# Patient Record
Sex: Male | Born: 1935 | Race: White | Hispanic: No | Marital: Married | Smoking: Former smoker
Health system: Southern US, Community
[De-identification: ages and names within clinical notes are randomized; demographics above are authoritative.]

## PROBLEM LIST (undated history)

## (undated) DIAGNOSIS — E785 Hyperlipidemia, unspecified: Secondary | ICD-10-CM

## (undated) DIAGNOSIS — I509 Heart failure, unspecified: Secondary | ICD-10-CM

## (undated) DIAGNOSIS — E039 Hypothyroidism, unspecified: Secondary | ICD-10-CM

## (undated) DIAGNOSIS — I639 Cerebral infarction, unspecified: Secondary | ICD-10-CM

## (undated) DIAGNOSIS — F0390 Unspecified dementia without behavioral disturbance: Secondary | ICD-10-CM

## (undated) DIAGNOSIS — E079 Disorder of thyroid, unspecified: Secondary | ICD-10-CM

## (undated) DIAGNOSIS — N183 Chronic kidney disease, stage 3 unspecified: Secondary | ICD-10-CM

## (undated) DIAGNOSIS — N289 Disorder of kidney and ureter, unspecified: Secondary | ICD-10-CM

## (undated) DIAGNOSIS — I10 Essential (primary) hypertension: Secondary | ICD-10-CM

## (undated) DIAGNOSIS — I499 Cardiac arrhythmia, unspecified: Secondary | ICD-10-CM

## (undated) DIAGNOSIS — I4819 Other persistent atrial fibrillation: Secondary | ICD-10-CM

## (undated) DIAGNOSIS — I422 Other hypertrophic cardiomyopathy: Secondary | ICD-10-CM

## (undated) DIAGNOSIS — Z9289 Personal history of other medical treatment: Secondary | ICD-10-CM

## (undated) DIAGNOSIS — I5030 Unspecified diastolic (congestive) heart failure: Secondary | ICD-10-CM

## (undated) HISTORY — PX: HERNIA REPAIR: SHX51

---
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
## 2011-10-08 ENCOUNTER — Ambulatory Visit: Payer: Self-pay | Admitting: Surgery

## 2011-10-08 LAB — CBC WITH DIFFERENTIAL/PLATELET
Basophil #: 0.2 10*3/uL — ABNORMAL HIGH (ref 0.0–0.1)
Basophil %: 2.1 %
Eosinophil #: 0.2 10*3/uL (ref 0.0–0.7)
Eosinophil %: 1.6 %
HCT: 41.9 % (ref 40.0–52.0)
HGB: 14.4 g/dL (ref 13.0–18.0)
Lymphocyte #: 1 10*3/uL (ref 1.0–3.6)
Lymphocyte %: 9.1 %
MCV: 95 fL (ref 80–100)
Monocyte %: 6.8 %
Neutrophil #: 8.5 10*3/uL — ABNORMAL HIGH (ref 1.4–6.5)
RBC: 4.43 10*6/uL (ref 4.40–5.90)
RDW: 13.4 % (ref 11.5–14.5)
WBC: 10.6 10*3/uL (ref 3.8–10.6)

## 2011-10-14 ENCOUNTER — Ambulatory Visit: Payer: Self-pay | Admitting: Surgery

## 2012-11-27 ENCOUNTER — Ambulatory Visit: Payer: Self-pay | Admitting: Internal Medicine

## 2013-03-19 ENCOUNTER — Emergency Department: Payer: Self-pay | Admitting: Emergency Medicine

## 2013-03-19 LAB — COMPREHENSIVE METABOLIC PANEL
Alkaline Phosphatase: 77 U/L
Anion Gap: 7 (ref 7–16)
BUN: 15 mg/dL (ref 7–18)
Chloride: 89 mmol/L — ABNORMAL LOW (ref 98–107)
Co2: 27 mmol/L (ref 21–32)
Creatinine: 1.32 mg/dL — ABNORMAL HIGH (ref 0.60–1.30)
EGFR (African American): 59 — ABNORMAL LOW
EGFR (Non-African Amer.): 51 — ABNORMAL LOW
Glucose: 104 mg/dL — ABNORMAL HIGH (ref 65–99)
Osmolality: 249 (ref 275–301)
Sodium: 123 mmol/L — ABNORMAL LOW (ref 136–145)
Total Protein: 7.6 g/dL (ref 6.4–8.2)

## 2013-03-19 LAB — CBC
MCHC: 35.1 g/dL (ref 32.0–36.0)
MCV: 92 fL (ref 80–100)
Platelet: 193 10*3/uL (ref 150–440)
WBC: 6 10*3/uL (ref 3.8–10.6)

## 2013-09-01 ENCOUNTER — Emergency Department: Payer: Self-pay | Admitting: Internal Medicine

## 2013-10-07 DIAGNOSIS — E039 Hypothyroidism, unspecified: Secondary | ICD-10-CM | POA: Insufficient documentation

## 2014-07-12 NOTE — Op Note (Signed)
PATIENT NAME:  Robert Frye, Robert R MR#:  [id number] DATE OF BIRTH:  [birth year]  DATE OF PROCEDURE:  10/14/2011  PREOPERATIVE DIAGNOSIS: Left inguinal hernia.   POSTOPERATIVE DIAGNOSIS: Left inguinal hernia.   PROCEDURE: Left inguinal hernia repair.   SURGEON: Adella HareJ. Wilton Smith, MD  ANESTHESIA: General.   INDICATIONS: This 81 year old male recently has had bulging in the left groin. A left inguinal hernia was demonstrated on physical exam and repair recommended for definitive treatment.   DESCRIPTION OF PROCEDURE: The patient was placed on the operating table in the supine position under general anesthesia. The abdomen was prepared with ChloraPrep, draped in a sterile manner.   A transversely oriented left suprapubic incision was made, carried down through subcutaneous tissues. Several small bleeding points were cauterized. Scarpa's fascia was incised. The external oblique aponeurosis was incised along the course of its fibers to open the external ring and expose the inguinal cord structures. The cord structures were mobilized identifying a direct inguinal hernia. Cremaster fibers were spread. There was no indirect component. A Penrose drain was passed around the cord structures and was retracted laterally. The direct inguinal hernia was further demonstrated. It was protruding up approximately 2 inches. A circumferential dissection was carried out to remove the attenuated transversalis fascia, did not need to be sent for pathological examination. The sac was inverted. The defect was repaired with a row of 0 Surgilon sutures beginning at the pubic tubercle and carrying this up to the internal ring suturing the conjoined tendon to the shelving edge of the inguinal ligament incorporating transversalis fascia into the repair. The last stitch led to satisfactory narrowing of the internal ring. Next, a relaxing incision was made medially in the internal oblique fascia. Next, an onlay Atrium mesh was cut to  create an oval shape of some to 2.8 x 4 cm in dimension. A notch was cut out to straddle the cord structures. The mesh was placed over the repair. It was sutured to the repair with 0 Surgilon, also was sutured to the medial side of the relaxing incision and sutures placed so that the mesh straddled the internal ring. The repair looked good. Hemostasis was intact. The cut edges of the external oblique aponeurosis were closed with a running 4-0 Vicryl. The deep fascia superior and lateral to the repair site was infiltrated with 0.5% Sensorcaine with epinephrine. The subcutaneous tissues were infiltrated. The Scarpa's fascia was closed with interrupted 4-0 Vicryl. Skin was closed with running 5-0 Monocryl subcuticular suture and Dermabond. The patient tolerated surgery satisfactorily and was then moved to the recovery room for postoperative care.   ____________________________ J. Renda RollsWilton Smith, MD [email or web address]:cms D: 10/14/2011 08:46:11 ET T: 10/14/2011 09:04:53 ET JOB#: [id number]  cc: Adella HareJ. Wilton Smith, MD, <Dictator>  Adella HareWILTON J SMITH MD ELECTRONICALLY SIGNED 10/14/2011 19:44

## 2014-09-27 ENCOUNTER — Other Ambulatory Visit: Payer: Self-pay | Admitting: *Deleted

## 2014-09-27 ENCOUNTER — Telehealth: Payer: Self-pay | Admitting: Neurology

## 2014-09-27 NOTE — Telephone Encounter (Signed)
Pt states that he has not heard from the physical therapy as of today and was told told call to let us know if he had not heard. Please call [phone]

## 2014-09-27 NOTE — Telephone Encounter (Signed)
Robert Frye is there another chart for this patient as I so not see no note from  Dr Everlena CooperJaffe

## 2015-05-22 ENCOUNTER — Encounter: Admission: RE | Payer: Self-pay | Source: Ambulatory Visit

## 2015-05-22 ENCOUNTER — Ambulatory Visit: Admission: RE | Admit: 2015-05-22 | Payer: PPO | Source: Ambulatory Visit | Admitting: Unknown Physician Specialty

## 2015-05-22 SURGERY — ESOPHAGOGASTRODUODENOSCOPY (EGD) WITH PROPOFOL
Anesthesia: General
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
## 2017-04-21 DIAGNOSIS — F0390 Unspecified dementia without behavioral disturbance: Secondary | ICD-10-CM | POA: Insufficient documentation

## 2020-02-13 ENCOUNTER — Observation Stay
Admission: EM | Admit: 2020-02-13 | Discharge: 2020-02-15 | Disposition: A | Discharge status: Home / Self Care | Payer: Medicare Other | Attending: Internal Medicine | Admitting: Internal Medicine

## 2020-02-13 ENCOUNTER — Emergency Department: Payer: Medicare Other

## 2020-02-13 ENCOUNTER — Other Ambulatory Visit: Payer: Self-pay

## 2020-02-13 ENCOUNTER — Encounter: Payer: Self-pay | Admitting: Emergency Medicine

## 2020-02-13 DIAGNOSIS — I5021 Acute systolic (congestive) heart failure: Secondary | ICD-10-CM | POA: Diagnosis not present

## 2020-02-13 DIAGNOSIS — M6281 Muscle weakness (generalized): Secondary | ICD-10-CM | POA: Insufficient documentation

## 2020-02-13 DIAGNOSIS — I130 Hypertensive heart and chronic kidney disease with heart failure and stage 1 through stage 4 chronic kidney disease, or unspecified chronic kidney disease: Secondary | ICD-10-CM | POA: Diagnosis not present

## 2020-02-13 DIAGNOSIS — Z20822 Contact with and (suspected) exposure to covid-19: Secondary | ICD-10-CM | POA: Insufficient documentation

## 2020-02-13 DIAGNOSIS — E785 Hyperlipidemia, unspecified: Secondary | ICD-10-CM | POA: Diagnosis present

## 2020-02-13 DIAGNOSIS — R778 Other specified abnormalities of plasma proteins: Secondary | ICD-10-CM | POA: Diagnosis not present

## 2020-02-13 DIAGNOSIS — D6869 Other thrombophilia: Secondary | ICD-10-CM

## 2020-02-13 DIAGNOSIS — Z79899 Other long term (current) drug therapy: Secondary | ICD-10-CM | POA: Insufficient documentation

## 2020-02-13 DIAGNOSIS — I480 Paroxysmal atrial fibrillation: Secondary | ICD-10-CM

## 2020-02-13 DIAGNOSIS — N1832 Chronic kidney disease, stage 3b: Secondary | ICD-10-CM | POA: Diagnosis present

## 2020-02-13 DIAGNOSIS — N1831 Chronic kidney disease, stage 3a: Secondary | ICD-10-CM | POA: Diagnosis not present

## 2020-02-13 DIAGNOSIS — N183 Chronic kidney disease, stage 3 unspecified: Secondary | ICD-10-CM | POA: Diagnosis present

## 2020-02-13 DIAGNOSIS — I509 Heart failure, unspecified: Secondary | ICD-10-CM | POA: Diagnosis not present

## 2020-02-13 DIAGNOSIS — F0390 Unspecified dementia without behavioral disturbance: Secondary | ICD-10-CM | POA: Diagnosis not present

## 2020-02-13 DIAGNOSIS — E876 Hypokalemia: Secondary | ICD-10-CM | POA: Diagnosis present

## 2020-02-13 DIAGNOSIS — I4891 Unspecified atrial fibrillation: Principal | ICD-10-CM | POA: Insufficient documentation

## 2020-02-13 DIAGNOSIS — Z87891 Personal history of nicotine dependence: Secondary | ICD-10-CM | POA: Diagnosis not present

## 2020-02-13 DIAGNOSIS — I5031 Acute diastolic (congestive) heart failure: Secondary | ICD-10-CM

## 2020-02-13 DIAGNOSIS — I10 Essential (primary) hypertension: Secondary | ICD-10-CM

## 2020-02-13 DIAGNOSIS — N179 Acute kidney failure, unspecified: Secondary | ICD-10-CM

## 2020-02-13 DIAGNOSIS — R0602 Shortness of breath: Secondary | ICD-10-CM | POA: Diagnosis present

## 2020-02-13 DIAGNOSIS — N189 Chronic kidney disease, unspecified: Secondary | ICD-10-CM

## 2020-02-13 DIAGNOSIS — I5033 Acute on chronic diastolic (congestive) heart failure: Secondary | ICD-10-CM

## 2020-02-13 HISTORY — DX: Unspecified dementia, unspecified severity, without behavioral disturbance, psychotic disturbance, mood disturbance, and anxiety: F03.90

## 2020-02-13 HISTORY — DX: Hyperlipidemia, unspecified: E78.5

## 2020-02-13 HISTORY — DX: Essential (primary) hypertension: I10

## 2020-02-13 LAB — CBC WITH DIFFERENTIAL/PLATELET
Abs Immature Granulocytes: 0.05 10*3/uL (ref 0.00–0.07)
Basophils Absolute: 0.1 10*3/uL (ref 0.0–0.1)
Basophils Relative: 1 %
Eosinophils Absolute: 0.3 10*3/uL (ref 0.0–0.5)
Eosinophils Relative: 3 %
HCT: 39.8 % (ref 39.0–52.0)
Hemoglobin: 13.7 g/dL (ref 13.0–17.0)
Immature Granulocytes: 1 %
Lymphocytes Relative: 13 %
Lymphs Abs: 1.2 10*3/uL (ref 0.7–4.0)
MCH: 33.3 pg (ref 26.0–34.0)
MCHC: 34.4 g/dL (ref 30.0–36.0)
MCV: 96.8 fL (ref 80.0–100.0)
Monocytes Absolute: 0.8 10*3/uL (ref 0.1–1.0)
Monocytes Relative: 9 %
Neutro Abs: 6.8 10*3/uL (ref 1.7–7.7)
Neutrophils Relative %: 73 %
Platelets: 304 10*3/uL (ref 150–400)
RBC: 4.11 MIL/uL — ABNORMAL LOW (ref 4.22–5.81)
RDW: 13.2 % (ref 11.5–15.5)
WBC: 9.2 10*3/uL (ref 4.0–10.5)
nRBC: 0 % (ref 0.0–0.2)

## 2020-02-13 LAB — TROPONIN I (HIGH SENSITIVITY)
Troponin I (High Sensitivity): 115 ng/L (ref <18)
Troponin I (High Sensitivity): 105 ng/L (ref <18)
Troponin I (High Sensitivity): 114 ng/L (ref <18)
Troponin I (High Sensitivity): 125 ng/L (ref <18)

## 2020-02-13 LAB — RESP PANEL BY RT-PCR (FLU A&B, COVID) ARPGX2
Influenza A by PCR: NEGATIVE
Influenza B by PCR: NEGATIVE
SARS Coronavirus 2 by RT PCR: NEGATIVE

## 2020-02-13 LAB — COMPREHENSIVE METABOLIC PANEL
ALT: 24 U/L (ref 0–44)
AST: 28 U/L (ref 15–41)
Albumin: 4.1 g/dL (ref 3.5–5.0)
Alkaline Phosphatase: 96 U/L (ref 38–126)
Anion gap: 13 (ref 5–15)
BUN: 19 mg/dL (ref 8–23)
CO2: 22 mmol/L (ref 22–32)
Calcium: 9.4 mg/dL (ref 8.9–10.3)
Chloride: 99 mmol/L (ref 98–111)
Creatinine, Ser: 1.39 mg/dL — ABNORMAL HIGH (ref 0.61–1.24)
GFR, Estimated: 49 mL/min — ABNORMAL LOW (ref >60)
Glucose, Bld: 117 mg/dL — ABNORMAL HIGH (ref 70–99)
Potassium: 3.4 mmol/L — ABNORMAL LOW (ref 3.5–5.1)
Sodium: 134 mmol/L — ABNORMAL LOW (ref 135–145)
Total Bilirubin: 2.3 mg/dL — ABNORMAL HIGH (ref 0.3–1.2)
Total Protein: 7.4 g/dL (ref 6.5–8.1)

## 2020-02-13 LAB — MAGNESIUM: Magnesium: 2.1 mg/dL (ref 1.7–2.4)

## 2020-02-13 LAB — APTT: aPTT: 33 seconds (ref 24–36)

## 2020-02-13 LAB — PROTIME-INR
INR: 1.1 (ref 0.8–1.2)
Prothrombin Time: 14.2 seconds (ref 11.4–15.2)

## 2020-02-13 LAB — BRAIN NATRIURETIC PEPTIDE: B Natriuretic Peptide: 1073.4 pg/mL — ABNORMAL HIGH (ref 0.0–100.0)

## 2020-02-13 LAB — HEPARIN LEVEL (UNFRACTIONATED): Heparin Unfractionated: 0.81 IU/mL — ABNORMAL HIGH (ref 0.30–0.70)

## 2020-02-13 MED ORDER — LEVOTHYROXINE SODIUM 50 MCG PO TABS
75.0000 ug | ORAL_TABLET | Freq: Every day | ORAL | Status: DC
  Administered 2020-02-14 – 2020-02-15 (×2): 75 ug via ORAL
  Filled 2020-02-13 (×2): qty 1

## 2020-02-13 MED ORDER — MORPHINE SULFATE (PF) 2 MG/ML IV SOLN: 0.5000 mg | INTRAVENOUS | Status: DC | PRN

## 2020-02-13 MED ORDER — ATORVASTATIN CALCIUM 10 MG PO TABS
10.0000 mg | ORAL_TABLET | Freq: Every day | ORAL | Status: DC
  Administered 2020-02-13 – 2020-02-15 (×3): 10 mg via ORAL
  Filled 2020-02-13 (×3): qty 1

## 2020-02-13 MED ORDER — ASPIRIN EC 81 MG PO TBEC
81.0000 mg | DELAYED_RELEASE_TABLET | Freq: Every day | ORAL | Status: DC
Start: 2020-02-13 — End: 2020-02-13

## 2020-02-13 MED ORDER — ENOXAPARIN SODIUM 40 MG/0.4ML ~~LOC~~ SOLN
40.0000 mg | SUBCUTANEOUS | Status: DC
  Administered 2020-02-13: 40 mg via SUBCUTANEOUS
  Filled 2020-02-13: qty 0.4

## 2020-02-13 MED ORDER — VITAMIN B-12 1000 MCG PO TABS
1000.0000 ug | ORAL_TABLET | ORAL | Status: DC
  Filled 2020-02-13: qty 1

## 2020-02-13 MED ORDER — BENAZEPRIL HCL 10 MG PO TABS
10.0000 mg | ORAL_TABLET | Freq: Every day | ORAL | Status: DC
  Administered 2020-02-13 – 2020-02-15 (×3): 10 mg via ORAL
  Filled 2020-02-13 (×3): qty 1

## 2020-02-13 MED ORDER — AMLODIPINE BESY-BENAZEPRIL HCL 5-10 MG PO CAPS: 1.0000 | ORAL_CAPSULE | Freq: Every day | ORAL | Status: DC

## 2020-02-13 MED ORDER — DM-GUAIFENESIN ER 30-600 MG PO TB12
1.0000 | ORAL_TABLET | Freq: Two times a day (BID) | ORAL | Status: DC | PRN
  Administered 2020-02-14: 1 via ORAL
  Filled 2020-02-13: qty 1

## 2020-02-13 MED ORDER — ACETAMINOPHEN 325 MG PO TABS: 650.0000 mg | ORAL_TABLET | Freq: Four times a day (QID) | ORAL | Status: DC | PRN

## 2020-02-13 MED ORDER — FUROSEMIDE 10 MG/ML IJ SOLN
20.0000 mg | Freq: Two times a day (BID) | INTRAMUSCULAR | Status: DC
  Administered 2020-02-13 – 2020-02-14 (×2): 20 mg via INTRAVENOUS
  Filled 2020-02-13 (×2): qty 2

## 2020-02-13 MED ORDER — HEPARIN BOLUS VIA INFUSION
4500.0000 [IU] | Freq: Once | INTRAVENOUS | Status: DC
  Filled 2020-02-13: qty 4500

## 2020-02-13 MED ORDER — HEPARIN (PORCINE) 25000 UT/250ML-% IV SOLN: 1050.0000 [IU]/h | INTRAVENOUS | Status: DC

## 2020-02-13 MED ORDER — FUROSEMIDE 10 MG/ML IJ SOLN
20.0000 mg | Freq: Once | INTRAMUSCULAR | Status: DC
Start: 2020-02-13 — End: 2020-02-13

## 2020-02-13 MED ORDER — AMLODIPINE BESYLATE 5 MG PO TABS
5.0000 mg | ORAL_TABLET | Freq: Every day | ORAL | Status: DC
  Administered 2020-02-13 – 2020-02-15 (×3): 5 mg via ORAL
  Filled 2020-02-13 (×3): qty 1

## 2020-02-13 MED ORDER — ALBUTEROL SULFATE HFA 108 (90 BASE) MCG/ACT IN AERS
2.0000 | INHALATION_SPRAY | RESPIRATORY_TRACT | Status: DC | PRN
  Filled 2020-02-13: qty 6.7

## 2020-02-13 MED ORDER — HYDRALAZINE HCL 20 MG/ML IJ SOLN: 5.0000 mg | INTRAMUSCULAR | Status: DC | PRN

## 2020-02-13 MED ORDER — HEPARIN (PORCINE) 25000 UT/250ML-% IV SOLN
950.0000 [IU]/h | INTRAVENOUS | Status: AC
  Administered 2020-02-13 (×2): 1050 [IU]/h via INTRAVENOUS
  Administered 2020-02-14: 950 [IU]/h via INTRAVENOUS
  Filled 2020-02-13 (×2): qty 250

## 2020-02-13 MED ORDER — ONDANSETRON HCL 4 MG/2ML IJ SOLN: 4.0000 mg | Freq: Three times a day (TID) | INTRAMUSCULAR | Status: DC | PRN

## 2020-02-13 MED ORDER — ASPIRIN 81 MG PO CHEW
324.0000 mg | CHEWABLE_TABLET | Freq: Once | ORAL | Status: AC
  Administered 2020-02-13: 324 mg via ORAL
  Filled 2020-02-13: qty 4

## 2020-02-13 MED ORDER — POTASSIUM CHLORIDE CRYS ER 20 MEQ PO TBCR
40.0000 meq | EXTENDED_RELEASE_TABLET | Freq: Once | ORAL | Status: AC
  Administered 2020-02-13: 40 meq via ORAL
  Filled 2020-02-13: qty 2

## 2020-02-13 MED ORDER — METOPROLOL TARTRATE 25 MG PO TABS
25.0000 mg | ORAL_TABLET | Freq: Two times a day (BID) | ORAL | Status: DC
  Administered 2020-02-13 – 2020-02-15 (×5): 25 mg via ORAL
  Filled 2020-02-13 (×5): qty 1

## 2020-02-13 MED ORDER — ASPIRIN EC 81 MG PO TBEC
81.0000 mg | DELAYED_RELEASE_TABLET | Freq: Every day | ORAL | Status: DC
  Administered 2020-02-14 – 2020-02-15 (×2): 81 mg via ORAL
  Filled 2020-02-13 (×2): qty 1

## 2020-02-13 MED ORDER — FUROSEMIDE 10 MG/ML IJ SOLN
20.0000 mg | Freq: Once | INTRAMUSCULAR | Status: AC
  Administered 2020-02-13: 20 mg via INTRAVENOUS
  Filled 2020-02-13: qty 4

## 2020-02-13 MED ORDER — ASPIRIN EC 81 MG PO TBEC: 81.0000 mg | DELAYED_RELEASE_TABLET | Freq: Every day | ORAL | Status: DC

## 2020-02-13 MED ORDER — NITROGLYCERIN 0.4 MG SL SUBL: 0.4000 mg | SUBLINGUAL_TABLET | SUBLINGUAL | Status: DC | PRN

## 2020-02-13 MED ORDER — VITAMIN D 25 MCG (1000 UNIT) PO TABS
2000.0000 [IU] | ORAL_TABLET | Freq: Every day | ORAL | Status: DC
  Administered 2020-02-13 – 2020-02-15 (×3): 2000 [IU] via ORAL
  Filled 2020-02-13 (×3): qty 2

## 2020-02-13 NOTE — H&P (Addendum)
History and Physical    Robert Frye QBH:[id number] DOB: 1931/11/13 DOA: 02/13/2020  Referring MD/NP/PA:   PCP: Gracelyn Nurse, MD   Patient coming from:  The patient is coming from home.  At baseline, pt is independent for most of ADL.        Chief Complaint: Shortness of breath and cough  HPI: Robert Frye is a 86 y.o. male with medical history significant of hypertension, hyperlipidemia, mild dementia, CKD stage III, who presents with shortness breath and cough.  Patient states that he has been having mild dry cough for more than 1 month.  He developed shortness breath since yesterday morning, which has been progressively worsening.  Denies chest pain, fever or chills.  Patient nausea, no vomiting, diarrhea or abdominal pain.  No symptoms of UTI.  No unilateral weakness.  No recent fall, no rectal bleeding or dark stool. Patient was found to have new onset atrial fibrillation with heart rate 116 --> 90s in ED.   ED Course: pt was found to have BNP 1073, troponin level 15, WBC 9.2, negative Covid PCR, stable renal function, potassium 3.4, temperature normal, blood pressure 122/79, RR 26, 19, oxygen saturation 96% on room air.  Chest x-ray showed mild left basilar opacity.  Patient is placed on progressive benefit observation.  Cardiology, Dr. Duke Salvia is consulted.   Review of Systems:   General: no fevers, chills, no body weight gain,  has fatigue HEENT: no blurry vision, hearing changes or sore throat Respiratory: has dyspnea, coughing, no wheezing CV: no chest pain, no palpitations GI: no nausea, vomiting, abdominal pain, diarrhea, constipation GU: no dysuria, burning on urination, increased urinary frequency, hematuria  Ext: has leg edema Neuro: no unilateral weakness, numbness, or tingling, no vision change or hearing loss Skin: no rash, no skin tear. MSK: No muscle spasm, no deformity, no limitation of range of movement in spin Heme: No easy bruising.  Travel history:  No recent long distant travel.  Allergy: No Known Allergies  Past Medical History:  Diagnosis Date  . Dementia (HCC)   . Hyperlipidemia   . Hypertension   . Renal insufficiency   . Thyroid disease     History reviewed. No pertinent surgical history.  Social History:  reports that he has quit smoking. He has never used smokeless tobacco. He reports previous alcohol use. He reports that he does not use drugs.  Family History:  Family History  Problem Relation Age of Onset  . Heart attack Father   . Heart disease Sister   . Heart disease Brother      Prior to Admission medications   Not on File    Physical Exam: Vitals:   02/13/20 1100 02/13/20 1130 02/13/20 1200 02/13/20 1230  BP: (!) 150/84 126/82 138/84 (!) 149/88  Pulse: 95 (!) 58 (!) 50 96  Resp: (!) 22 18 (!) 24 16  Temp:      TempSrc:      SpO2: 95% 95% (!) 88% 96%  Weight:      Height:       General: Not in acute distress HEENT:       Eyes: PERRL, EOMI, no scleral icterus.       ENT: No discharge from the ears and nose, no pharynx injection, no tonsillar enlargement.        Neck: no JVD, no bruit, no mass felt. Heme: No neck lymph node enlargement. Cardiac: S1/S2, RRR, No murmurs, No gallops or rubs. Respiratory:  No rales, wheezing,  rhonchi or rubs. GI: Soft, nondistended, nontender, no rebound pain, no organomegaly, BS present. GU: No hematuria Ext: 1+ pitting leg edema bilaterally. 1+DP/PT pulse bilaterally. Musculoskeletal: No joint deformities, No joint redness or warmth, no limitation of ROM in spin. Skin: No rashes.  Neuro: Alert, oriented X3, cranial nerves II-XII grossly intact, moves all extremities normally. Psych: Patient is not psychotic, no suicidal or hemocidal ideation.  Labs on Admission: I have personally reviewed following labs and imaging studies  CBC: Recent Labs  Lab 02/13/20 0735  WBC 9.2  NEUTROABS 6.8  HGB 13.7  HCT 39.8  MCV 96.8  PLT 304   Basic Metabolic  Panel: Recent Labs  Lab 02/13/20 0735 02/13/20 0959  NA 134*  --   K 3.4*  --   CL 99  --   CO2 22  --   GLUCOSE 117*  --   BUN 19  --   CREATININE 1.39*  --   CALCIUM 9.4  --   MG  --  2.1   GFR: Estimated Creatinine Clearance: 35.7 mL/min (A) (by C-G formula based on SCr of 1.39 mg/dL (H)). Liver Function Tests: Recent Labs  Lab 02/13/20 0735  AST 28  ALT 24  ALKPHOS 96  BILITOT 2.3*  PROT 7.4  ALBUMIN 4.1   No results for input(s): LIPASE, AMYLASE in the last 168 hours. No results for input(s): AMMONIA in the last 168 hours. Coagulation Profile: No results for input(s): INR, PROTIME in the last 168 hours. Cardiac Enzymes: No results for input(s): CKTOTAL, CKMB, CKMBINDEX, TROPONINI in the last 168 hours. BNP (last 3 results) No results for input(s): PROBNP in the last 8760 hours. HbA1C: No results for input(s): HGBA1C in the last 72 hours. CBG: No results for input(s): GLUCAP in the last 168 hours. Lipid Profile: No results for input(s): CHOL, HDL, LDLCALC, TRIG, CHOLHDL, LDLDIRECT in the last 72 hours. Thyroid Function Tests: No results for input(s): TSH, T4TOTAL, FREET4, T3FREE, THYROIDAB in the last 72 hours. Anemia Panel: No results for input(s): VITAMINB12, FOLATE, FERRITIN, TIBC, IRON, RETICCTPCT in the last 72 hours. Urine analysis: No results found for: COLORURINE, APPEARANCEUR, LABSPEC, PHURINE, GLUCOSEU, HGBUR, BILIRUBINUR, KETONESUR, PROTEINUR, UROBILINOGEN, NITRITE, LEUKOCYTESUR Sepsis Labs: @LABRCNTIP (procalcitonin:4,lacticidven:4) ) Recent Results (from the past 240 hour(s))  Resp Panel by RT-PCR (Flu A&B, Covid) Nasopharyngeal Swab     Status: None   Collection Time: 02/13/20  8:35 AM   Specimen: Nasopharyngeal Swab; Nasopharyngeal(NP) swabs in vial transport medium  Result Value Ref Range Status   SARS Coronavirus 2 by RT PCR NEGATIVE NEGATIVE Final    Comment: (NOTE) SARS-CoV-2 target nucleic acids are NOT DETECTED.  The SARS-CoV-2 RNA  is generally detectable in upper respiratory specimens during the acute phase of infection. The lowest concentration of SARS-CoV-2 viral copies this assay can detect is 138 copies/mL. A negative result does not preclude SARS-Cov-2 infection and should not be used as the sole basis for treatment or other patient management decisions. A negative result may occur with  improper specimen collection/handling, submission of specimen other than nasopharyngeal swab, presence of viral mutation(s) within the areas targeted by this assay, and inadequate number of viral copies(<138 copies/mL). A negative result must be combined with clinical observations, patient history, and epidemiological information. The expected result is Negative.  Fact Sheet for Patients:  BloggerCourse.comhttps://www.fda.gov/media/152166/download  Fact Sheet for Healthcare Providers:  SeriousBroker.ithttps://www.fda.gov/media/152162/download  This test is no t yet approved or cleared by the [place] FDA and  has been authorized for detection and/or  diagnosis of SARS-CoV-2 by FDA under an Emergency Use Authorization (EUA). This EUA will remain  in effect (meaning this test can be used) for the duration of the COVID-19 declaration under Section 564(b)(1) of the Act, 21 U.S.C.section 360bbb-3(b)(1), unless the authorization is terminated  or revoked sooner.       Influenza A by PCR NEGATIVE NEGATIVE Final   Influenza B by PCR NEGATIVE NEGATIVE Final    Comment: (NOTE) The Xpert Xpress SARS-CoV-2/FLU/RSV plus assay is intended as an aid in the diagnosis of influenza from Nasopharyngeal swab specimens and should not be used as a sole basis for treatment. Nasal washings and aspirates are unacceptable for Xpert Xpress SARS-CoV-2/FLU/RSV testing.  Fact Sheet for Patients: BloggerCourse.com  Fact Sheet for Healthcare Providers: SeriousBroker.it  This test is not yet approved or cleared by the  [place] FDA and has been authorized for detection and/or diagnosis of SARS-CoV-2 by FDA under an Emergency Use Authorization (EUA). This EUA will remain in effect (meaning this test can be used) for the duration of the COVID-19 declaration under Section 564(b)(1) of the Act, 21 U.S.C. section 360bbb-3(b)(1), unless the authorization is terminated or revoked.  Performed at [facility], [address] [place], [place] [address]      Radiological Exams on Admission: DG Chest 2 View  Result Date: 02/13/2020 CLINICAL DATA:  Shortness of breath since yesterday. EXAM: CHEST - 2 VIEW COMPARISON:  None. FINDINGS: Lungs are adequately inflated with mild left basilar opacification which may be due to small effusion with atelectasis although infection is possible. Remainder of the lungs are clear. Cardiomediastinal silhouette is normal. Degenerative change of the spine. IMPRESSION: Mild left basilar opacification which may be due to small effusion with atelectasis, although infection is possible. Electronically Signed   By: Elberta Fortis M.D.   On: 02/13/2020 08:56     EKG: I have personally reviewed.  Atrial fibrillation, QTc 487, low voltage, T wave inversion in V3-V6, and in lateral leads.  Assessment/Plan Principal Problem:   Acute CHF (congestive heart failure) (HCC) Active Problems:   Atrial fibrillation with RVR (HCC)   Hypokalemia   Elevated troponin   HTN (hypertension)   HLD (hyperlipidemia)   CKD (chronic kidney disease), stage IIIa   Acute CHF (congestive heart failure) [facility]): Patient has 1+ leg edema, shortness of breath, elevated BNP 1073, clinically consistent with acute CHF.  No 2D echo on record, not sure which type of CHF.  Patient has a long history of hypertension, indicating possible diastolic CHF. Dr. Duke Salvia of cardiology is consulted.  -Place to progressive unit for observation -Lasix 20 mg bid by IV -2d echo -Daily weights -strict I/O's -Low  salt diet -Fluid restriction -Obtain REDs Vest reading  New onset Atrial fibrillation with RVR (HCC): HR 116 -->90. CHA2DS2-VASc Score is 4, needs oral anticoagulation.  Will follow up cardiologist recommendation about anticoagulant use. -Cardiac monitoring -Metoprolol started tachycardia -IV heparin started by card  Elevated troponin: trop 115 -->105.  No chest pain, likely due to demand ischemia secondary to CHF and A. Fib -Trend troponin -Aspirin and lipitor -Check A1c, FLP -Follow-up 2D echo  Hypokalemia: K= 3.4  on admission. - Repleted - Check Mg level  HTN (hypertension) -Hold HCTZ since pt will be on lasix IV -New amlodipine-benazepril -IV hydralazine as needed  -HLD (hyperlipidemia) -Lipitor  CKD (chronic kidney disease), stage IIIa: Stable.  Baseline creatinine 1.32 on 03/19/2013.  His creatinine is 1.39, BUN 19. -Follow-up by BMP  DVT ppx: SQ Lovenox Code Status: Partial code (I discussed with the patient in the presence of his wife, and explained the meaning of CODE STATUS, patient wants to be partial code, OK for CPR, but no intubation). Family Communication:  Yes, patient's wife at bed side Disposition Plan:  Anticipate discharge back to previous environment Consults called:  Dr. Duke Salvia of card Admission status: progressive unit for obs     Status is: Observation  The patient remains OBS appropriate and will d/c before 2 midnights.  Dispo: The patient is from: Home              Anticipated d/c is to: Home              Anticipated d/c date is: 1 day              Patient currently is not medically stable to d/c.           Date of Service 02/13/2020    Lorretta Harp [facility]   If 7PM-7AM, please contact night-coverage [email or web address] 02/13/2020, 12:59 PM

## 2020-02-13 NOTE — Progress Notes (Signed)
Pt found in the hallway looking for his room, he states he was in the bathroom and couldn't remember what room he was in. Pt ambulating independently and steady in the hall way, redirected to his room by RN. Pt placed back in bed with bed alarm on. Pt lost IV access during this, heparin stopped briefly while new IV is being placed.

## 2020-02-13 NOTE — Progress Notes (Signed)
ANTICOAGULATION CONSULT NOTE - Initial Consult  Pharmacy Consult for heparin infusion dosing and monitoring   Indication: atrial fibrillation  No Known Allergies  Patient Measurements: Height: 5\' 7"  (170.2 cm) Weight: 75.2 kg (165 lb 12.8 oz) IBW/kg (Calculated) : 66.1 Heparin Dosing Weight: 72.6  Vital Signs: Temp: 97.7 F (36.5 C) (11/21 2022) Temp Source: Oral (11/21 2022) BP: 128/78 (11/21 2022) Pulse Rate: 78 (11/21 2022)  Labs: Recent Labs    02/13/20 0735 02/13/20 0735 02/13/20 0959 02/13/20 1339 02/13/20 1640 02/13/20 2210  HGB 13.7  --   --   --   --   --   HCT 39.8  --   --   --   --   --   PLT 304  --   --   --   --   --   APTT  --   --   --  33  --   --   LABPROT  --   --   --  14.2  --   --   INR  --   --   --  1.1  --   --   HEPARINUNFRC  --   --   --   --   --  0.81*  CREATININE 1.39*  --   --   --   --   --   TROPONINIHS 115*   < > 105* 114* 125*  --    < > = values in this interval not displayed.    Estimated Creatinine Clearance: 35.7 mL/min (A) (by C-G formula based on SCr of 1.39 mg/dL (H)).   Medical History: Past Medical History:  Diagnosis Date  . Dementia (HCC)   . Hyperlipidemia   . Hypertension   . Renal insufficiency   . Thyroid disease     Assessment: 86 yo male admitted with acute CHF and new onset A. Fib w/RVR. Patient has past medical history HTN, HLD, and CKD. Pharmacy has been consulted for heparin infusion dosing and monitoring. Patient did received 1 dose on enoxaparin 40mg  @ 1247   Goal of Therapy:  Heparin level 0.3-0.7 units/ml Monitor platelets by anticoagulation protocol: Yes   Plan:  11/21:  HL @ 2210 = 0.81 HL is slightly elevated but most likely due to lovenox 40 mg given on 11/21 @ 1247 and decreased renal function.   Will continue pt on current rate and recheck HL in 6 hrs on 11/22 @ 0400.   12/21, PharmD Clinical Pharmacist 02/13/2020 10:48 PM

## 2020-02-13 NOTE — ED Notes (Signed)
Advised nurse that patient has assigned bed 

## 2020-02-13 NOTE — ED Provider Notes (Signed)
[facility] Emergency Department Provider Note ____________________________________________   First MD Initiated Contact with Patient 02/13/20 [phone]     (approximate)  I have reviewed the triage vital signs and the nursing notes.   HISTORY  Chief Complaint Shortness of Breath  Level 5 caveat: History present illness limited due to dementia  HPI Robert Frye is a 86 y.o. male with PMH as noted below who presents with shortness of breath, acute onset yesterday morning when he awoke from sleep, and occurring again this morning.  Today's episode was more severe than yesterday.  It has now resolved.  The patient and wife state that he was feeling better during the day yesterday.  He has had a nonproductive cough for about a month.  He denies any associated chest pain, fever, vomiting, weakness, or other acute symptoms.  Past Medical History:  Diagnosis Date  . Dementia (HCC)   . Hyperlipidemia   . Hypertension   . Renal insufficiency   . Thyroid disease     Patient Active Problem List   Diagnosis Date Noted  . Atrial fibrillation with RVR (HCC) 02/13/2020  . Hypokalemia 02/13/2020  . Elevated troponin 02/13/2020  . HTN (hypertension) 02/13/2020  . HLD (hyperlipidemia) 02/13/2020  . CKD (chronic kidney disease), stage IIIa 02/13/2020    History reviewed. No pertinent surgical history.  Prior to Admission medications   Not on File    Allergies Patient has no known allergies.  No family history on file.  Social History Social History   Tobacco Use  . Smoking status: Former Games developer  . Smokeless tobacco: Never Used  Vaping Use  . Vaping Use: Never used  Substance Use Topics  . Alcohol use: Not on file  . Drug use: Not on file    Review of Systems Level 5 caveat: Review of systems limited due to dementia Constitutional: No fever/chills Cardiovascular: Denies chest pain. Respiratory: Positive for resolved shortness of  breath. Gastrointestinal: No vomiting. Musculoskeletal: Negative for back pain. Neurological: Negative for headache.   ____________________________________________   PHYSICAL EXAM:  VITAL SIGNS: ED Triage Vitals  Enc Vitals Group     BP 02/13/20 0723 (!) 141/83     Pulse Rate 02/13/20 0723 94     Resp 02/13/20 0723 (!) 24     Temp 02/13/20 0723 (!) 97.4 F (36.3 C)     Temp Source 02/13/20 0723 Oral     SpO2 02/13/20 0723 98 %     Weight 02/13/20 0725 160 lb (72.6 kg)     Height 02/13/20 0725 5\' 7"  (1.702 m)     Head Circumference --      Peak Flow --      Pain Score 02/13/20 0725 0     Pain Loc --      Pain Edu? --      Excl. in GC? --     Constitutional: Alert and oriented.  Comfortable appearing, in no acute distress. Eyes: Conjunctivae are normal.  Head: Atraumatic. Nose: No congestion/rhinnorhea. Mouth/Throat: Mucous membranes are moist.   Neck: Normal range of motion.  Cardiovascular: Normal rate, regular rhythm. Grossly normal heart sounds.  Good peripheral circulation. Respiratory: Normal respiratory effort.  No retractions. Lungs CTAB. Gastrointestinal: No distention.  Musculoskeletal: No lower extremity edema.  Extremities warm and well perfused.  Neurologic:  Normal speech and language. No gross focal neurologic deficits are appreciated.  Skin:  Skin is warm and dry. No rash noted. Psychiatric: Mood and affect are normal. Speech  and behavior are normal.  ____________________________________________   LABS (all labs ordered are listed, but only abnormal results are displayed)  Labs Reviewed  COMPREHENSIVE METABOLIC PANEL - Abnormal; Notable for the following components:      Result Value   Sodium 134 (*)    Potassium 3.4 (*)    Glucose, Bld 117 (*)    Creatinine, Ser 1.39 (*)    Total Bilirubin 2.3 (*)    GFR, Estimated 49 (*)    All other components within normal limits  CBC WITH DIFFERENTIAL/PLATELET - Abnormal; Notable for the following  components:   RBC 4.11 (*)    All other components within normal limits  BRAIN NATRIURETIC PEPTIDE - Abnormal; Notable for the following components:   B Natriuretic Peptide 1,073.4 (*)    All other components within normal limits  TROPONIN I (HIGH SENSITIVITY) - Abnormal; Notable for the following components:   Troponin I (High Sensitivity) 115 (*)    All other components within normal limits  RESP PANEL BY RT-PCR (FLU A&B, COVID) ARPGX2  MAGNESIUM  TROPONIN I (HIGH SENSITIVITY)   ____________________________________________  EKG  ED ECG REPORT I, Dionne Bucy, the attending physician, personally viewed and interpreted this ECG.  Date: 02/13/2020 EKG Time: 07 30 Rate: 104 Rhythm: Atrial fibrillation QRS Axis: Borderline left axis Intervals: normal ST/T Wave abnormalities: Repolarization abnormality Narrative Interpretation: no evidence of acute ischemia   ED ECG REPORT I, Dionne Bucy, the attending physician, personally viewed and interpreted this ECG.  Date: 02/13/2020 EKG Time: 08 34 Rate: 104 Rhythm: Atrial fibrillation QRS Axis: normal Intervals: normal ST/T Wave abnormalities: Repolarization abnormality Narrative Interpretation: no evidence of acute ischemia   ____________________________________________  RADIOLOGY  CXR interpreted by me shows left basilar opacity, possible small effusion vs atelectasis  ____________________________________________   PROCEDURES  Procedure(s) performed: No  Procedures  Critical Care performed: No ____________________________________________   INITIAL IMPRESSION / ASSESSMENT AND PLAN / ED COURSE  Pertinent labs & imaging results that were available during my care of the patient were reviewed by me and considered in my medical decision making (see chart for details).  86 year old male with PMH of hypertension, hyperlipidemia, renal insufficiency, and dementia presents with 2 episodes of shortness of  breath, yesterday morning when he awoke, and again this morning.  He has no chest pain or acute associated symptoms, however he reports a cough for about 1 month.  He is vaccinated for Covid.  On exam, the patient is overall well-appearing for his age.  His vital signs are normal except for borderline tachypnea.  O2 saturation is in the high 90s on room air.  Lungs are clear although the patient does have an active dry cough during my exam.  There is no peripheral edema.  Initial EKG appears to show atrial fibrillation although the baseline is somewhat unclear.  The patient has no documented history of atrial fibrillation and that I can see in his past records in care everywhere.  Differential is broad but includes new onset atrial fibrillation, bronchitis, pneumonia, COVID-19, CHF, COPD.  We will obtain a chest x-ray, lab work-up, repeat EKG, and reassess.  ----------------------------------------- 10:23 AM on 02/13/2020 -----------------------------------------  Repeat EKG confirms atrial fibrillation, which is new onset for the patient.  Troponin is elevated.  Based on these findings, I recommend admission for cardiac workup, and the patient and spouse agree.  I discussed the case with Dr. Clyde Lundborg from the hospitalist service for admission.    ____________________________________________   FINAL CLINICAL IMPRESSION(S) /  ED DIAGNOSES  Final diagnoses:  Atrial fibrillation, unspecified type (HCC)      NEW MEDICATIONS STARTED DURING THIS VISIT:  New Prescriptions   No medications on file     Note:  This document was prepared using Dragon voice recognition software and may include unintentional dictation errors.    Dionne Bucy, MD 02/13/20 1025

## 2020-02-13 NOTE — Consult Note (Signed)
Cardiology Consultation:   Patient ID: Robert PitcherJerry R Sherburn MRN: [id number]; DOB: 08/30/1931  Admit date: 02/13/2020 Date of Consult: 02/13/2020  Primary Care Provider: Gracelyn NurseJohnston, John D, MD [facility] Cardiologist: No primary care provider on file. new [facility] Electrophysiologist:  None    Patient Profile:   Robert Frye is a 86 y.o. male with a hx of hypertension, hyperlipidemia, CKD 3, and mild dementia who is being seen today for the evaluation of new onset heart failure at the request of Dr. Fayrene FearingXiu.  History of Present Illness:   [name] presented to the ED with 1 month of cough and new onset shortness of breath.  His wife first noted that he was more short of breath in the last couple of days.  He denies any orthopnea or PND.  However he has had exertional dyspnea and noted that his belly was getting distended.  His cough has been nonproductive and he denies any fevers or chills.  They note that they mostly order their food and do not cook much.  He does not weigh himself regularly.  He has not noted any palpitations, lightheadedness, or dizziness.  In the ED he was noted to be in atrial fibrillation with rates in the 90s to 110s.  BNP was elevated to 1073.  High-sensitivity troponin was elevated to 115-->105.  EKG revealed atrial fibrillation at 104 bpm.  There were anterolateral T wave inversions.COVID-19 and influenza were negative.  Labs were other was notable for normal blood counts and creatinine 1.39.  Chest x-ray revealed mild left basilar opacification consistent with atelectasis versus infection and a small effusion.   Past Medical History:  Diagnosis Date  . Dementia (HCC)   . Hyperlipidemia   . Hypertension   . Renal insufficiency   . Thyroid disease     History reviewed. No pertinent surgical history.   Home Medications:  Prior to Admission medications   Medication Sig Start Date End Date Taking? Authorizing Provider  amLODipine-benazepril (LOTREL) 5-10  MG capsule Take 1 capsule by mouth daily. 10/21/19 10/20/20 Yes [provider]  aspirin 81 MG EC tablet Take 81 mg by mouth daily.   Yes [provider]  atorvastatin (LIPITOR) 10 MG tablet Take 10 mg by mouth daily. 11/17/14  Yes [provider]  Cholecalciferol 50 MCG (2000 UT) CAPS Take 2,000 Units by mouth daily.   Yes [provider]  cyanocobalamin 1000 MCG tablet Take 1,000 mcg by mouth daily.   Yes [provider]  hydrochlorothiazide (HYDRODIURIL) 12.5 MG tablet Take 12.5 mg by mouth daily as needed for edema. 08/26/19  Yes [provider]  levothyroxine (SYNTHROID) 75 MCG tablet Take 75 mcg by mouth daily before breakfast. 11/12/14  Yes [provider]    Inpatient Medications: Scheduled Meds: . amLODipine  5 mg Oral Daily   And  . benazepril  10 mg Oral Daily  . [START ON 02/14/2020] aspirin EC  81 mg Oral Daily  . atorvastatin  10 mg Oral Daily  . Cholecalciferol  2,000 Units Oral Daily  . enoxaparin (LOVENOX) injection  40 mg Subcutaneous Q24H  . [START ON 02/14/2020] furosemide  20 mg Intravenous Q12H  . [START ON 02/14/2020] levothyroxine  75 mcg Oral QAC breakfast  . metoprolol tartrate  25 mg Oral BID  . cyanocobalamin  1,000 mcg Oral Q3 days   Continuous Infusions:  PRN Meds: acetaminophen, albuterol, dextromethorphan-guaiFENesin, hydrALAZINE, morphine injection, nitroGLYCERIN, ondansetron (ZOFRAN) IV  Allergies:   No Known Allergies  Social History:   Social History   Socioeconomic History  . Marital status: Married    Spouse name: Not on file  . Number of children: Not on file  . Years of education: Not on file  . Highest education level: Not on file  Occupational History  . Not on file  Tobacco Use  . Smoking status: Former Games developer  . Smokeless tobacco: Never Used  Vaping Use  . Vaping Use: Never used  Substance and Sexual Activity  . Alcohol use: Not Currently  . Drug use: Never  . Sexual  activity: Not on file  Other Topics Concern  . Not on file  Social History Narrative  . Not on file   Social Determinants of Health   Financial Resource Strain:   . Difficulty of Paying Living Expenses: Not on file  Food Insecurity:   . Worried About Programme researcher, broadcasting/film/video in the Last Year: Not on file  . Ran Out of Food in the Last Year: Not on file  Transportation Needs:   . Lack of Transportation (Medical): Not on file  . Lack of Transportation (Non-Medical): Not on file  Physical Activity:   . Days of Exercise per Week: Not on file  . Minutes of Exercise per Session: Not on file  Stress:   . Feeling of Stress : Not on file  Social Connections:   . Frequency of Communication with Friends and Family: Not on file  . Frequency of Social Gatherings with Friends and Family: Not on file  . Attends Religious Services: Not on file  . Active Member of Clubs or Organizations: Not on file  . Attends Banker Meetings: Not on file  . Marital Status: Not on file  Intimate Partner Violence:   . Fear of Current or Ex-Partner: Not on file  . Emotionally Abused: Not on file  . Physically Abused: Not on file  . Sexually Abused: Not on file    Family History:    Family History  Problem Relation Age of Onset  . Heart attack Father   . Heart disease Sister   . Heart disease Brother      ROS:  Please see the history of present illness.   All other ROS reviewed and negative.     Physical Exam/Data:   Vitals:   02/13/20 1100 02/13/20 1130 02/13/20 1200 02/13/20 1230  BP: (!) 150/84 126/82 138/84 (!) 149/88  Pulse: 95 (!) 58 (!) 50 96  Resp: (!) 22 18 (!) 24 16  Temp:      TempSrc:      SpO2: 95% 95% (!) 88% 96%  Weight:      Height:        Intake/Output Summary (Last 24 hours) at 02/13/2020 1314 Last data filed at 02/13/2020 1202 Gross per 24 hour  Intake --  Output 50 ml  Net -50 ml   Last 3 Weights 02/13/2020  Weight (lbs) 160 lb  Weight (kg) 72.576 kg      VS:  BP (!) 149/88   Pulse 96   Temp (!) 97.4 F (36.3 C) (Oral)   Resp 16   Ht 5\' 7"  (1.702 m)   Wt 72.6 kg   SpO2 96%   BMI 25.06 kg/m  , BMI Body mass index is 25.06 kg/m. GENERAL:  Well appearing HEENT: Pupils equal round and reactive, fundi not visualized, oral mucosa unremarkable NECK:  + jugular venous distention to mandible at 45 degrees, waveform within normal limits, carotid  upstroke brisk and symmetric, no bruits LUNGS:  Clear to auscultation bilaterally HEART: Irregularly irregular.  PMI not displaced or sustained,S1 and S2 within normal limits, no S3, no S4, no clicks, no rubs, no murmurs ABD:  Flat, positive bowel sounds normal in frequency in pitch, no bruits, no rebound, no guarding, no midline pulsatile mass, no hepatomegaly, no splenomegaly EXT:  2 plus pulses throughout, trace LE edema, no cyanosis no clubbing SKIN:  No rashes no nodules NEURO:  Cranial nerves II through XII grossly intact, motor grossly intact throughout PSYCH:  Cognitively intact, oriented to person place and time   EKG:  The EKG was personally reviewed and demonstrates:  Atrial fibrillation.  Rate 104 bpm.  Anterolateral T wave inversions Telemetry:  Telemetry was personally reviewed and demonstrates: Atrial fibrillation.  Rate 90s to 110s.  Relevant CV Studies: Echo pending  Laboratory Data:  High Sensitivity Troponin:   Recent Labs  Lab 02/13/20 0735 02/13/20 0959  TROPONINIHS 115* 105*     Chemistry Recent Labs  Lab 02/13/20 0735  NA 134*  K 3.4*  CL 99  CO2 22  GLUCOSE 117*  BUN 19  CREATININE 1.39*  CALCIUM 9.4  GFRNONAA 49*  ANIONGAP 13    Recent Labs  Lab 02/13/20 0735  PROT 7.4  ALBUMIN 4.1  AST 28  ALT 24  ALKPHOS 96  BILITOT 2.3*   Hematology Recent Labs  Lab 02/13/20 0735  WBC 9.2  RBC 4.11*  HGB 13.7  HCT 39.8  MCV 96.8  MCH 33.3  MCHC 34.4  RDW 13.2  PLT 304   BNP Recent Labs  Lab 02/13/20 0735  BNP 1,073.4*    DDimer No results  for input(s): DDIMER in the last 168 hours.   Radiology/Studies:  DG Chest 2 View  Result Date: 02/13/2020 CLINICAL DATA:  Shortness of breath since yesterday. EXAM: CHEST - 2 VIEW COMPARISON:  None. FINDINGS: Lungs are adequately inflated with mild left basilar opacification which may be due to small effusion with atelectasis although infection is possible. Remainder of the lungs are clear. Cardiomediastinal silhouette is normal. Degenerative change of the spine. IMPRESSION: Mild left basilar opacification which may be due to small effusion with atelectasis, although infection is possible. Electronically Signed   By: Elberta Fortis M.D.   On: 02/13/2020 08:56     Assessment and Plan:   # Acute heart failure, type unknown: Echo pending.  He is mildly volume overloaded on exam.  He does not have much lower extremity edema but his JVP is elevated and he notes abdominal distention.  Agree with diuresis.  # Atrial fibrillation:  Rates are mildly elevated.  Will start metoprolol 25 mg twice daily and titrate as needed to keep his heart rate under 100 bpm at rest.  Recommend starting anticoagulation with heparin.  If systolic function is normal and he does not require any further interventions will transition to oral anticoagulation tomorrow.  TSH pending.  He denies any falls, melena or hematochezia.  Hemoglobin stable.  # Hypertension:  Will hold home HCTZ while diuresing.  Amldopine and benazepril have been continued.  # Hypothyroidism: On levothyroxine.  TSH pending.  # Hyperlipidemia:  Continue atorvastatin. Lipid panel pending.  # Elevated troponin:  Likely demand ischemia.  He has no chest pain.  No plan for ischemia evaluation unless there are wall motion abnormalities on echo.  Start heparin for afib as above.         New York Heart Association (NYHA) Functional Class  NYHA Class II   CHA2DS2-VASc Score = 6  This indicates a 9.7% annual risk of stroke. The patient's score is  based upon: CHF History: 0 HTN History: 0 Diabetes History: 1 Stroke History: 2 Vascular Disease History: 1 Age Score: 2 Gender Score: 0         For questions or updates, please contact [facility] Please consult [email or web address] for contact info under    Signed, Chilton Si, MD  02/13/2020 1:14 PM

## 2020-02-13 NOTE — Progress Notes (Signed)
OT Cancellation Note  Patient Details Name: DESMAN POLAK MRN: [id number] DOB: 09/18/1931   Cancelled Treatment:    Reason Eval/Treat Not Completed: Patient at procedure or test/ unavailable. OT order received and chart reviewed. Pt currently off the floor, will continue to follow and initiate services at later date/time as able.   Kathie Dike, M.S. OTR/L  02/13/20, 2:10 PM  ascom [phone]

## 2020-02-13 NOTE — ED Notes (Signed)
Helped pt up to toilet for BM. Pt walked to toilet with steady gait. Pt back in bed now. Wife still at bedside.

## 2020-02-13 NOTE — ED Notes (Signed)
Date and time results received: 02/13/20 0850 (use smartphrase ".now" to insert current time)  Test: Trop Critical Value: 115  Name of Provider Notified: Marisa Severin, MD  Orders Received? Or Actions Taken?: Critical Results Acknowledged

## 2020-02-13 NOTE — Progress Notes (Addendum)
ANTICOAGULATION CONSULT NOTE - Initial Consult  Pharmacy Consult for heparin infusion dosing and monitoring   Indication: atrial fibrillation  No Known Allergies  Patient Measurements: Height: 5\' 7"  (170.2 cm) Weight: 72.6 kg (160 lb) IBW/kg (Calculated) : 66.1 Heparin Dosing Weight: 72.6  Vital Signs: Temp: 97.4 F (36.3 C) (11/21 0723) Temp Source: Oral (11/21 0723) BP: 149/88 (11/21 1230) Pulse Rate: 96 (11/21 1230)  Labs: Recent Labs    02/13/20 0735 02/13/20 0959  HGB 13.7  --   HCT 39.8  --   PLT 304  --   CREATININE 1.39*  --   TROPONINIHS 115* 105*    Estimated Creatinine Clearance: 35.7 mL/min (A) (by C-G formula based on SCr of 1.39 mg/dL (H)).   Medical History: Past Medical History:  Diagnosis Date  . Dementia (HCC)   . Hyperlipidemia   . Hypertension   . Renal insufficiency   . Thyroid disease     Assessment: 86 yo male admitted with acute CHF and new onset A. Fib w/RVR. Patient has past medical history HTN, HLD, and CKD. Pharmacy has been consulted for heparin infusion dosing and monitoring. Patient did received 1 dose on enoxaparin 40mg  @ 1247   Goal of Therapy:  Heparin level 0.3-0.7 units/ml Monitor platelets by anticoagulation protocol: Yes   Plan:  Baseline labs ordered  Will no bolus since patient received enoxaparin 40mg  @ 1247 Start heparin infusion at 1050 units/hr Check anti-Xa level in 8 hours and daily while on heparin Continue to monitor H&H and platelets  88, PharmD, BCPS Clinical Pharmacist 02/13/2020 1:29 PM

## 2020-02-13 NOTE — ED Notes (Signed)
Repeat blood work collected by this Charity fundraiser. Pt tolerated well. Pt continues to rest in bed with wife at bedside. Call bell remains within reach of patient at this time.

## 2020-02-13 NOTE — ED Triage Notes (Signed)
Pt arrived via POV with c/o shortness of breath since this morning after waking up and cough x 1 month.   Denies fevers, denies pain.

## 2020-02-13 NOTE — ED Notes (Signed)
Covid swab obtained by this RN. Pt tolerated well. Pt continues to rest in bed with wife at bedside. Call bell remains with reach. Repeat EKG obtained per EDP order.

## 2020-02-14 ENCOUNTER — Observation Stay
Admit: 2020-02-14 | Discharge: 2020-02-14 | Disposition: A | Discharge status: Home / Self Care | Payer: Medicare Other | Attending: Internal Medicine | Admitting: Internal Medicine

## 2020-02-14 DIAGNOSIS — R778 Other specified abnormalities of plasma proteins: Secondary | ICD-10-CM | POA: Diagnosis not present

## 2020-02-14 DIAGNOSIS — E876 Hypokalemia: Secondary | ICD-10-CM | POA: Diagnosis not present

## 2020-02-14 DIAGNOSIS — I4891 Unspecified atrial fibrillation: Secondary | ICD-10-CM

## 2020-02-14 DIAGNOSIS — I5031 Acute diastolic (congestive) heart failure: Secondary | ICD-10-CM

## 2020-02-14 DIAGNOSIS — E785 Hyperlipidemia, unspecified: Secondary | ICD-10-CM | POA: Diagnosis not present

## 2020-02-14 LAB — LIPID PANEL
Cholesterol: 139 mg/dL (ref 0–200)
HDL: 42 mg/dL (ref >40)
LDL Cholesterol: 85 mg/dL (ref 0–99)
Total CHOL/HDL Ratio: 3.3 RATIO
Triglycerides: 60 mg/dL (ref <150)
VLDL: 12 mg/dL (ref 0–40)

## 2020-02-14 LAB — ECHOCARDIOGRAM COMPLETE
AR max vel: 1.55 cm2
AV Area VTI: 1.66 cm2
AV Area mean vel: 1.68 cm2
AV Mean grad: 3 mmHg
AV Peak grad: 6.4 mmHg
Ao pk vel: 1.26 m/s
Area-P 1/2: 4.44 cm2
Height: 67 in
S' Lateral: 2.5 cm
Weight: 2670.21 oz

## 2020-02-14 LAB — BASIC METABOLIC PANEL
Anion gap: 13 (ref 5–15)
BUN: 21 mg/dL (ref 8–23)
CO2: 25 mmol/L (ref 22–32)
Calcium: 8.9 mg/dL (ref 8.9–10.3)
Chloride: 98 mmol/L (ref 98–111)
Creatinine, Ser: 1.4 mg/dL — ABNORMAL HIGH (ref 0.61–1.24)
GFR, Estimated: 49 mL/min — ABNORMAL LOW (ref >60)
Glucose, Bld: 121 mg/dL — ABNORMAL HIGH (ref 70–99)
Potassium: 3.5 mmol/L (ref 3.5–5.1)
Sodium: 136 mmol/L (ref 135–145)

## 2020-02-14 LAB — TSH: TSH: 2.687 u[IU]/mL (ref 0.350–4.500)

## 2020-02-14 LAB — HEPARIN LEVEL (UNFRACTIONATED)
Heparin Unfractionated: 0.74 IU/mL — ABNORMAL HIGH (ref 0.30–0.70)
Heparin Unfractionated: 0.67 IU/mL (ref 0.30–0.70)

## 2020-02-14 LAB — HEMOGLOBIN A1C
Hgb A1c MFr Bld: 5.7 % — ABNORMAL HIGH (ref 4.8–5.6)
Mean Plasma Glucose: 116.89 mg/dL

## 2020-02-14 MED ORDER — APIXABAN 5 MG PO TABS
5.0000 mg | ORAL_TABLET | Freq: Two times a day (BID) | ORAL | Status: DC
  Administered 2020-02-14 – 2020-02-15 (×2): 5 mg via ORAL
  Filled 2020-02-14 (×2): qty 1

## 2020-02-14 MED ORDER — POTASSIUM CHLORIDE CRYS ER 10 MEQ PO TBCR
10.0000 meq | EXTENDED_RELEASE_TABLET | Freq: Two times a day (BID) | ORAL | Status: DC
  Administered 2020-02-14 – 2020-02-15 (×2): 10 meq via ORAL
  Filled 2020-02-14 (×3): qty 1

## 2020-02-14 MED ORDER — FUROSEMIDE 10 MG/ML IJ SOLN
20.0000 mg | Freq: Two times a day (BID) | INTRAMUSCULAR | Status: DC
  Administered 2020-02-14 – 2020-02-15 (×2): 20 mg via INTRAVENOUS
  Filled 2020-02-14 (×2): qty 2

## 2020-02-14 MED ORDER — SENNOSIDES-DOCUSATE SODIUM 8.6-50 MG PO TABS
1.0000 | ORAL_TABLET | Freq: Two times a day (BID) | ORAL | Status: DC
  Administered 2020-02-14 – 2020-02-15 (×3): 1 via ORAL
  Filled 2020-02-14 (×3): qty 1

## 2020-02-14 MED ORDER — ALBUTEROL SULFATE (2.5 MG/3ML) 0.083% IN NEBU
3.0000 mL | INHALATION_SOLUTION | Freq: Four times a day (QID) | RESPIRATORY_TRACT | Status: DC
  Administered 2020-02-14 – 2020-02-15 (×4): 3 mL via RESPIRATORY_TRACT
  Filled 2020-02-14 (×4): qty 3

## 2020-02-14 NOTE — Progress Notes (Signed)
Progress Note  Patient Name: Robert Frye Date of Encounter: 02/14/2020  Primary Cardiologist: New  Subjective   Breathing improved.  Abd no longer firm/distended.  HRs 60's to 80's.  Inpatient Medications    Scheduled Meds: . amLODipine  5 mg Oral Daily   And  . benazepril  10 mg Oral Daily  . aspirin EC  81 mg Oral Daily  . atorvastatin  10 mg Oral Daily  . cholecalciferol  2,000 Units Oral Daily  . furosemide  20 mg Intravenous Q12H  . levothyroxine  75 mcg Oral QAC breakfast  . metoprolol tartrate  25 mg Oral BID  . senna-docusate  1 tablet Oral BID  . cyanocobalamin  1,000 mcg Oral Q3 days   Continuous Infusions: . heparin 950 Units/hr (02/14/20 0545)   PRN Meds: acetaminophen, albuterol, dextromethorphan-guaiFENesin, hydrALAZINE, morphine injection, nitroGLYCERIN, ondansetron (ZOFRAN) IV   Vital Signs    Vitals:   02/13/20 1712 02/13/20 2022 02/14/20 0412 02/14/20 0803  BP: 119/88 128/78 116/82 (!) 149/79  Pulse: 63 78 84 71  Resp: 18  16 17   Temp: 98 F (36.7 C) 97.7 F (36.5 C) 98.1 F (36.7 C) 97.6 F (36.4 C)  TempSrc:  Oral Oral   SpO2: 95% 93% 91% 92%  Weight:   75.7 kg   Height:        Intake/Output Summary (Last 24 hours) at 02/14/2020 1037 Last data filed at 02/14/2020 0413 Gross per 24 hour  Intake 202.69 ml  Output 825 ml  Net -622.31 ml   Filed Weights   02/13/20 0725 02/13/20 1427 02/14/20 0412  Weight: 72.6 kg 75.2 kg 75.7 kg    Physical Exam   GEN: Well nourished, well developed, in no acute distress.  HEENT: Grossly normal.  Neck: Supple, no JVD, carotid bruits, or masses. Cardiac: IR, IR, no murmurs, rubs, or gallops. No clubbing, cyanosis, edema.  Radials 2+, DP/PT 1+ and equal bilaterally.  Respiratory:  Respirations regular and unlabored, bibasilar crackles. GI: Soft, nontender, nondistended, BS + x 4. MS: no deformity or atrophy. Skin: warm and dry, no rash. Neuro:  Strength and sensation are intact. Psych:  AAOx3. Normal affect.  Labs    Chemistry Recent Labs  Lab 02/13/20 0735 02/14/20 0410  NA 134* 136  K 3.4* 3.5  CL 99 98  CO2 22 25  GLUCOSE 117* 121*  BUN 19 21  CREATININE 1.39* 1.40*  CALCIUM 9.4 8.9  PROT 7.4  --   ALBUMIN 4.1  --   AST 28  --   ALT 24  --   ALKPHOS 96  --   BILITOT 2.3*  --   GFRNONAA 49* 49*  ANIONGAP 13 13     Hematology Recent Labs  Lab 02/13/20 0735  WBC 9.2  RBC 4.11*  HGB 13.7  HCT 39.8  MCV 96.8  MCH 33.3  MCHC 34.4  RDW 13.2  PLT 304    Cardiac Enzymes  Recent Labs  Lab 02/13/20 0735 02/13/20 0959 02/13/20 1339 02/13/20 1640  TROPONINIHS 115* 105* 114* 125*      BNP Recent Labs  Lab 02/13/20 0735  BNP 1,073.4*     Lipids  Lab Results  Component Value Date   CHOL 139 02/14/2020   HDL 42 02/14/2020   LDLCALC 85 02/14/2020   TRIG 60 02/14/2020   CHOLHDL 3.3 02/14/2020    HbA1c  Lab Results  Component Value Date   HGBA1C 5.7 (H) 02/14/2020    Radiology  DG Chest 2 View  Result Date: 02/13/2020 CLINICAL DATA:  Shortness of breath since yesterday. EXAM: CHEST - 2 VIEW COMPARISON:  None. FINDINGS: Lungs are adequately inflated with mild left basilar opacification which may be due to small effusion with atelectasis although infection is possible. Remainder of the lungs are clear. Cardiomediastinal silhouette is normal. Degenerative change of the spine. IMPRESSION: Mild left basilar opacification which may be due to small effusion with atelectasis, although infection is possible. Electronically Signed   By: Elberta Fortis M.D.   On: 02/13/2020 08:56    Telemetry    Afib, 80's - Personally Reviewed  Cardiac Studies   2D Echocardiogram pending  Patient Profile     86 y.o. male with a hx of hypertension, hyperlipidemia, CKD 3, and mild dementia, who was admitted 11/21 w/ progressive dyspnea, CHF, and newly dx Afib w/ RVR.  Assessment & Plan    1.  Acute Heart Failure:  ? syst vs diastolic.  Echo  pending.  Breathing improved and he and his wife note significant improvement in abd distension.  No JVD or LEE on exam this AM.  Minus 622 thus far.  Wt up 0.5kg.  Renal fxn relatively stable.  Cont IV lasix for now - await filling pressure estimates on echo.  Unclear at this point if afib driving CHF or vice-versa.  2.  Persistent Afib:  Well-rate-controlled on  blocker therapy.  Asymp @ rest this AM, though certainly could have contributed to development of CHF prior to admission. Currently on heparin pending echo read.  CHA2DS2VASc at least 4.  Will plan transition to eliquis once it's clear that no invasive interventions required.  3.  Demand Ischemia:  No h/o cp, but progressive dyspnea prior to admission.  HsTrop mildly elev (peak 125 thus far) w/ flat trend.  No c/p.  Echo pending.  If nl EF w/o wma, would not pursue ischemic eval @ this time.  Cont  blocker and statin.  Currently on ASA but would plan to d/c once on eliquis.  4. Essential HTN:  Higher this AM but stable throughout the day yesterday.  Follow on current meds.  5.  CKD III:  Relatively stable.  6.  HL:  Cont statin rx.  LDL 85.  Signed, Nicolasa Ducking, NP  02/14/2020, 10:37 AM    For questions or updates, please contact   Please consult [email or web address] for contact info under Cardiology/STEMI.

## 2020-02-14 NOTE — Evaluation (Signed)
Occupational Therapy Evaluation Patient Details Name: Robert Frye MRN: [id number] DOB: Aug 13, 1931 Today's Date: 02/14/2020    History of Present Illness Pt is an 86 y.o. male presenting to hospital 11/21 with SOB; pt with non-productive cough x1 month.  Pt admitted with acute CHF, new onset a-fib with RVR, elevated troponin likely d/t demand ischemia (secondary CHF and a-fib), and hypokalemia.  PMH includes dementia and htn.   Clinical Impression   Pt was seen for OT evaluation this date. Per pt report and chart review, prior to hospital admission, pt was independent with mobility, basic ADL, and mowing the yard occasionally. Pt reports wife assists with medication mgt, meal prep, housekeeping tasks, and transportation. Pt lives with his wife in a 1 story home. Currently pt alert and oriented x3, requiring PRN cues for safety. SBA for STS ADL tasks for safety. Pt educated in chronic disease mgt strategies; pt endorses no recall of previously learned strategies (staff with pt about an hour before OT was there), pt would benefit from caregiver present during future sessions to support recall and carryover of learned strategies. Pt demonstrates impairments as described below (See OT problem list) which functionally limit his ability to perform ADL/self-care tasks. Pt would benefit from skilled OT services while hospitalized to address noted impairments and functional limitations (see below for any additional details) in order to maximize safety and independence while minimizing falls risk and caregiver burden. Upon hospital discharge, do not anticipate additional skilled OT Services at this time, however pt may benefit down the road if his cognitive impairments progress leading to increased caregiver burden and falls risk.      Follow Up Recommendations  No OT follow up;Supervision/Assistance - 24 hour    Equipment Recommendations  None recommended by OT    Recommendations for Other Services        Precautions / Restrictions Precautions Precautions: Fall Restrictions Weight Bearing Restrictions: No      Mobility Bed Mobility Overal bed mobility: Independent             General bed mobility comments: Supine to/from sit without any noted difficulties    Transfers Overall transfer level: Independent Equipment used: None             General transfer comment: steady safe transfers noted    Balance Overall balance assessment: Modified Independent Sitting-balance support: No upper extremity supported;Feet supported Sitting balance-Leahy Scale: Normal Sitting balance - Comments: steady sitting reaching outside BOS   Standing balance support: No upper extremity supported;During functional activity Standing balance-Leahy Scale: Good Standing balance comment: no loss of balance noted with ambulation                           ADL either performed or assessed with clinical judgement   ADL Overall ADL's : Needs assistance/impaired                                       General ADL Comments: Pt currently requires SBA for bathing, LB dressing, and remote supervision for toileting for safety     Vision Baseline Vision/History: Wears glasses Wears Glasses: Reading only Patient Visual Report: No change from baseline       Perception     Praxis      Pertinent Vitals/Pain Pain Assessment: No/denies pain     Hand Dominance Right   Extremity/Trunk Assessment Upper  Extremity Assessment Upper Extremity Assessment: Overall WFL for tasks assessed   Lower Extremity Assessment Lower Extremity Assessment: Overall WFL for tasks assessed   Cervical / Trunk Assessment Cervical / Trunk Assessment: Normal   Communication Communication Communication: HOH (hearing aide R ear)   Cognition Arousal/Alertness: Awake/alert Behavior During Therapy: WFL for tasks assessed/performed Overall Cognitive Status: History of cognitive impairments  - at baseline                                 General Comments: oriented to self, date, time, and demo'd difficulty with place and why he was here; follows commands with cues,also HOH   General Comments       Exercises Other Exercises Other Exercises: Pt educated in chronic disease mgt strategies; pt endorses no recall of previously learned strategies (staff with pt about an hour before OT was there), pt would benefit from caregiver present during future sessions to support recall and carryover of learned strategies   Shoulder Instructions      Home Living Family/patient expects to be discharged to:: Private residence Living Arrangements: Spouse/significant other Available Help at Discharge: Family Type of Home: House Home Access: Stairs to enter (pt reports no steps to enter home) Entrance Stairs-Number of Steps: 3 steps and no rail from garage into home, reports rail on front of house   Home Layout: One level     Bathroom Shower/Tub: Walk-in shower;Tub/shower unit (has both, uses walk in)   [employer]: Standard     Home Equipment: None;Shower seat;Walker - 4 wheels   Additional Comments: doesn't use shower seat      Prior Functioning/Environment Level of Independence: Independent;Needs assistance  Gait / Transfers Assistance Needed: Pt reports walking around his big yard to get exercise. Does not ambulate with DME. Reports can still mow his lawn. ADL's / Homemaking Assistance Needed: Pt indep with basic ADL, wife provides transportation, meal prep, housekeeping, and med mgt   Comments: pt states "not really" when asked about falls within past 12 mo        OT Problem List: Decreased safety awareness;Decreased cognition      OT Treatment/Interventions: Self-care/ADL training;Therapeutic activities;Cognitive remediation/compensation;DME and/or AE instruction;Patient/family education    OT Goals(Current goals can be found in the care plan section)  Acute Rehab OT Goals Patient Stated Goal: to go home OT Goal Formulation: With patient Time For Goal Achievement: 02/28/20 Potential to Achieve Goals: Good ADL Goals Additional ADL Goal #1: Pt/spouse will verbalize plan to implement at least 1 learned falls prevention strategy. Additional ADL Goal #2: Pt/spouse will verbalize plan to implement at least 1 learned chronic disease mgt strategy to maximize safety. Additional ADL Goal #3: Pt will perform seated sponge bath/dressing with set up and supervision for safety with VSS throughout and no LOB.  OT Frequency: Min 1X/week   Barriers to D/C:            Co-evaluation              AM-PAC OT "6 Clicks" Daily Activity     Outcome Measure Help from another person eating meals?: None Help from another person taking care of personal grooming?: None Help from another person toileting, which includes using toliet, bedpan, or urinal?: A Little Help from another person bathing (including washing, rinsing, drying)?: A Little Help from another person to put on and taking off regular upper body clothing?: None Help from another person to put  on and taking off regular lower body clothing?: A Little 6 Click Score: 21   End of Session    Activity Tolerance: Patient tolerated treatment well Patient left: in bed;with call bell/phone within reach;with bed alarm set;with nursing/sitter in room  OT Visit Diagnosis: Other abnormalities of gait and mobility (R26.89)                Time: 7425-9563 OT Time Calculation (min): 17 min Charges:  OT General Charges $OT Visit: 1 Visit OT Evaluation $OT Eval Low Complexity: 1 Low OT Treatments $Self Care/Home Management : 8-22 mins  Richrd Prime, MPH, MS, OTR/L ascom [phone] 02/14/20, 4:38 PM

## 2020-02-14 NOTE — Progress Notes (Signed)
*  PRELIMINARY RESULTS* Echocardiogram 2D Echocardiogram has been performed.  Joanette Gula Dustie Brittle 02/14/2020, 9:11 AM

## 2020-02-14 NOTE — Evaluation (Signed)
Physical Therapy Evaluation Patient Details Name: Robert Frye MRN: [id number] DOB: Nov 11, 1931 Today's Date: 02/14/2020   History of Present Illness  Pt is an 86 y.o. male presenting to hospital 11/21 with SOB; pt with non-productive cough x1 month.  Pt admitted with acute CHF, new onset a-fib with RVR, elevated troponin likely d/t demand ischemia (secondary CHF and a-fib), and hypokalemia.  PMH includes dementia and htn.  Clinical Impression  Prior to hospital admission, pt reports being independent with ambulation.  Some confusion noted during session (pt with PMH of dementia; also noted to be HOH--hearing aide R ear); lives with his wife (pt reports she recently went home to do some things).  Currently pt is independent with bed mobility and transfers; and SBA ambulating 240 feet (no AD).  Pt noted with increased SOB end of ambulation requiring a couple minutes of sitting rest break to resolve SOB (pt reports that is typical for him).  O2 sats 93% or greater on room air; heart rate 72-98 bpm at rest in a-fib rhythm (HR increased up to 110 bpm with ambulation).  Pt appears steady with ambulation.  No acute PT needs identified; PT will sign off.  Please re-consult PT if pt's status changes and acute PT needs are identified.    Follow Up Recommendations No PT follow up    Equipment Recommendations  None recommended by PT    Recommendations for Other Services       Precautions / Restrictions Precautions Precautions: Fall Restrictions Weight Bearing Restrictions: No      Mobility  Bed Mobility Overal bed mobility: Independent             General bed mobility comments: Supine to/from sit without any noted difficulties    Transfers Overall transfer level: Independent Equipment used: None             General transfer comment: steady safe transfers noted  Ambulation/Gait Ambulation/Gait assistance: Supervision (SBA for IV pole management) Gait Distance (Feet): 240  Feet Assistive device: None Gait Pattern/deviations: WFL(Within Functional Limits) Gait velocity: mildly decreased   General Gait Details: steady ambulation noted  Stairs            Wheelchair Mobility    Modified Rankin (Stroke Patients Only)       Balance Overall balance assessment: Modified Independent Sitting-balance support: No upper extremity supported;Feet supported Sitting balance-Leahy Scale: Normal Sitting balance - Comments: steady sitting reaching outside BOS   Standing balance support: No upper extremity supported;During functional activity Standing balance-Leahy Scale: Good Standing balance comment: no loss of balance noted with ambulation                             Pertinent Vitals/Pain Pain Assessment: No/denies pain    Home Living Family/patient expects to be discharged to:: Private residence Living Arrangements: Spouse/significant other Available Help at Discharge: Family Type of Home: House Home Access:  (pt reports no steps to enter home)     Home Layout: One level Home Equipment: None      Prior Function Level of Independence: Independent         Comments: Pt reports walking around his big yard to get exercise.     Hand Dominance        Extremity/Trunk Assessment   Upper Extremity Assessment Upper Extremity Assessment: Overall WFL for tasks assessed    Lower Extremity Assessment Lower Extremity Assessment: Overall WFL for tasks assessed    Cervical /  Trunk Assessment Cervical / Trunk Assessment: Normal  Communication   Communication: HOH (hearing aide R ear)  Cognition                                              General Comments   Nursing cleared pt for participation in physical therapy.  Pt agreeable to PT session.    Exercises     Assessment/Plan    PT Assessment Patent does not need any further PT services  PT Problem List         PT Treatment Interventions      PT  Goals (Current goals can be found in the Care Plan section)  Acute Rehab PT Goals Patient Stated Goal: to go home PT Goal Formulation: With patient Time For Goal Achievement: 02/28/20 Potential to Achieve Goals: Good    Frequency     Barriers to discharge        Co-evaluation               AM-PAC PT "6 Clicks" Mobility  Outcome Measure Help needed turning from your back to your side while in a flat bed without using bedrails?: None Help needed moving from lying on your back to sitting on the side of a flat bed without using bedrails?: None Help needed moving to and from a bed to a chair (including a wheelchair)?: None Help needed standing up from a chair using your arms (e.g., wheelchair or bedside chair)?: None Help needed to walk in hospital room?: None Help needed climbing 3-5 steps with a railing? : A Little 6 Click Score: 23    End of Session Equipment Utilized During Treatment: Gait belt Activity Tolerance: Patient tolerated treatment well Patient left: in bed;with call bell/phone within reach;with bed alarm set;Other (comment) (lab present) Nurse Communication: Mobility status;Precautions PT Visit Diagnosis: Muscle weakness (generalized) (M62.81)    Time: 1351-1410 PT Time Calculation (min) (ACUTE ONLY): 19 min   Charges:   PT Evaluation $PT Eval Low Complexity: 1 Low         Mira Balon, PT 02/14/20, 2:30 PM

## 2020-02-14 NOTE — Progress Notes (Signed)
Mobility Specialist - Progress Note   02/14/20 1553  Mobility  Activity Ambulated in room  Level of Assistance Standby assist, set-up cues, supervision of patient - no hands on  Assistive Device None  Distance Ambulated (ft) 25 ft  Mobility Response Tolerated well  Mobility performed by Mobility specialist  $Mobility charge 1 Mobility    Pre-mobility: 84 HR, 91% SpO2 Post-mobility: 87 HR, 93% SpO2   Pt was lying in bed upon arrival utilizing room air. Pt agreed to session. Pt denied any pain, nausea, or fatigue. Pt was able to perform all transfers this date with close supervision. Pt denied weakness or SOB during activity. Overall, pt tolerated session well. Pt was left in bed with all needs in reach and bed alarm set.    Robert Frye Mobility Specialist 02/14/20, 3:57 PM

## 2020-02-14 NOTE — Plan of Care (Signed)
  Problem: Safety: Goal: Ability to remain free from injury will improve Outcome: Not Progressing   

## 2020-02-14 NOTE — Progress Notes (Signed)
ANTICOAGULATION CONSULT NOTE - Initial Consult  Pharmacy Consult for heparin infusion dosing and monitoring   Indication: atrial fibrillation  No Known Allergies  Patient Measurements: Height: 5\' 7"  (170.2 cm) Weight: 75.7 kg (166 lb 14.2 oz) IBW/kg (Calculated) : 66.1 Heparin Dosing Weight: 72.6  Vital Signs: Temp: 98.1 F (36.7 C) (11/22 0412) Temp Source: Oral (11/22 0412) BP: 116/82 (11/22 0412) Pulse Rate: 84 (11/22 0412)  Labs: Recent Labs    02/13/20 0735 02/13/20 0735 02/13/20 0959 02/13/20 1339 02/13/20 1640 02/13/20 2210 02/14/20 0410  HGB 13.7  --   --   --   --   --   --   HCT 39.8  --   --   --   --   --   --   PLT 304  --   --   --   --   --   --   APTT  --   --   --  33  --   --   --   LABPROT  --   --   --  14.2  --   --   --   INR  --   --   --  1.1  --   --   --   HEPARINUNFRC  --   --   --   --   --  0.81* 0.74*  CREATININE 1.39*  --   --   --   --   --  1.40*  TROPONINIHS 115*   < > 105* 114* 125*  --   --    < > = values in this interval not displayed.    Estimated Creatinine Clearance: 35.4 mL/min (A) (by C-G formula based on SCr of 1.4 mg/dL (H)).   Medical History: Past Medical History:  Diagnosis Date  . Dementia (HCC)   . Hyperlipidemia   . Hypertension   . Renal insufficiency   . Thyroid disease     Assessment: 86 yo male admitted with acute CHF and new onset A. Fib w/RVR. Patient has past medical history HTN, HLD, and CKD. Pharmacy has been consulted for heparin infusion dosing and monitoring. Patient did received 1 dose on enoxaparin 40mg  @ 1247   Goal of Therapy:  Heparin level 0.3-0.7 units/ml Monitor platelets by anticoagulation protocol: Yes   Plan:  11/21:  HL @ 2210 = 0.81 HL is slightly elevated but most likely due to lovenox 40 mg given on 11/21 @ 1247 and decreased renal function.   Will continue pt on current rate and recheck HL in 6 hrs on 11/22 @ 0400.   11/22:  HL @ 0410 = 0.74 HL remains elevated ~ 14 hrs  after rate change. Will decrease heparin drip to 950 units/hr and recheck HL 8 hrs after rate change.   12/22, PharmD Clinical Pharmacist 02/14/2020 5:31 AM

## 2020-02-14 NOTE — Progress Notes (Signed)
ANTICOAGULATION CONSULT NOTE   Pharmacy Consult for heparin infusion dosing and monitoring and transition to Apixaban  Indication: atrial fibrillation  No Known Allergies  Patient Measurements: Height: 5\' 7"  (170.2 cm) Weight: 75.7 kg (166 lb 14.2 oz) IBW/kg (Calculated) : 66.1 Heparin Dosing Weight: 72.6  Vital Signs: Temp: 97.4 F (36.3 C) (11/22 1152) Temp Source: Oral (11/22 1152) BP: 121/75 (11/22 1152) Pulse Rate: 59 (11/22 1152)  Labs: Recent Labs    02/13/20 0735 02/13/20 0735 02/13/20 0959 02/13/20 1339 02/13/20 1640 02/13/20 2210 02/14/20 0410 02/14/20 1408  HGB 13.7  --   --   --   --   --   --   --   HCT 39.8  --   --   --   --   --   --   --   PLT 304  --   --   --   --   --   --   --   APTT  --   --   --  33  --   --   --   --   LABPROT  --   --   --  14.2  --   --   --   --   INR  --   --   --  1.1  --   --   --   --   HEPARINUNFRC  --   --   --   --   --  0.81* 0.74* 0.67  CREATININE 1.39*  --   --   --   --   --  1.40*  --   TROPONINIHS 115*   < > 105* 114* 125*  --   --   --    < > = values in this interval not displayed.    Estimated Creatinine Clearance: 35.4 mL/min (A) (by C-G formula based on SCr of 1.4 mg/dL (H)).   Medical History: Past Medical History:  Diagnosis Date  . Dementia (HCC)   . Hyperlipidemia   . Hypertension   . Renal insufficiency   . Thyroid disease     Assessment: 86 yo male admitted with acute CHF and new onset A. Fib w/RVR. Patient has past medical history HTN, HLD, and CKD. Pharmacy has been consulted for heparin infusion dosing and monitoring. Patient did received 1 dose on enoxaparin 40mg  @ 1247   11/21 2210 HL 0.81, no change 11/22 0410 HL 0.74, decreased to 950 units/hr 11/22 1408 HL 0.67, therapeutic x 1  Pharmacy consulted to transition patient to Apixaban.  Goal of Therapy:  Heparin level 0.3-0.7 units/ml Monitor platelets by anticoagulation protocol: Yes   Plan:   HL 0.67 is therapeutic, will  continue with current rate of 950 units/hr.  Discontinue Heparin infusion at 21:00 11/22  Apixaban 5mg  PO BID to begin 11/22 at 22:00  Follow up on AM CBC  12/22, PharmD, BCPS 02/14/2020 3:16 PM

## 2020-02-14 NOTE — Progress Notes (Addendum)
   Heart Failure Nurse Navigator Note.  HFpEF 55-60%.  Grade I diastolic dysfunction.Right ventricular function is normal. Right ventricle is mildly dilated. Severe LAE. Moderate RAE.  Mild MR.  He presented with one month of SOB and cough.  Co morbidities:  Hypertension Hyperlipidemia CKD stage III New onset atrial fibrillation  Medications;  Amlodipine 5 mg daily  Benazepril 10 mg daily ASA 81 mg daily Lipitor 10 mg daily Metoprolol tartrate 25 mg BID Lasix on hold  Labs:  Sodium 136, potassium 3.5, BUN 21, creatinine 1.4, BNP 1073, total cholesterol 139, triglycerides 60, Hdl 42, Ldl 85.  Reds clip vest reading 32 BP 149/79 pulse 71 BMI 26.1 CXR- atelectasis vs infection or small pleural effusion. Weight 75.7    Assessment:  General- is awake and alert, no acute distress noted.  HEENT- nromocephalic, pupils equal, non icteric, no JVD  Cardiac- heart tones are irregular, no murmur  Chest- clear to posterior auscultation.  Abdomen- rounded soft nontender  Musculoskeletal- no lower extremity edema.  Psych- is pleasant and appropriate. Makes eye contact.  neuro- speech is clear     Wife was not present but discussed with patient taking care of himself and heart failure after discharge.  He states he does not weigh himself daily, encourage to start to weigh daily and record.  Discussed what to report to doctor.  Also discussed diet, staying away from salted crackers and using salt at the table.  He states they rarely eat at restaurant.  Discussed  when eating at a restaurant like  [employer], ask the waiter if they would not add the seasoning.  Given heart failure booklet and zone magnet.   Tresa Endo RN, CHFN

## 2020-02-14 NOTE — Progress Notes (Signed)
Patient ID: Robert Frye, male   DOB: 06/10/1931, 86 y.o.   MRN: [id number] [facility] PROGRESS NOTE  Robert Frye:[id number] DOB: 12/29/1931 DOA: 02/13/2020 PCP: Gracelyn Nurse, MD  HPI/Subjective: Patient came in with shortness of breath.  This has been going on for a while at least few weeks.  Does have some cough.  Was found to be in atrial fibrillation.  Objective: Vitals:   02/14/20 1152 02/14/20 1600  BP: 121/75 (!) 143/93  Pulse: (!) 59 67  Resp: 18 (!) 22  Temp: (!) 97.4 F (36.3 C) 97.7 F (36.5 C)  SpO2: 92% 96%    Intake/Output Summary (Last 24 hours) at 02/14/2020 1621 Last data filed at 02/14/2020 0413 Gross per 24 hour  Intake 187.69 ml  Output 650 ml  Net -462.31 ml   Filed Weights   02/13/20 0725 02/13/20 1427 02/14/20 0412  Weight: 72.6 kg 75.2 kg 75.7 kg    ROS: Review of Systems  Respiratory: Positive for cough, shortness of breath and wheezing.   Cardiovascular: Negative for chest pain.  Gastrointestinal: Negative for abdominal pain, nausea and vomiting.   Exam: Physical Exam HENT:     Head: Normocephalic.     Mouth/Throat:     Pharynx: No oropharyngeal exudate.  Eyes:     General: Lids are normal.     Conjunctiva/sclera: Conjunctivae normal.     Pupils: Pupils are equal, round, and reactive to light.  Cardiovascular:     Rate and Rhythm: Normal rate. Rhythm irregularly irregular.     Heart sounds: Normal heart sounds, S1 normal and S2 normal.  Pulmonary:     Breath sounds: Examination of the right-middle field reveals decreased breath sounds and wheezing. Examination of the left-middle field reveals decreased breath sounds and wheezing. Examination of the right-lower field reveals decreased breath sounds and wheezing. Examination of the left-lower field reveals decreased breath sounds and wheezing. Decreased breath sounds and wheezing present. No rhonchi or rales.  Abdominal:     General: Abdomen is flat.     Palpations:  Abdomen is soft.     Tenderness: There is no abdominal tenderness.  Musculoskeletal:     Right lower leg: No swelling.     Left lower leg: No swelling.  Skin:    General: Skin is warm.     Findings: No rash.  Neurological:     Mental Status: He is alert and oriented to person, place, and time.       Data Reviewed: Basic Metabolic Panel: Recent Labs  Lab 02/13/20 0735 02/13/20 0959 02/14/20 0410  NA 134*  --  136  K 3.4*  --  3.5  CL 99  --  98  CO2 22  --  25  GLUCOSE 117*  --  121*  BUN 19  --  21  CREATININE 1.39*  --  1.40*  CALCIUM 9.4  --  8.9  MG  --  2.1  --    Liver Function Tests: Recent Labs  Lab 02/13/20 0735  AST 28  ALT 24  ALKPHOS 96  BILITOT 2.3*  PROT 7.4  ALBUMIN 4.1   CBC: Recent Labs  Lab 02/13/20 0735  WBC 9.2  NEUTROABS 6.8  HGB 13.7  HCT 39.8  MCV 96.8  PLT 304   BNP (last 3 results) Recent Labs    02/13/20 0735  BNP 1,073.4*      Recent Results (from the past 240 hour(s))  Resp Panel by RT-PCR (Flu A&B, Covid)  Nasopharyngeal Swab     Status: None   Collection Time: 02/13/20  8:35 AM   Specimen: Nasopharyngeal Swab; Nasopharyngeal(NP) swabs in vial transport medium  Result Value Ref Range Status   SARS Coronavirus 2 by RT PCR NEGATIVE NEGATIVE Final    Comment: (NOTE) SARS-CoV-2 target nucleic acids are NOT DETECTED.  The SARS-CoV-2 RNA is generally detectable in upper respiratory specimens during the acute phase of infection. The lowest concentration of SARS-CoV-2 viral copies this assay can detect is 138 copies/mL. A negative result does not preclude SARS-Cov-2 infection and should not be used as the sole basis for treatment or other patient management decisions. A negative result may occur with  improper specimen collection/handling, submission of specimen other than nasopharyngeal swab, presence of viral mutation(s) within the areas targeted by this assay, and inadequate number of viral copies(<138 copies/mL).  A negative result must be combined with clinical observations, patient history, and epidemiological information. The expected result is Negative.  Fact Sheet for Patients:  BloggerCourse.com  Fact Sheet for Healthcare Providers:  SeriousBroker.it  This test is no t yet approved or cleared by the [place] FDA and  has been authorized for detection and/or diagnosis of SARS-CoV-2 by FDA under an Emergency Use Authorization (EUA). This EUA will remain  in effect (meaning this test can be used) for the duration of the COVID-19 declaration under Section 564(b)(1) of the Act, 21 U.S.C.section 360bbb-3(b)(1), unless the authorization is terminated  or revoked sooner.       Influenza A by PCR NEGATIVE NEGATIVE Final   Influenza B by PCR NEGATIVE NEGATIVE Final    Comment: (NOTE) The Xpert Xpress SARS-CoV-2/FLU/RSV plus assay is intended as an aid in the diagnosis of influenza from Nasopharyngeal swab specimens and should not be used as a sole basis for treatment. Nasal washings and aspirates are unacceptable for Xpert Xpress SARS-CoV-2/FLU/RSV testing.  Fact Sheet for Patients: BloggerCourse.com  Fact Sheet for Healthcare Providers: SeriousBroker.it  This test is not yet approved or cleared by the [place] FDA and has been authorized for detection and/or diagnosis of SARS-CoV-2 by FDA under an Emergency Use Authorization (EUA). This EUA will remain in effect (meaning this test can be used) for the duration of the COVID-19 declaration under Section 564(b)(1) of the Act, 21 U.S.C. section 360bbb-3(b)(1), unless the authorization is terminated or revoked.  Performed at [facility], [address], [place], [place] [address]      Studies: DG Chest 2 View  Result Date: 02/13/2020 CLINICAL DATA:  Shortness of breath since yesterday. EXAM: CHEST - 2 VIEW  COMPARISON:  None. FINDINGS: Lungs are adequately inflated with mild left basilar opacification which may be due to small effusion with atelectasis although infection is possible. Remainder of the lungs are clear. Cardiomediastinal silhouette is normal. Degenerative change of the spine. IMPRESSION: Mild left basilar opacification which may be due to small effusion with atelectasis, although infection is possible. Electronically Signed   By: Elberta Fortis M.D.   On: 02/13/2020 08:56   ECHOCARDIOGRAM COMPLETE  Result Date: 02/14/2020    ECHOCARDIOGRAM REPORT   Patient Name:   Robert Frye Date of Exam: 02/14/2020 Medical Rec #:  [id number]       Height:       67.0 in Accession #:    [id number]      Weight:       166.9 lb Date of Birth:  1931/12/29        BSA:  1.873 m Patient Age:    88 years        BP:           149/79 mmHg Patient Gender: M               HR:           91 bpm. Exam Location:  [facility] Procedure: 2D Echo, Color Doppler and Cardiac Doppler Indications:     I48.91 Atrial Fibrillation  History:         Patient has no prior history of Echocardiogram examinations.                  Signs/Symptoms:Dementia; Risk Factors:Hypertension and                  Dyslipidemia.  Sonographer:     Humphrey RollsJoan Heiss RDCS (AE) Referring Phys:  Kern [id number] Brien FewXILIN NIU Diagnosing Phys: Adrian BlackwaterShaukat Khan MD  Sonographer Comments: Suboptimal subcostal window. IMPRESSIONS  1. Left ventricular ejection fraction, by estimation, is 55 to 60%. The left ventricle has normal function. The left ventricle has no regional wall motion abnormalities. There is severe eccentric left ventricular hypertrophy of the apical and basal-septal segments. Left ventricular diastolic parameters are consistent with Grade I diastolic dysfunction (impaired relaxation).  2. Right ventricular systolic function is normal. The right ventricular size is mildly enlarged.  3. Left atrial size was severely dilated.  4. Right atrial size was moderately dilated.  5. The  mitral valve is normal in structure. Mild mitral valve regurgitation. No evidence of mitral stenosis.  6. The aortic valve is normal in structure. Aortic valve regurgitation is not visualized. No aortic stenosis is present.  7. The inferior vena cava is normal in size with greater than 50% respiratory variability, suggesting right atrial pressure of 3 mmHg. Conclusion(s)/Recommendation(s): Findings consistent with hypertrophic obstructive cardiomyopathy. FINDINGS  Left Ventricle: Left ventricular ejection fraction, by estimation, is 55 to 60%. The left ventricle has normal function. The left ventricle has no regional wall motion abnormalities. The left ventricular internal cavity size was normal in size. There is  severe eccentric left ventricular hypertrophy of the apical and basal-septal segments. Left ventricular diastolic parameters are consistent with Grade I diastolic dysfunction (impaired relaxation). Right Ventricle: The right ventricular size is mildly enlarged. No increase in right ventricular wall thickness. Right ventricular systolic function is normal. Left Atrium: Left atrial size was severely dilated. Right Atrium: Right atrial size was moderately dilated. Pericardium: There is no evidence of pericardial effusion. Mitral Valve: The mitral valve is normal in structure. Mild mitral valve regurgitation. No evidence of mitral valve stenosis. MV peak gradient, 4.7 mmHg. The mean mitral valve gradient is 2.0 mmHg. Tricuspid Valve: The tricuspid valve is normal in structure. Tricuspid valve regurgitation is mild . No evidence of tricuspid stenosis. Aortic Valve: The aortic valve is normal in structure. Aortic valve regurgitation is not visualized. No aortic stenosis is present. Aortic valve mean gradient measures 3.0 mmHg. Aortic valve peak gradient measures 6.4 mmHg. Aortic valve area, by VTI measures 1.66 cm. Pulmonic Valve: The pulmonic valve was normal in structure. Pulmonic valve regurgitation is  trivial. No evidence of pulmonic stenosis. Aorta: The aortic root is normal in size and structure. Venous: The inferior vena cava is normal in size with greater than 50% respiratory variability, suggesting right atrial pressure of 3 mmHg. IAS/Shunts: No atrial level shunt detected by color flow Doppler.  LEFT VENTRICLE PLAX 2D LVIDd:         3.66  cm  Diastology LVIDs:         2.50 cm  LV e' lateral:   4.79 cm/s LV PW:         1.48 cm  LV E/e' lateral: 21.7 LV IVS:        0.96 cm LVOT diam:     1.70 cm LV SV:         34 LV SV Index:   18 LVOT Area:     2.27 cm  RIGHT VENTRICLE RV Basal diam:  2.93 cm LEFT ATRIUM             Index       RIGHT ATRIUM           Index LA diam:        3.60 cm 1.92 cm/m  RA Area:     12.60 cm LA Vol (A2C):   27.7 ml 14.79 ml/m RA Volume:   25.80 ml  13.78 ml/m LA Vol (A4C):   58.3 ml 31.13 ml/m LA Biplane Vol: 43.8 ml 23.39 ml/m  AORTIC VALVE                   PULMONIC VALVE AV Area (Vmax):    1.55 cm    PV Vmax:       1.06 m/s AV Area (Vmean):   1.68 cm    PV Vmean:      71.500 cm/s AV Area (VTI):     1.66 cm    PV VTI:        0.174 m AV Vmax:           126.00 cm/s PV Peak grad:  4.5 mmHg AV Vmean:          84.000 cm/s PV Mean grad:  2.0 mmHg AV VTI:            0.204 m AV Peak Grad:      6.4 mmHg AV Mean Grad:      3.0 mmHg LVOT Vmax:         86.20 cm/s LVOT Vmean:        62.000 cm/s LVOT VTI:          0.149 m LVOT/AV VTI ratio: 0.73  AORTA Ao Root diam: 3.10 cm MITRAL VALVE MV Area (PHT): 4.44 cm     SHUNTS MV Peak grad:  4.7 mmHg     Systemic VTI:  0.15 m MV Mean grad:  2.0 mmHg     Systemic Diam: 1.70 cm MV Vmax:       1.08 m/s MV Vmean:      59.1 cm/s MV Decel Time: 171 msec MV E velocity: 104.00 cm/s Adrian Blackwater MD Electronically signed by Adrian Blackwater MD Signature Date/Time: 02/14/2020/11:49:08 AM    Final     Scheduled Meds: . albuterol  3 mL Inhalation QID  . amLODipine  5 mg Oral Daily   And  . benazepril  10 mg Oral Daily  . apixaban  5 mg Oral BID  .  aspirin EC  81 mg Oral Daily  . atorvastatin  10 mg Oral Daily  . cholecalciferol  2,000 Units Oral Daily  . furosemide  20 mg Intravenous BID  . levothyroxine  75 mcg Oral QAC breakfast  . metoprolol tartrate  25 mg Oral BID  . senna-docusate  1 tablet Oral BID  . cyanocobalamin  1,000 mcg Oral Q3 days   Continuous Infusions: . heparin 950 Units/hr (02/14/20 1219)    Assessment/Plan:  1. Acute on chronic diastolic congestive heart failure.  Continue Lasix IV.  Patient on metoprolol and benazepril. 2. New onset atrial fibrillation.  Today is rate controlled.  Continue metoprolol.  Will convert heparin drip over to Eliquis this evening. 3. Elevated troponin likely demand ischemia from acute on chronic diastolic congestive heart failure. 4. Hypokalemia.  Replace while giving IV Lasix. 5. Chronic kidney disease stage IIIa.  Watch closely with diuresis. 6. Hyperlipidemia unspecified on Lipitor      Code Status:     Code Status Orders  (From admission, onward)         Start     Ordered   02/13/20 1306  Limited resuscitation (code)  Continuous       Question Answer Comment  In the event of cardiac or respiratory ARREST: Perform CPR Yes   In the event of cardiac or respiratory ARREST: Perform Intubation/Mechanical Ventilation No   In the event of cardiac or respiratory ARREST: Perform Defibrillation or Cardioversion if indicated Yes   Antiarrhythmic drugs - Any drug used to treat arrhythmias Yes   Vasoactive drug - Any drug used to stabilize blood pressure Yes      02/13/20 1305        Code Status History    Date Active Date Inactive Code Status Order ID Comments User Context   02/13/2020 1206 02/13/2020 1305 Full Code [id number]  Lorretta Harp, MD ED   02/13/2020 1113 02/13/2020 1206 Partial Code [id number]  Lorretta Harp, MD ED   Advance Care Planning Activity     Family Communication: Wife at bedside Disposition Plan: Status is: Observation  Dispo: The patient is from:  Home              Anticipated d/c is to: Home              Anticipated d/c date is: Reevaluate for potential disposition on 02/15/2020 versus 02/16/2020 depending on whether or not he still wheezing.              Patient currently being diuresed with IV Lasix for acute on chronic diastolic congestive heart failure.  Time spent: 28 minutes  Geffrey Michaelsen [employer]

## 2020-02-15 DIAGNOSIS — I480 Paroxysmal atrial fibrillation: Secondary | ICD-10-CM | POA: Diagnosis not present

## 2020-02-15 DIAGNOSIS — I5033 Acute on chronic diastolic (congestive) heart failure: Secondary | ICD-10-CM

## 2020-02-15 DIAGNOSIS — D6869 Other thrombophilia: Secondary | ICD-10-CM

## 2020-02-15 DIAGNOSIS — I5031 Acute diastolic (congestive) heart failure: Secondary | ICD-10-CM | POA: Diagnosis not present

## 2020-02-15 DIAGNOSIS — N179 Acute kidney failure, unspecified: Secondary | ICD-10-CM | POA: Diagnosis not present

## 2020-02-15 DIAGNOSIS — N189 Chronic kidney disease, unspecified: Secondary | ICD-10-CM

## 2020-02-15 LAB — CBC
HCT: 35.8 % — ABNORMAL LOW (ref 39.0–52.0)
Hemoglobin: 12.5 g/dL — ABNORMAL LOW (ref 13.0–17.0)
MCH: 33.5 pg (ref 26.0–34.0)
MCHC: 34.9 g/dL (ref 30.0–36.0)
MCV: 96 fL (ref 80.0–100.0)
Platelets: 245 10*3/uL (ref 150–400)
RBC: 3.73 MIL/uL — ABNORMAL LOW (ref 4.22–5.81)
RDW: 13.4 % (ref 11.5–15.5)
WBC: 12 10*3/uL — ABNORMAL HIGH (ref 4.0–10.5)
nRBC: 0 % (ref 0.0–0.2)

## 2020-02-15 LAB — BASIC METABOLIC PANEL
Anion gap: 13 (ref 5–15)
BUN: 27 mg/dL — ABNORMAL HIGH (ref 8–23)
CO2: 25 mmol/L (ref 22–32)
Calcium: 9 mg/dL (ref 8.9–10.3)
Chloride: 98 mmol/L (ref 98–111)
Creatinine, Ser: 1.58 mg/dL — ABNORMAL HIGH (ref 0.61–1.24)
GFR, Estimated: 42 mL/min — ABNORMAL LOW (ref >60)
Glucose, Bld: 120 mg/dL — ABNORMAL HIGH (ref 70–99)
Potassium: 3.3 mmol/L — ABNORMAL LOW (ref 3.5–5.1)
Sodium: 136 mmol/L (ref 135–145)

## 2020-02-15 MED ORDER — FUROSEMIDE 20 MG PO TABS: 20.0000 mg | ORAL_TABLET | Freq: Every day | ORAL | 0 refills | Status: DC

## 2020-02-15 MED ORDER — APIXABAN 2.5 MG PO TABS
2.5000 mg | ORAL_TABLET | Freq: Two times a day (BID) | ORAL | 0 refills | Status: DC
Start: 2020-02-15 — End: 2020-03-07

## 2020-02-15 MED ORDER — POTASSIUM CHLORIDE CRYS ER 20 MEQ PO TBCR: 40.0000 meq | EXTENDED_RELEASE_TABLET | Freq: Once | ORAL | Status: DC

## 2020-02-15 MED ORDER — POTASSIUM CHLORIDE 20 MEQ PO PACK
40.0000 meq | PACK | Freq: Once | ORAL | Status: AC
  Administered 2020-02-15: 40 meq via ORAL
  Filled 2020-02-15: qty 2

## 2020-02-15 MED ORDER — METOPROLOL TARTRATE 25 MG PO TABS: 25.0000 mg | ORAL_TABLET | Freq: Two times a day (BID) | ORAL | 0 refills | Status: DC

## 2020-02-15 MED ORDER — APIXABAN 2.5 MG PO TABS: 2.5000 mg | ORAL_TABLET | Freq: Two times a day (BID) | ORAL | Status: DC

## 2020-02-15 MED ORDER — ALBUTEROL SULFATE (2.5 MG/3ML) 0.083% IN NEBU: 3.0000 mL | INHALATION_SOLUTION | RESPIRATORY_TRACT | Status: DC | PRN

## 2020-02-15 MED ORDER — ALBUTEROL SULFATE HFA 108 (90 BASE) MCG/ACT IN AERS: 2.0000 | INHALATION_SPRAY | Freq: Four times a day (QID) | RESPIRATORY_TRACT | 0 refills | Status: DC | PRN

## 2020-02-15 NOTE — Progress Notes (Signed)
   Heart Failure Nurse Navigator Note  HFpEF 55-60%.  Grade I diastolic dysfunction.  Right ventricular functions is normal.  Right ventricle is mildly dilated.  Severe LAE.  Moderate RAE.  Mild MR.  He presented with one month of complaining of SOB and cough.  Co morbidities:  Hypertension Hyperlipidemia CKD stage III New onset atrial fibrillation  Medications:  Amlodipine 5 mg daily Benazepril 10 mg daily ASA 81 mg daily Lipitor 10 mg daily Metoprolol tartrate 25 mg BID Eliquis 2.5 mg BID   Labs:  Sodium 136, potassium 3.3, BUN 27 (21), creatinine 1.58(1.4)  Weight 73.1(75.7) kg Intake and output not documented. BMI 25.25 BP 132/74 pulse 73     Assessment:  General- he is awake and alert, denies any CP or SOB.  HEENT- pupils are equal and non icteric, no JVD  Cardiac- heart tones are irregular.  Abdomen- soft, non tender  Musculoskeletal- no lower extremity edema, pulses palpable.  Psych- is pleasant and appropriate.  Neuro- speech is clear, moves all extremities without problems.    Wife was present today, discussed with her items that had discussed with her husband yesterday. She voices understanding.  Again spoke about eating at restaurants, she states with COVID they have not been doing that but do get" take out" frequently.  Went over 2000 mg sodium restriction and limiting fluid to 64 ounces, she states that he does not drink more than that normally.  She says she had not been reading labels but will start to do so.  They both did not have any further questions, he is being discharged home today.  Tresa Endo Rn, CHFN

## 2020-02-15 NOTE — Progress Notes (Addendum)
Discharge [place] Patient ID: Robert Frye, male   DOB: 1932-02-07, 86 y.o.   MRN: [id number] [facility]   PATIENT NAME: Robert Frye    MR#:  [id number]  DATE OF BIRTH:  1932/02/04  DATE OF ADMISSION:  02/13/2020 ADMITTING PHYSICIAN: Robert Harp, MD  DATE OF DISCHARGE: 02/15/2020 11:04 AM  PRIMARY CARE PHYSICIAN: Robert Nurse, MD    ADMISSION DIAGNOSIS:  Elevated troponin [R77.8] Atrial fibrillation with RVR (HCC) [I48.91] Atrial fibrillation, unspecified type (HCC) [I48.91]  DISCHARGE DIAGNOSIS:  Principal Problem:   Acute CHF (congestive heart failure) (HCC) Active Problems:   New onset atrial fibrillation (HCC)   Hypokalemia   Elevated troponin   HTN (hypertension)   HLD (hyperlipidemia)   CKD (chronic kidney disease), stage IIIa   SECONDARY DIAGNOSIS:   Past Medical History:  Diagnosis Date  . Dementia (HCC)   . Hyperlipidemia   . Hypertension   . Renal insufficiency   . Thyroid disease     HOSPITAL COURSE:   1. Acute on chronic diastolic congestive heart failure the patient was diuresed with IV Lasix. The patient did have wheezing on 02/14/2020. On 02/15/2020 lungs are clear. Patient will be discharged home on Lasix 20 mg daily. Patient already on metoprolol and benazepril. 2. New onset atrial fibrillation likely paroxysmal. Patient is rate controlled on low-dose metoprolol. Patient was initially started on heparin drip and converted over to Eliquis. Eliquis 30-day free card given. As outpatient if creatinine improves can increase Eliquis to 5 mg twice daily dosing but for right now Eliquis will be prescribed at 2.5 mg twice daily. Risk of bleeding explained to patient and patient's wife. 3. Acquired thrombophilia secondary to atrial fibrillation. Stroke risk higher with atrial fibrillation. Eliquis to reduce the risk of stroke. 4. Elevated troponin secondary to demand ischemia from acute on chronic diastolic  congestive heart failure. 5. Hypokalemia this was replaced during the hospital course. 6. Acute kidney injury on chronic kidney disease stage IIIb. Continue to watch creatinine with diuresis recommend checking a BMP as outpatient. 7. Hyperlipidemia unspecified on Lipitor. LDL 85.  DISCHARGE CONDITIONS:   Satisfactory  CONSULTS OBTAINED:  Treatment Team:  Robert Iba, MD  DRUG ALLERGIES:  No Known Allergies  DISCHARGE MEDICATIONS:   Allergies as of 02/15/2020   No Known Allergies     Medication List    STOP taking these medications   hydrochlorothiazide 12.5 MG tablet Commonly known as: HYDRODIURIL     TAKE these medications   albuterol 108 (90 Base) MCG/ACT inhaler Commonly known as: VENTOLIN HFA Inhale 2 puffs into the lungs every 6 (six) hours as needed for wheezing or shortness of breath.   amLODipine-benazepril 5-10 MG capsule Commonly known as: LOTREL Take 1 capsule by mouth daily.   apixaban 2.5 MG Tabs tablet Commonly known as: ELIQUIS Take 1 tablet (2.5 mg total) by mouth 2 (two) times daily.   aspirin 81 MG EC tablet Take 81 mg by mouth daily.   atorvastatin 10 MG tablet Commonly known as: LIPITOR Take 10 mg by mouth daily.   Cholecalciferol 50 MCG (2000 UT) Caps Take 2,000 Units by mouth daily.   cyanocobalamin 1000 MCG tablet Take 1,000 mcg by mouth daily.   furosemide 20 MG tablet Commonly known as: Lasix Take 1 tablet (20 mg total) by mouth daily. Start taking on: February 16, 2020   levothyroxine 75 MCG tablet Commonly known as: SYNTHROID Take 75 mcg by mouth daily before breakfast.  metoprolol tartrate 25 MG tablet Commonly known as: LOPRESSOR Take 1 tablet (25 mg total) by mouth 2 (two) times daily.        DISCHARGE INSTRUCTIONS:   Follow-up PMD 5 days Follow-up cardiology 1 to 2 weeks Follow-up CHF clinic  If you experience worsening of your admission symptoms, develop shortness of breath, life threatening  emergency, suicidal or homicidal thoughts you must seek medical attention immediately by calling 911 or calling your MD immediately  if symptoms less severe.  You Must read complete instructions/literature along with all the possible adverse reactions/side effects for all the Medicines you take and that have been prescribed to you. Take any new Medicines after you have completely understood and accept all the possible adverse reactions/side effects.   Please note  You were cared for by a hospitalist during your hospital stay. If you have any questions about your discharge medications or the care you received while you were in the hospital after you are discharged, you can call the unit and asked to speak with the hospitalist on call if the hospitalist that took care of you is not available. Once you are discharged, your primary care physician will handle any further medical issues. Please note that NO REFILLS for any discharge medications will be authorized once you are discharged, as it is imperative that you return to your primary care physician (or establish a relationship with a primary care physician if you do not have one) for your aftercare needs so that they can reassess your need for medications and monitor your lab values.    Today   CHIEF COMPLAINT:   Chief Complaint  Patient presents with  . Shortness of Breath    HISTORY OF PRESENT ILLNESS:  Robert Frye  is a 86 y.o. male came in with shortness of breath   VITAL SIGNS:  Blood pressure 132/74, pulse 73, temperature 98.3 F (36.8 C), temperature source Oral, resp. rate 20, height  (1.702 m), weight 73.1 kg, SpO2 91 %.   PHYSICAL EXAMINATION:  GENERAL:  86 y.o.-year-old patient lying in the bed with no acute distress.  EYES: Pupils equal, round, reactive to light and accommodation. No scleral icterus. HEENT: Head atraumatic, normocephalic. Oropharynx and nasopharynx clear. .  LUNGS: Normal breath sounds bilaterally,  no wheezing, rales,rhonchi or crepitation. No use of accessory muscles of respiration.  CARDIOVASCULAR: S1, S2 irregularly irregular. No murmurs, rubs, or gallops.  ABDOMEN: Soft, non-tender, non-distended. Bowel sounds present. No organomegaly or mass.  EXTREMITIES: No pedal edema, cyanosis, or clubbing.  NEUROLOGIC: Cranial nerves II through XII are intact. Muscle strength 5/5 in all extremities. Sensation intact. Gait not checked.  PSYCHIATRIC: The patient is alert and oriented x 3.  SKIN: No obvious rash, lesion, or ulcer.   DATA REVIEW:   CBC Recent Labs  Lab 02/15/20 0607  WBC 12.0*  HGB 12.5*  HCT 35.8*  PLT 245    Chemistries  Recent Labs  Lab 02/13/20 0735 02/13/20 0959 02/14/20 0410 02/15/20 0607  NA 134*  --    < > 136  K 3.4*  --    < > 3.3*  CL 99  --    < > 98  CO2 22  --    < > 25  GLUCOSE 117*  --    < > 120*  BUN 19  --    < > 27*  CREATININE 1.39*  --    < > 1.58*  CALCIUM 9.4  --    < >  9.0  MG  --  2.1  --   --   AST 28  --   --   --   ALT 24  --   --   --   ALKPHOS 96  --   --   --   BILITOT 2.3*  --   --   --    < > = values in this interval not displayed.     Microbiology Results  Results for orders placed or performed during the hospital encounter of 02/13/20  Resp Panel by RT-PCR (Flu A&B, Covid) Nasopharyngeal Swab     Status: None   Collection Time: 02/13/20  8:35 AM   Specimen: Nasopharyngeal Swab; Nasopharyngeal(NP) swabs in vial transport medium  Result Value Ref Range Status   SARS Coronavirus 2 by RT PCR NEGATIVE NEGATIVE Final    Comment: (NOTE) SARS-CoV-2 target nucleic acids are NOT DETECTED.  The SARS-CoV-2 RNA is generally detectable in upper respiratory specimens during the acute phase of infection. The lowest concentration of SARS-CoV-2 viral copies this assay can detect is 138 copies/mL. A negative result does not preclude SARS-Cov-2 infection and should not be used as the sole basis for treatment or other patient  management decisions. A negative result may occur with  improper specimen collection/handling, submission of specimen other than nasopharyngeal swab, presence of viral mutation(s) within the areas targeted by this assay, and inadequate number of viral copies(<138 copies/mL). A negative result must be combined with clinical observations, patient history, and epidemiological information. The expected result is Negative.  Fact Sheet for Patients:  BloggerCourse.com  Fact Sheet for Healthcare Providers:  SeriousBroker.it  This test is no t yet approved or cleared by the [place] FDA and  has been authorized for detection and/or diagnosis of SARS-CoV-2 by FDA under an Emergency Use Authorization (EUA). This EUA will remain  in effect (meaning this test can be used) for the duration of the COVID-19 declaration under Section 564(b)(1) of the Act, 21 U.S.C.section 360bbb-3(b)(1), unless the authorization is terminated  or revoked sooner.       Influenza A by PCR NEGATIVE NEGATIVE Final   Influenza B by PCR NEGATIVE NEGATIVE Final    Comment: (NOTE) The Xpert Xpress SARS-CoV-2/FLU/RSV plus assay is intended as an aid in the diagnosis of influenza from Nasopharyngeal swab specimens and should not be used as a sole basis for treatment. Nasal washings and aspirates are unacceptable for Xpert Xpress SARS-CoV-2/FLU/RSV testing.  Fact Sheet for Patients: BloggerCourse.com  Fact Sheet for Healthcare Providers: SeriousBroker.it  This test is not yet approved or cleared by the [place] FDA and has been authorized for detection and/or diagnosis of SARS-CoV-2 by FDA under an Emergency Use Authorization (EUA). This EUA will remain in effect (meaning this test can be used) for the duration of the COVID-19 declaration under Section 564(b)(1) of the Act, 21 U.S.C. section 360bbb-3(b)(1),  unless the authorization is terminated or revoked.  Performed at [facility], [address] [place] [place], [place] [address]     RADIOLOGY:  ECHOCARDIOGRAM COMPLETE  Result Date: 02/14/2020    ECHOCARDIOGRAM REPORT   Patient Name:   Robert Frye Date of Exam: 02/14/2020 Medical Rec #:  [id number]       Height:       67.0 in Accession #:    [id number]      Weight:       166.9 lb Date of Birth:  10-08-1931        BSA:  1.873 m Patient Age:    88 years        BP:           149/79 mmHg Patient Gender: M               HR:           91 bpm. Exam Location:  [facility] Procedure: 2D Echo, Color Doppler and Cardiac Doppler Indications:     I48.91 Atrial Fibrillation  History:         Patient has no prior history of Echocardiogram examinations.                  Signs/Symptoms:Dementia; Risk Factors:Hypertension and                  Dyslipidemia.  Sonographer:     Humphrey Rolls RDCS (AE) Referring Phys:  Kern Reap Brien Few NIU Diagnosing Phys: Adrian Blackwater MD  Sonographer Comments: Suboptimal subcostal window. IMPRESSIONS  1. Left ventricular ejection fraction, by estimation, is 55 to 60%. The left ventricle has normal function. The left ventricle has no regional wall motion abnormalities. There is severe eccentric left ventricular hypertrophy of the apical and basal-septal segments. Left ventricular diastolic parameters are consistent with Grade I diastolic dysfunction (impaired relaxation).  2. Right ventricular systolic function is normal. The right ventricular size is mildly enlarged.  3. Left atrial size was severely dilated.  4. Right atrial size was moderately dilated.  5. The mitral valve is normal in structure. Mild mitral valve regurgitation. No evidence of mitral stenosis.  6. The aortic valve is normal in structure. Aortic valve regurgitation is not visualized. No aortic stenosis is present.  7. The inferior vena cava is normal in size with greater than 50% respiratory variability, suggesting right  atrial pressure of 3 mmHg. Conclusion(s)/Recommendation(s): Findings consistent with hypertrophic obstructive cardiomyopathy. FINDINGS  Left Ventricle: Left ventricular ejection fraction, by estimation, is 55 to 60%. The left ventricle has normal function. The left ventricle has no regional wall motion abnormalities. The left ventricular internal cavity size was normal in size. There is  severe eccentric left ventricular hypertrophy of the apical and basal-septal segments. Left ventricular diastolic parameters are consistent with Grade I diastolic dysfunction (impaired relaxation). Right Ventricle: The right ventricular size is mildly enlarged. No increase in right ventricular wall thickness. Right ventricular systolic function is normal. Left Atrium: Left atrial size was severely dilated. Right Atrium: Right atrial size was moderately dilated. Pericardium: There is no evidence of pericardial effusion. Mitral Valve: The mitral valve is normal in structure. Mild mitral valve regurgitation. No evidence of mitral valve stenosis. MV peak gradient, 4.7 mmHg. The mean mitral valve gradient is 2.0 mmHg. Tricuspid Valve: The tricuspid valve is normal in structure. Tricuspid valve regurgitation is mild . No evidence of tricuspid stenosis. Aortic Valve: The aortic valve is normal in structure. Aortic valve regurgitation is not visualized. No aortic stenosis is present. Aortic valve mean gradient measures 3.0 mmHg. Aortic valve peak gradient measures 6.4 mmHg. Aortic valve area, by VTI measures 1.66 cm. Pulmonic Valve: The pulmonic valve was normal in structure. Pulmonic valve regurgitation is trivial. No evidence of pulmonic stenosis. Aorta: The aortic root is normal in size and structure. Venous: The inferior vena cava is normal in size with greater than 50% respiratory variability, suggesting right atrial pressure of 3 mmHg. IAS/Shunts: No atrial level shunt detected by color flow Doppler.  LEFT VENTRICLE PLAX 2D LVIDd:          3.66  cm  Diastology LVIDs:         2.50 cm  LV e' lateral:   4.79 cm/s LV PW:         1.48 cm  LV E/e' lateral: 21.7 LV IVS:        0.96 cm LVOT diam:     1.70 cm LV SV:         34 LV SV Index:   18 LVOT Area:     2.27 cm  RIGHT VENTRICLE RV Basal diam:  2.93 cm LEFT ATRIUM             Index       RIGHT ATRIUM           Index LA diam:        3.60 cm 1.92 cm/m  RA Area:     12.60 cm LA Vol (A2C):   27.7 ml 14.79 ml/m RA Volume:   25.80 ml  13.78 ml/m LA Vol (A4C):   58.3 ml 31.13 ml/m LA Biplane Vol: 43.8 ml 23.39 ml/m  AORTIC VALVE                   PULMONIC VALVE AV Area (Vmax):    1.55 cm    PV Vmax:       1.06 m/s AV Area (Vmean):   1.68 cm    PV Vmean:      71.500 cm/s AV Area (VTI):     1.66 cm    PV VTI:        0.174 m AV Vmax:           126.00 cm/s PV Peak grad:  4.5 mmHg AV Vmean:          84.000 cm/s PV Mean grad:  2.0 mmHg AV VTI:            0.204 m AV Peak Grad:      6.4 mmHg AV Mean Grad:      3.0 mmHg LVOT Vmax:         86.20 cm/s LVOT Vmean:        62.000 cm/s LVOT VTI:          0.149 m LVOT/AV VTI ratio: 0.73  AORTA Ao Root diam: 3.10 cm MITRAL VALVE MV Area (PHT): 4.44 cm     SHUNTS MV Peak grad:  4.7 mmHg     Systemic VTI:  0.15 m MV Mean grad:  2.0 mmHg     Systemic Diam: 1.70 cm MV Vmax:       1.08 m/s MV Vmean:      59.1 cm/s MV Decel Time: 171 msec MV E velocity: 104.00 cm/s Adrian Blackwater MD Electronically signed by Adrian Blackwater MD Signature Date/Time: 02/14/2020/11:49:08 AM    Final       Management plans discussed with the patient, family and they are in agreement.  CODE STATUS:     Code Status Orders  (From admission, onward)         Start     Ordered   02/13/20 1306  Limited resuscitation (code)  Continuous       Question Answer Comment  In the event of cardiac or respiratory ARREST: Perform CPR Yes   In the event of cardiac or respiratory ARREST: Perform Intubation/Mechanical Ventilation No   In the event of cardiac or respiratory ARREST: Perform  Defibrillation or Cardioversion if indicated Yes   Antiarrhythmic drugs - Any drug used to treat arrhythmias Yes   Vasoactive  drug - Any drug used to stabilize blood pressure Yes      02/13/20 1305        Code Status History    Date Active Date Inactive Code Status Order ID Comments User Context   02/13/2020 1206 02/13/2020 1305 Full Code [id number]  Robert HarpNiu, Xilin, MD ED   02/13/2020 1113 02/13/2020 1206 Partial Code [id number]  Robert HarpNiu, Xilin, MD ED   Advance Care Planning Activity      TOTAL TIME TAKING CARE OF THIS PATIENT: 34 minutes.    Robert Frye M.D on 02/15/2020 at 3:42 PM  Between 7am to 6pm - Pager - [phone]  After 6pm go to [email or web address] - password [email or web address]  [facility]  CC: Primary care physician; Robert NurseJohnston, John D, MD

## 2020-02-15 NOTE — Progress Notes (Addendum)
Progress Note  Patient Name: Robert Frye Date of Encounter: 02/15/2020  Primary Cardiologist: New to [facility] - consult by Duke Salvia   Subjective   No documented UOP over the past 24 hours. Net - 622 mL for the admission. Weight 75.7-->73.1 kg over the past 24 hours. BUN/SCr 21/1.40-->27/1.58, potassium 3.5-->3.3, WBC 9.2-->12.0, HGB 13.7-->12.5. Echo as read by outside group showed an EF of 55-60%, no RWMA, severe eccentric LVH of the apical and basal-septal segments, Gr1DD, normal RVSF, mildly enlarged RV cavity size, severely dilated left atrium, moderately dilated right atrium, mild MR.  Dyspnea continues to improve. Mild nonproductive cough. No chest pain, palpitations, dizziness, presyncope or syncope. No lower extremity swelling. Has ambulated without issues.   Inpatient Medications    Scheduled Meds: . albuterol  3 mL Inhalation QID  . amLODipine  5 mg Oral Daily   And  . benazepril  10 mg Oral Daily  . apixaban  5 mg Oral BID  . aspirin EC  81 mg Oral Daily  . atorvastatin  10 mg Oral Daily  . cholecalciferol  2,000 Units Oral Daily  . furosemide  20 mg Intravenous BID  . levothyroxine  75 mcg Oral QAC breakfast  . metoprolol tartrate  25 mg Oral BID  . potassium chloride  10 mEq Oral BID  . senna-docusate  1 tablet Oral BID  . cyanocobalamin  1,000 mcg Oral Q3 days   Continuous Infusions:  PRN Meds: acetaminophen, dextromethorphan-guaiFENesin, hydrALAZINE, morphine injection, nitroGLYCERIN, ondansetron (ZOFRAN) IV   Vital Signs    Vitals:   02/15/20 0100 02/15/20 0320 02/15/20 0742 02/15/20 0818  BP:  128/84 132/74   Pulse:  76 72 73  Resp:  18 18 20   Temp:  98 F (36.7 C) 98.3 F (36.8 C)   TempSrc:  Oral Oral   SpO2:  94% 91% 91%  Weight: 73.1 kg     Height:       No intake or output data in the 24 hours ending 02/15/20 0830 Filed Weights   02/13/20 1427 02/14/20 0412 02/15/20 0100  Weight: 75.2 kg 75.7 kg 73.1 kg    Telemetry    Afib with  ventricular rates overall well controlled in the 80s bpm with occasional peaks into the 1-teens to 130s bpm - Personally Reviewed  ECG    No new tracings - Personally Reviewed  Physical Exam   GEN: No acute distress.   Neck: No JVD. Cardiac: Irregularly irregular, no murmurs, rubs, or gallops.  Respiratory: Faint wheezing.  GI: Soft, nontender, non-distended.   MS: No edema; No deformity. Neuro:  Alert and oriented x 3; Nonfocal.  Psych: Normal affect.  Labs    Chemistry Recent Labs  Lab 02/13/20 0735 02/14/20 0410 02/15/20 0607  NA 134* 136 136  K 3.4* 3.5 3.3*  CL 99 98 98  CO2 22 25 25   GLUCOSE 117* 121* 120*  BUN 19 21 27*  CREATININE 1.39* 1.40* 1.58*  CALCIUM 9.4 8.9 9.0  PROT 7.4  --   --   ALBUMIN 4.1  --   --   AST 28  --   --   ALT 24  --   --   ALKPHOS 96  --   --   BILITOT 2.3*  --   --   GFRNONAA 49* 49* 42*  ANIONGAP 13 13 13      Hematology Recent Labs  Lab 02/13/20 0735 02/15/20 0607  WBC 9.2 12.0*  RBC 4.11* 3.73*  HGB  13.7 12.5*  HCT 39.8 35.8*  MCV 96.8 96.0  MCH 33.3 33.5  MCHC 34.4 34.9  RDW 13.2 13.4  PLT 304 245    Cardiac EnzymesNo results for input(s): TROPONINI in the last 168 hours. No results for input(s): TROPIPOC in the last 168 hours.   BNP Recent Labs  Lab 02/13/20 0735  BNP 1,073.4*     DDimer No results for input(s): DDIMER in the last 168 hours.   Radiology    DG Chest 2 View  Result Date: 02/13/2020 IMPRESSION: Mild left basilar opacification which may be due to small effusion with atelectasis, although infection is possible. Electronically Signed   By: Elberta Fortis M.D.   On: 02/13/2020 08:56    Cardiac Studies   2D echo, as read by outside cardiology group 02/14/2020: 1. Left ventricular ejection fraction, by estimation, is 55 to 60%. The  left ventricle has normal function. The left ventricle has no regional  wall motion abnormalities. There is severe eccentric left ventricular  hypertrophy of  the apical and  basal-septal segments. Left ventricular diastolic parameters are  consistent with Grade I diastolic dysfunction (impaired relaxation).  2. Right ventricular systolic function is normal. The right ventricular  size is mildly enlarged.  3. Left atrial size was severely dilated.  4. Right atrial size was moderately dilated.  5. The mitral valve is normal in structure. Mild mitral valve  regurgitation. No evidence of mitral stenosis.  6. The aortic valve is normal in structure. Aortic valve regurgitation is  not visualized. No aortic stenosis is present.  7. The inferior vena cava is normal in size with greater than 50%  respiratory variability, suggesting right atrial pressure of 3 mmHg.   Conclusion(s)/Recommendation(s): Findings consistent with hypertrophic  obstructive cardiomyopathy.   Patient Profile     86 y.o. male with history of CKD stage III, mild dementia, HTN, and HLD admitted on 11/21 with progressive dyspnea, CHF, and newly diagnosed Afib with RVR.   Assessment & Plan    1. Acute diastolic CHF: -Volume status appears to be improved -With slight bump in BUN/SCr, hold IV Lasix this afternoon, morning dose has already been given  -Possibly resume oral Lasix at 20 mg daily on 11/24  2. LVH: -Appears to be out of proportion to his history of hypertension  -Consider outpatient cardiac MRI after DCCV  3. Persistent Afib: -Ventricular rates are overall well controlled  -Continue Lopressor -CHADS2VASc 4 (CHF, HTN, age x 2) -Eliquis 2.5 mg bid (age/SCr) -If he remains in Afib in 3-4 weeks time, after he has been adequately anticoagulated during that time frame without interruption, consider DCCV -Replete potassium as below -TSH normal   4. Demand ischemia: -Likely in the setting of acute on chronic diastolic CHF with eccentric LVH, Afib with RVR, and CKD -No chest pain leading up to the admission, though he did have progressive dyspnea -Mildly  elevated HS-Tn peaking at 125 with a flat trend -Echo with normal LVSF as above -No plans for inpatient ischemic evaluation at this time  5. HTN: -Blood pressure currently well controlled -Continue amlodipine, benazepril, and Lopressor   6. CKD stage III: -Slight up trending BUN/SCr this morning -Has already received IV Lasix 20 mg this morning -Hold further IV diuretic   7. HLD: -LDL 85 this admission -Lipitor   8. Hypokalemia: -Replete to goal 4.0  For questions or updates, please contact [facility] Please consult [email or web address] for contact info under Cardiology/STEMI.    Signed, Eula Listen, PA-C  [facility] Pager: [phone] 02/15/2020, 8:30 AM

## 2020-02-15 NOTE — Discharge Instructions (Signed)
Atrial Fibrillation  Atrial fibrillation is a type of heartbeat that is irregular or fast. If you have this condition, your heart beats without any order. This makes it hard for your heart to pump blood in a normal way. Atrial fibrillation may come and go, or it may become a long-lasting problem. If this condition is not treated, it can put you at higher risk for stroke, heart failure, and other heart problems. What are the causes? This condition may be caused by diseases that damage the heart. They include:  High blood pressure.  Heart failure.  Heart valve disease.  Heart surgery. Other causes include:  Diabetes.  Thyroid disease.  Being overweight.  Kidney disease. Sometimes the cause is not known. What increases the risk? You are more likely to develop this condition if:  You are older.  You smoke.  You exercise often and very hard.  You have a family history of this condition.  You are a man.  You use drugs.  You drink a lot of alcohol.  You have lung conditions, such as emphysema, pneumonia, or COPD.  You have sleep apnea. What are the signs or symptoms? Common symptoms of this condition include:  A feeling that your heart is beating very fast.  Chest pain or discomfort.  Feeling short of breath.  Suddenly feeling light-headed or weak.  Getting tired easily during activity.  Fainting.  Sweating. In some cases, there are no symptoms. How is this treated? Treatment for this condition depends on underlying conditions and how you feel when you have atrial fibrillation. They include:  Medicines to: ? Prevent blood clots. ? Treat heart rate or heart rhythm problems.  Using devices, such as a pacemaker, to correct heart rhythm problems.  Doing surgery to remove the part of the heart that sends bad signals.  Closing an area where clots can form in the heart (left atrial appendage). In some cases, your doctor will treat other underlying  conditions. Follow these instructions at home: Medicines  Take over-the-counter and prescription medicines only as told by your doctor.  Do not take any new medicines without first talking to your doctor.  If you are taking blood thinners: ? Talk with your doctor before you take any medicines that have aspirin or NSAIDs, such as ibuprofen, in them. ? Take your medicine exactly as told by your doctor. Take it at the same time each day. ? Avoid activities that could hurt or bruise you. Follow instructions about how to prevent falls. ? Wear a bracelet that says you are taking blood thinners. Or, carry a card that lists what medicines you take. Lifestyle      Do not use any products that have nicotine or tobacco in them. These include cigarettes, e-cigarettes, and chewing tobacco. If you need help quitting, ask your doctor.  Eat heart-healthy foods. Talk with your doctor about the right eating plan for you.  Exercise regularly as told by your doctor.  Do not drink alcohol.  Lose weight if you are overweight.  Do not use drugs, including cannabis. General instructions  If you have a condition that causes breathing to stop for a short period of time (apnea), treat it as told by your doctor.  Keep a healthy weight. Do not use diet pills unless your doctor says they are safe for you. Diet pills may make heart problems worse.  Keep all follow-up visits as told by your doctor. This is important. Contact a doctor if:  You notice a change   in the speed, rhythm, or strength of your heartbeat.  You are taking a blood-thinning medicine and you get more bruising.  You get tired more easily when you move or exercise.  You have a sudden change in weight. Get help right away if:   You have pain in your chest or your belly (abdomen).  You have trouble breathing.  You have side effects of blood thinners, such as blood in your vomit, poop (stool), or pee (urine), or bleeding that cannot  stop.  You have any signs of a stroke. "BE FAST" is an easy way to remember the main warning signs: ? B - Balance. Signs are dizziness, sudden trouble walking, or loss of balance. ? E - Eyes. Signs are trouble seeing or a change in how you see. ? F - Face. Signs are sudden weakness or loss of feeling in the face, or the face or eyelid drooping on one side. ? A - Arms. Signs are weakness or loss of feeling in an arm. This happens suddenly and usually on one side of the body. ? S - Speech. Signs are sudden trouble speaking, slurred speech, or trouble understanding what people say. ? T - Time. Time to call emergency services. Write down what time symptoms started.  You have other signs of a stroke, such as: ? A sudden, very bad headache with no known cause. ? Feeling like you may vomit (nausea). ? Vomiting. ? A seizure. These symptoms may be an emergency. Do not wait to see if the symptoms will go away. Get medical help right away. Call your local emergency services (911 in the [place] Do not drive yourself to the hospital. Summary  Atrial fibrillation is a type of heartbeat that is irregular or fast.  You are at higher risk of this condition if you smoke, are older, have diabetes, or are overweight.  Follow your doctor's instructions about medicines, diet, exercise, and follow-up visits.  Get help right away if you have signs or symptoms of a stroke.  Get help right away if you cannot catch your breath, or you have chest pain or discomfort. This information is not intended to replace advice given to you by your health care provider. Make sure you discuss any questions you have with your health care provider. Document Revised: 09/02/2018 Document Reviewed: 09/02/2018 Elsevier Patient Education  [place].   Heart Failure, Self Care Heart failure is a serious condition. This sheet explains things you need to do to take care of yourself at home. To help you stay as healthy as  possible, you may be asked to change your diet, take certain medicines, and make other changes in your life. Your doctor may also give you more specific instructions. If you have problems or questions, call your doctor. What are the risks? Having heart failure makes it more likely for you to have some problems. These problems can get worse if you do not take good care of yourself. Problems may include:  Blood clotting problems. This may cause a stroke.  Damage to the kidneys, liver, or lungs.  Abnormal heart rhythms. Supplies needed:  Scale for weighing yourself.  Blood pressure monitor.  Notebook.  Medicines. How to care for yourself when you have heart failure Medicines Take over-the-counter and prescription medicines only as told by your doctor. Take your medicines every day.  Do not stop taking your medicine unless your doctor tells you to do so.  Do not skip any medicines.  Get your prescriptions  refilled before you run out of medicine. This is important. Eating and drinking   Eat heart-healthy foods. Talk with a diet specialist (dietitian) to create an eating plan.  Choose foods that: ? Have no trans fat. ? Are low in saturated fat and cholesterol.  Choose healthy foods, such as: ? Fresh or frozen fruits and vegetables. ? Fish. ? Low-fat (lean) meats. ? Legumes, such as beans, peas, and lentils. ? Fat-free or low-fat dairy products. ? Whole-grain foods. ? High-fiber foods.  Limit salt (sodium) if told by your doctor. Ask your diet specialist to tell you which seasonings are healthy for your heart.  Cook in healthy ways instead of frying. Healthy ways of cooking include roasting, grilling, broiling, baking, poaching, steaming, and stir-frying.  Limit how much fluid you drink, if told by your doctor. Alcohol use  Do not drink alcohol if: ? Your doctor tells you not to drink. ? Your heart was damaged by alcohol, or you have very bad heart failure. ? You are  pregnant, may be pregnant, or are planning to become pregnant.  If you drink alcohol: ? Limit how much you use to:  0-1 drink a day for women.  0-2 drinks a day for men. ? Be aware of how much alcohol is in your drink. In the [place] one drink equals one 12 oz bottle of beer (355 mL), one 5 oz glass of wine (148 mL), or one 1 oz glass of hard liquor (44 mL). Lifestyle   Do not use any products that contain nicotine or tobacco, such as cigarettes, e-cigarettes, and chewing tobacco. If you need help quitting, ask your doctor. ? Do not use nicotine gum or patches before talking to your doctor.  Do not use illegal drugs.  Lose weight if told by your doctor.  Do physical activity if told by your doctor. Talk to your doctor before you begin an exercise if: ? You are an older adult. ? You have very bad heart failure.  Learn to manage stress. If you need help, ask your doctor.  Get rehab (rehabilitation) to help you stay independent and to help with your quality of life.  Plan time to rest when you get tired. Check weight and blood pressure   Weigh yourself every day. This will help you to know if fluid is building up in your body. ? Weigh yourself every morning after you pee (urinate) and before you eat breakfast. ? Wear the same amount of clothing each time. ? Write down your daily weight. Give your record to your doctor.  Check and write down your blood pressure as told by your doctor.  Check your pulse as told by your doctor. Dealing with very hot and very cold weather  If it is very hot: ? Avoid activities that take a lot of energy. ? Use air conditioning or fans, or find a cooler place. ? Avoid caffeine and alcohol. ? Wear clothing that is loose-fitting, lightweight, and light-colored.  If it is very cold: ? Avoid activities that take a lot of energy. ? Layer your clothes. ? Wear mittens or gloves, a hat, and a scarf when you go outside. ? Avoid alcohol. Follow these  instructions at home:  Stay up to date with shots (vaccines). Get pneumococcal and flu (influenza) shots.  Keep all follow-up visits as told by your doctor. This is important. Contact a doctor if:  You gain weight quickly.  You have increasing shortness of breath.  You cannot do your normal  activities.  You get tired easily.  You cough a lot.  You don't feel like eating or feel like you may vomit (nauseous).  You become puffy (swell) in your hands, feet, ankles, or belly (abdomen).  You cannot sleep well because it is hard to breathe.  You feel like your heart is beating fast (palpitations).  You get dizzy when you stand up. Get help right away if:  You have trouble breathing.  You or someone else notices a change in your behavior, such as having trouble staying awake.  You have chest pain or discomfort.  You pass out (faint). These symptoms may be an emergency. Do not wait to see if the symptoms will go away. Get medical help right away. Call your local emergency services (911 in the [place] Do not drive yourself to the hospital. Summary  Heart failure is a serious condition. To care for yourself, you may have to change your diet, take medicines, and make other lifestyle changes.  Take your medicines every day. Do not stop taking them unless your doctor tells you to do so.  Eat heart-healthy foods, such as fresh or frozen fruits and vegetables, fish, lean meats, legumes, fat-free or low-fat dairy products, and whole-grain or high-fiber foods.  Ask your doctor if you can drink alcohol. You may have to stop alcohol use if you have very bad heart failure.  Contact your doctor if you gain weight quickly or feel that your heart is beating too fast. Get help right away if you pass out, or have chest pain or trouble breathing. This information is not intended to replace advice given to you by your health care provider. Make sure you discuss any questions you have with your  health care provider. Document Revised: 06/23/2018 Document Reviewed: 06/24/2018 Elsevier Patient Education  2020 [employer].

## 2020-02-15 NOTE — Discharge Summary (Signed)
Alford HighlandWieting, Queenie Aufiero, MD  Physician  Internal Medicine  Progress Notes     Addendum  Date of Service:  02/15/2020 11:04 AM          Expand All Collapse All  Show:Clear all [x] Manual[x] Template[] Copied  Added by: [x] Isyss Espinal, MD  [] Hover for details Discharge [place] Patient ID: Raford PitcherJerry R Molina, male   DOB: 12/09/1931, 86 y.o.   MRN: [id number] [facility] [place] [facility]   PATIENT NAME: Ronny FlurryJerry Vorndran    MR#:  [id number]  DATE OF BIRTH:  04/26/1931  DATE OF ADMISSION:  02/13/2020  ADMITTING PHYSICIAN: Lorretta HarpXilin Niu, MD  DATE OF DISCHARGE: 02/15/2020 11:04 AM  PRIMARY CARE PHYSICIAN: Gracelyn NurseJohnston, John D, MD    ADMISSION DIAGNOSIS:  Elevated troponin [R77.8] Atrial fibrillation with RVR (HCC) [I48.91] Atrial fibrillation, unspecified type (HCC) [I48.91]  DISCHARGE DIAGNOSIS:  Principal Problem:   Acute CHF (congestive heart failure) (HCC) Active Problems:   New onset atrial fibrillation (HCC)   Hypokalemia   Elevated troponin   HTN (hypertension)   HLD (hyperlipidemia)   CKD (chronic kidney disease), stage IIIa   SECONDARY DIAGNOSIS:       Past Medical History:  Diagnosis Date  . Dementia (HCC)   . Hyperlipidemia   . Hypertension   . Renal insufficiency   . Thyroid disease     HOSPITAL COURSE:   1. Acute on chronic diastolic congestive heart failure the patient was diuresed with IV Lasix. The patient did have wheezing on 02/14/2020. On 02/15/2020 lungs are clear. Patient will be discharged home on Lasix 20 mg daily. Patient already on metoprolol and benazepril. 2. New onset atrial fibrillation likely paroxysmal. Patient is rate controlled on low-dose metoprolol. Patient was initially started on heparin drip and converted over to Eliquis. Eliquis 30-day free card given. As outpatient if creatinine improves can increase Eliquis to 5 mg twice daily dosing but for right now Eliquis will be prescribed at 2.5 mg twice  daily. Risk of bleeding explained to patient and patient's wife. 3. Acquired thrombophilia secondary to atrial fibrillation. Stroke risk higher with atrial fibrillation. Eliquis to reduce the risk of stroke. 4. Elevated troponin secondary to demand ischemia from acute on chronic diastolic congestive heart failure. 5. Hypokalemia this was replaced during the hospital course. 6. Acute kidney injury on chronic kidney disease stage IIIb. Continue to watch creatinine with diuresis recommend checking a BMP as outpatient. 7. Hyperlipidemia unspecified on Lipitor. LDL 85.  DISCHARGE CONDITIONS:   Satisfactory  CONSULTS OBTAINED:  Treatment Team:  Antonieta IbaGollan, Timothy J, MD  DRUG ALLERGIES:  No Known Allergies  DISCHARGE MEDICATIONS:   Allergies as of 02/15/2020   No Known Allergies        Medication List    STOP taking these medications   hydrochlorothiazide 12.5 MG tablet Commonly known as: HYDRODIURIL     TAKE these medications   albuterol 108 (90 Base) MCG/ACT inhaler Commonly known as: VENTOLIN HFA Inhale 2 puffs into the lungs every 6 (six) hours as needed for wheezing or shortness of breath.   amLODipine-benazepril 5-10 MG capsule Commonly known as: LOTREL Take 1 capsule by mouth daily.   apixaban 2.5 MG Tabs tablet Commonly known as: ELIQUIS Take 1 tablet (2.5 mg total) by mouth 2 (two) times daily.   aspirin 81 MG EC tablet Take 81 mg by mouth daily.   atorvastatin 10 MG tablet Commonly known as: LIPITOR Take 10 mg by mouth daily.   Cholecalciferol 50 MCG (2000 UT) Caps Take 2,000 Units  by mouth daily.   cyanocobalamin 1000 MCG tablet Take 1,000 mcg by mouth daily.   furosemide 20 MG tablet Commonly known as: Lasix Take 1 tablet (20 mg total) by mouth daily. Start taking on: February 16, 2020   levothyroxine 75 MCG tablet Commonly known as: SYNTHROID Take 75 mcg by mouth daily before breakfast.   metoprolol tartrate 25 MG  tablet Commonly known as: LOPRESSOR Take 1 tablet (25 mg total) by mouth 2 (two) times daily.        DISCHARGE INSTRUCTIONS:   Follow-up PMD 5 days Follow-up cardiology 1 to 2 weeks Follow-up CHF clinic  If you experience worsening of your admission symptoms, develop shortness of breath, life threatening emergency, suicidal or homicidal thoughts you must seek medical attention immediately by calling 911 or calling your MD immediately  if symptoms less severe.  You Must read complete instructions/literature along with all the possible adverse reactions/side effects for all the Medicines you take and that have been prescribed to you. Take any new Medicines after you have completely understood and accept all the possible adverse reactions/side effects.   Please note  You were cared for by a hospitalist during your hospital stay. If you have any questions about your discharge medications or the care you received while you were in the hospital after you are discharged, you can call the unit and asked to speak with the hospitalist on call if the hospitalist that took care of you is not available. Once you are discharged, your primary care physician will handle any further medical issues. Please note that NO REFILLS for any discharge medications will be authorized once you are discharged, as it is imperative that you return to your primary care physician (or establish a relationship with a primary care physician if you do not have one) for your aftercare needs so that they can reassess your need for medications and monitor your lab values.    Today   CHIEF COMPLAINT:      Chief Complaint  Patient presents with  . Shortness of Breath    HISTORY OF PRESENT ILLNESS:  Fitz Matsuo  is a 86 y.o. male came in with shortness of breath   VITAL SIGNS:  Blood pressure 132/74, pulse 73, temperature 98.3 F (36.8 C), temperature source Oral, resp. rate 20, height 5\' 7"  (1.702  m), weight 73.1 kg, SpO2 91 %.   PHYSICAL EXAMINATION:  GENERAL:  86 y.o.-year-old patient lying in the bed with no acute distress.  EYES: Pupils equal, round, reactive to light and accommodation. No scleral icterus. HEENT: Head atraumatic, normocephalic. Oropharynx and nasopharynx clear. .  LUNGS: Normal breath sounds bilaterally, no wheezing, rales,rhonchi or crepitation. No use of accessory muscles of respiration.  CARDIOVASCULAR: S1, S2 irregularly irregular. No murmurs, rubs, or gallops.  ABDOMEN: Soft, non-tender, non-distended. Bowel sounds present. No organomegaly or mass.  EXTREMITIES: No pedal edema, cyanosis, or clubbing.  NEUROLOGIC: Cranial nerves II through XII are intact. Muscle strength 5/5 in all extremities. Sensation intact. Gait not checked.  PSYCHIATRIC: The patient is alert and oriented x 3.  SKIN: No obvious rash, lesion, or ulcer.   DATA REVIEW:   CBC Last Labs      Recent Labs  Lab 02/15/20 0607  WBC 12.0*  HGB 12.5*  HCT 35.8*  PLT 245      Chemistries  Last Labs         Recent Labs  Lab 02/13/20 0735 02/13/20 0959 02/14/20 0410 02/15/20 0607  NA 134*  --    < >  136  K 3.4*  --    < > 3.3*  CL 99  --    < > 98  CO2 22  --    < > 25  GLUCOSE 117*  --    < > 120*  BUN 19  --    < > 27*  CREATININE 1.39*  --    < > 1.58*  CALCIUM 9.4  --    < > 9.0  MG  --  2.1  --   --   AST 28  --   --   --   ALT 24  --   --   --   ALKPHOS 96  --   --   --   BILITOT 2.3*  --   --   --    < > = values in this interval not displayed.       Microbiology Results  >  Results for orders placed or performed during the hospital encounter of 02/13/20  Resp Panel by RT-PCR (Flu A&B, Covid) Nasopharyngeal Swab     Status: None   Collection Time: 02/13/20  8:35 AM   Specimen: Nasopharyngeal Swab; Nasopharyngeal(NP) swabs in vial transport medium  Result Value Ref Range Status   SARS Coronavirus 2 by RT PCR NEGATIVE NEGATIVE Final    Comment:  (NOTE) SARS-CoV-2 target nucleic acids are NOT DETECTED.  The SARS-CoV-2 RNA is generally detectable in upper respiratory specimens during the acute phase of infection. The lowest concentration of SARS-CoV-2 viral copies this assay can detect is 138 copies/mL. A negative result does not preclude SARS-Cov-2 infection and should not be used as the sole basis for treatment or other patient management decisions. A negative result may occur with  improper specimen collection/handling, submission of specimen other than nasopharyngeal swab, presence of viral mutation(s) within the areas targeted by this assay, and inadequate number of viral copies(<138 copies/mL). A negative result must be combined with clinical observations, patient history, and epidemiological information. The expected result is Negative.  Fact Sheet for Patients:  BloggerCourse.com  Fact Sheet for Healthcare Providers:  SeriousBroker.it  This test is no t yet approved or cleared by the [place] FDA and  has been authorized for detection and/or diagnosis of SARS-CoV-2 by FDA under an Emergency Use Authorization (EUA). This EUA will remain  in effect (meaning this test can be used) for the duration of the COVID-19 declaration under Section 564(b)(1) of the Act, 21 U.S.C.section 360bbb-3(b)(1), unless the authorization is terminated  or revoked sooner.       Influenza A by PCR NEGATIVE NEGATIVE Final   Influenza B by PCR NEGATIVE NEGATIVE Final    Comment: (NOTE) The Xpert Xpress SARS-CoV-2/FLU/RSV plus assay is intended as an aid in the diagnosis of influenza from Nasopharyngeal swab specimens and should not be used as a sole basis for treatment. Nasal washings and aspirates are unacceptable for Xpert Xpress SARS-CoV-2/FLU/RSV testing.  Fact Sheet for Patients: BloggerCourse.com  Fact Sheet for Healthcare  Providers: SeriousBroker.it  This test is not yet approved or cleared by the [place] FDA and has been authorized for detection and/or diagnosis of SARS-CoV-2 by FDA under an Emergency Use Authorization (EUA). This EUA will remain in effect (meaning this test can be used) for the duration of the COVID-19 declaration under Section 564(b)(1) of the Act, 21 U.S.C. section 360bbb-3(b)(1), unless the authorization is terminated or revoked.  Performed at [facility], [address] [place], [place] [address]  RADIOLOGY:   Imaging Results (Last 48 hours)  ECHOCARDIOGRAM COMPLETE  Result Date: 02/14/2020    ECHOCARDIOGRAM REPORT   Patient Name:   JOVANN LUSE Date of Exam: 02/14/2020 Medical Rec #:  [id number]       Height:       67.0 in Accession #:    [id number]      Weight:       166.9 lb Date of Birth:  15-May-1931        BSA:          1.873 m Patient Age:    86 years        BP:           149/79 mmHg Patient Gender: M               HR:           91 bpm. Exam Location:  [facility] Procedure: 2D Echo, Color Doppler and Cardiac Doppler Indications:     I48.91 Atrial Fibrillation  History:         Patient has no prior history of Echocardiogram examinations.                  Signs/Symptoms:Dementia; Risk Factors:Hypertension and                  Dyslipidemia.  Sonographer:     Humphrey Rolls RDCS (AE) Referring Phys:  Kern Reap Brien Few NIU Diagnosing Phys: Adrian Blackwater MD  Sonographer Comments: Suboptimal subcostal window. IMPRESSIONS  1. Left ventricular ejection fraction, by estimation, is 55 to 60%. The left ventricle has normal function. The left ventricle has no regional wall motion abnormalities. There is severe eccentric left ventricular hypertrophy of the apical and basal-septal segments. Left ventricular diastolic parameters are consistent with Grade I diastolic dysfunction (impaired relaxation).  2. Right ventricular systolic function is normal. The right  ventricular size is mildly enlarged.  3. Left atrial size was severely dilated.  4. Right atrial size was moderately dilated.  5. The mitral valve is normal in structure. Mild mitral valve regurgitation. No evidence of mitral stenosis.  6. The aortic valve is normal in structure. Aortic valve regurgitation is not visualized. No aortic stenosis is present.  7. The inferior vena cava is normal in size with greater than 50% respiratory variability, suggesting right atrial pressure of 3 mmHg. Conclusion(s)/Recommendation(s): Findings consistent with hypertrophic obstructive cardiomyopathy. FINDINGS  Left Ventricle: Left ventricular ejection fraction, by estimation, is 55 to 60%. The left ventricle has normal function. The left ventricle has no regional wall motion abnormalities. The left ventricular internal cavity size was normal in size. There is  severe eccentric left ventricular hypertrophy of the apical and basal-septal segments. Left ventricular diastolic parameters are consistent with Grade I diastolic dysfunction (impaired relaxation). Right Ventricle: The right ventricular size is mildly enlarged. No increase in right ventricular wall thickness. Right ventricular systolic function is normal. Left Atrium: Left atrial size was severely dilated. Right Atrium: Right atrial size was moderately dilated. Pericardium: There is no evidence of pericardial effusion. Mitral Valve: The mitral valve is normal in structure. Mild mitral valve regurgitation. No evidence of mitral valve stenosis. MV peak gradient, 4.7 mmHg. The mean mitral valve gradient is 2.0 mmHg. Tricuspid Valve: The tricuspid valve is normal in structure. Tricuspid valve regurgitation is mild . No evidence of tricuspid stenosis. Aortic Valve: The aortic valve is normal in structure. Aortic valve regurgitation is not visualized. No aortic stenosis is present. Aortic valve mean gradient  measures 3.0 mmHg. Aortic valve peak gradient measures 6.4 mmHg. Aortic  valve area, by VTI measures 1.66 cm. Pulmonic Valve: The pulmonic valve was normal in structure. Pulmonic valve regurgitation is trivial. No evidence of pulmonic stenosis. Aorta: The aortic root is normal in size and structure. Venous: The inferior vena cava is normal in size with greater than 50% respiratory variability, suggesting right atrial pressure of 3 mmHg. IAS/Shunts: No atrial level shunt detected by color flow Doppler.  LEFT VENTRICLE PLAX 2D LVIDd:         3.66 cm  Diastology LVIDs:         2.50 cm  LV e' lateral:   4.79 cm/s LV PW:         1.48 cm  LV E/e' lateral: 21.7 LV IVS:        0.96 cm LVOT diam:     1.70 cm LV SV:         34 LV SV Index:   18 LVOT Area:     2.27 cm  RIGHT VENTRICLE RV Basal diam:  2.93 cm LEFT ATRIUM             Index       RIGHT ATRIUM           Index LA diam:        3.60 cm 1.92 cm/m  RA Area:     12.60 cm LA Vol (A2C):   27.7 ml 14.79 ml/m RA Volume:   25.80 ml  13.78 ml/m LA Vol (A4C):   58.3 ml 31.13 ml/m LA Biplane Vol: 43.8 ml 23.39 ml/m  AORTIC VALVE                   PULMONIC VALVE AV Area (Vmax):    1.55 cm    PV Vmax:       1.06 m/s AV Area (Vmean):   1.68 cm    PV Vmean:      71.500 cm/s AV Area (VTI):     1.66 cm    PV VTI:        0.174 m AV Vmax:           126.00 cm/s PV Peak grad:  4.5 mmHg AV Vmean:          84.000 cm/s PV Mean grad:  2.0 mmHg AV VTI:            0.204 m AV Peak Grad:      6.4 mmHg AV Mean Grad:      3.0 mmHg LVOT Vmax:         86.20 cm/s LVOT Vmean:        62.000 cm/s LVOT VTI:          0.149 m LVOT/AV VTI ratio: 0.73  AORTA Ao Root diam: 3.10 cm MITRAL VALVE MV Area (PHT): 4.44 cm     SHUNTS MV Peak grad:  4.7 mmHg     Systemic VTI:  0.15 m MV Mean grad:  2.0 mmHg     Systemic Diam: 1.70 cm MV Vmax:       1.08 m/s MV Vmean:      59.1 cm/s MV Decel Time: 171 msec MV E velocity: 104.00 cm/s Adrian Blackwater MD Electronically signed by Adrian Blackwater MD Signature Date/Time: 02/14/2020/11:49:08 AM    Final        Management plans  discussed with the patient, family and they are in agreement.  CODE STATUS:  Code Status Orders  (From admission, onward)                                    Start     Ordered    02/13/20 1306  Limited resuscitation (code)  Continuous       Question Answer Comment  In the event of cardiac or respiratory ARREST: Perform CPR Yes   In the event of cardiac or respiratory ARREST: Perform Intubation/Mechanical Ventilation No   In the event of cardiac or respiratory ARREST: Perform Defibrillation or Cardioversion if indicated Yes   Antiarrhythmic drugs - Any drug used to treat arrhythmias Yes   Vasoactive drug - Any drug used to stabilize blood pressure Yes      02/13/20 1305                             Code Status History    Date Active Date Inactive Code Status Order ID Comments User Context   02/13/2020 1206 02/13/2020 1305 Full Code [id number]  Lorretta Harp, MD ED   02/13/2020 1113 02/13/2020 1206 Partial Code [id number]  Lorretta Harp, MD ED   Advance Care Planning Activity      TOTAL TIME TAKING CARE OF THIS PATIENT: 34 minutes.    Alford Highland M.D on 02/15/2020 at 3:42 PM  Between 7am to 6pm - Pager - [phone]  After 6pm go to [email or web address] - password [email or web address]  [facility]  CC: Primary care physician; Gracelyn Nurse, MD       Revision History                Note Details  Author Alford Highland, MD File Time 02/15/2020 3:50 PM  Author Type Physician Status Addendum  Last Editor Alford Highland, MD Service Internal Medicine  Hospital Acct # [id number] Admit Date 02/13/2020

## 2020-02-25 ENCOUNTER — Other Ambulatory Visit: Payer: Self-pay

## 2020-02-25 ENCOUNTER — Emergency Department: Payer: Medicare Other

## 2020-02-25 ENCOUNTER — Emergency Department
Admission: EM | Admit: 2020-02-25 | Discharge: 2020-02-25 | Disposition: A | Discharge status: Home / Self Care | Payer: Medicare Other | Attending: Emergency Medicine | Admitting: Emergency Medicine

## 2020-02-25 ENCOUNTER — Encounter: Payer: Self-pay | Admitting: Emergency Medicine

## 2020-02-25 ENCOUNTER — Ambulatory Visit: Payer: Medicare Other | Admitting: Nurse Practitioner

## 2020-02-25 DIAGNOSIS — Z7982 Long term (current) use of aspirin: Secondary | ICD-10-CM | POA: Insufficient documentation

## 2020-02-25 DIAGNOSIS — I450 Right fascicular block: Secondary | ICD-10-CM | POA: Insufficient documentation

## 2020-02-25 DIAGNOSIS — R0602 Shortness of breath: Secondary | ICD-10-CM | POA: Diagnosis present

## 2020-02-25 DIAGNOSIS — I5031 Acute diastolic (congestive) heart failure: Secondary | ICD-10-CM | POA: Insufficient documentation

## 2020-02-25 DIAGNOSIS — N1831 Chronic kidney disease, stage 3a: Secondary | ICD-10-CM | POA: Diagnosis not present

## 2020-02-25 DIAGNOSIS — F0390 Unspecified dementia without behavioral disturbance: Secondary | ICD-10-CM | POA: Insufficient documentation

## 2020-02-25 DIAGNOSIS — Z79899 Other long term (current) drug therapy: Secondary | ICD-10-CM | POA: Diagnosis not present

## 2020-02-25 DIAGNOSIS — Z7901 Long term (current) use of anticoagulants: Secondary | ICD-10-CM | POA: Diagnosis not present

## 2020-02-25 DIAGNOSIS — I130 Hypertensive heart and chronic kidney disease with heart failure and stage 1 through stage 4 chronic kidney disease, or unspecified chronic kidney disease: Secondary | ICD-10-CM | POA: Insufficient documentation

## 2020-02-25 DIAGNOSIS — I509 Heart failure, unspecified: Secondary | ICD-10-CM

## 2020-02-25 DIAGNOSIS — Z87891 Personal history of nicotine dependence: Secondary | ICD-10-CM | POA: Diagnosis not present

## 2020-02-25 DIAGNOSIS — R0600 Dyspnea, unspecified: Secondary | ICD-10-CM | POA: Diagnosis not present

## 2020-02-25 LAB — COMPREHENSIVE METABOLIC PANEL
ALT: 27 U/L (ref 0–44)
AST: 23 U/L (ref 15–41)
Albumin: 4.1 g/dL (ref 3.5–5.0)
Alkaline Phosphatase: 83 U/L (ref 38–126)
Anion gap: 13 (ref 5–15)
BUN: 27 mg/dL — ABNORMAL HIGH (ref 8–23)
CO2: 26 mmol/L (ref 22–32)
Calcium: 9.6 mg/dL (ref 8.9–10.3)
Chloride: 98 mmol/L (ref 98–111)
Creatinine, Ser: 1.61 mg/dL — ABNORMAL HIGH (ref 0.61–1.24)
GFR, Estimated: 41 mL/min — ABNORMAL LOW (ref >60)
Glucose, Bld: 126 mg/dL — ABNORMAL HIGH (ref 70–99)
Potassium: 4.3 mmol/L (ref 3.5–5.1)
Sodium: 137 mmol/L (ref 135–145)
Total Bilirubin: 3 mg/dL — ABNORMAL HIGH (ref 0.3–1.2)
Total Protein: 7.5 g/dL (ref 6.5–8.1)

## 2020-02-25 LAB — CBC WITH DIFFERENTIAL/PLATELET
Abs Immature Granulocytes: 0.05 10*3/uL (ref 0.00–0.07)
Basophils Absolute: 0.1 10*3/uL (ref 0.0–0.1)
Basophils Relative: 1 %
Eosinophils Absolute: 0.1 10*3/uL (ref 0.0–0.5)
Eosinophils Relative: 1 %
HCT: 41.1 % (ref 39.0–52.0)
Hemoglobin: 14 g/dL (ref 13.0–17.0)
Immature Granulocytes: 0 %
Lymphocytes Relative: 7 %
Lymphs Abs: 0.9 10*3/uL (ref 0.7–4.0)
MCH: 33.1 pg (ref 26.0–34.0)
MCHC: 34.1 g/dL (ref 30.0–36.0)
MCV: 97.2 fL (ref 80.0–100.0)
Monocytes Absolute: 0.8 10*3/uL (ref 0.1–1.0)
Monocytes Relative: 7 %
Neutro Abs: 9.7 10*3/uL — ABNORMAL HIGH (ref 1.7–7.7)
Neutrophils Relative %: 84 %
Platelets: 280 10*3/uL (ref 150–400)
RBC: 4.23 MIL/uL (ref 4.22–5.81)
RDW: 13.8 % (ref 11.5–15.5)
WBC: 11.6 10*3/uL — ABNORMAL HIGH (ref 4.0–10.5)
nRBC: 0 % (ref 0.0–0.2)

## 2020-02-25 LAB — TROPONIN I (HIGH SENSITIVITY): Troponin I (High Sensitivity): 89 ng/L — ABNORMAL HIGH (ref <18)

## 2020-02-25 LAB — BRAIN NATRIURETIC PEPTIDE: B Natriuretic Peptide: 1206.6 pg/mL — ABNORMAL HIGH (ref 0.0–100.0)

## 2020-02-25 NOTE — ED Notes (Addendum)
Pt ambulated around flex area. SpO2 remained at 95%. MD notified.

## 2020-02-25 NOTE — Discharge Instructions (Signed)
As we discussed please increase the patient's furosemide/Lasix from 20 mg daily to 40 mg daily for the next 5 days.  Please follow-up with your doctor on Monday for recheck/reevaluation.  If you have any further shortness of breath, develop any chest pain, or have any other symptom personally concerning to yourself please return immediately to the emergency department for recheck/reevaluation.

## 2020-02-25 NOTE — ED Notes (Signed)
Sent a rainbow to the lab. Also was sent for redraw.

## 2020-02-25 NOTE — ED Triage Notes (Signed)
Per ems brought for increased shortnessof breath.  Recent admit for a fib.  90% on RA.  4 liters and ox is 98%.  Has an iv

## 2020-02-25 NOTE — ED Provider Notes (Signed)
[facility] Emergency Department Provider Note  Time seen: 11:44 AM  I have reviewed the triage vital signs and the nursing notes.   HISTORY  Chief Complaint Shortness of Breath   HPI Robert Frye is a 86 y.o. male with a past medical history of dementia, hypertension, hyperlipidemia, recently diagnosed CHF and atrial fibrillation on Eliquis and furosemide presents to the emergency department for shortness of breath.  According to the wife the patient awoke this morning around 3 or 4:00 feeling short of breath and having difficulty catching his breath.  States patient complained of shortness of breath later today as well so she brought him to the emergency department for evaluation.  Patient denies any shortness of breath currently.  Denies any chest pain.  Patient was admitted to the hospital approximately 2 weeks ago diagnosed with atrial fibrillation and CHF, and placed on Lasix and Eliquis.  Overall patient appears well, no distress.  Past Medical History:  Diagnosis Date  . Dementia (HCC)   . Hyperlipidemia   . Hypertension   . Renal insufficiency   . Thyroid disease     Patient Active Problem List   Diagnosis Date Noted  . Acute diastolic CHF (congestive heart failure) (HCC)   . Acquired thrombophilia (HCC)   . Acute kidney injury superimposed on CKD (HCC)   . Paroxysmal atrial fibrillation (HCC) 02/13/2020  . Hypokalemia 02/13/2020  . Elevated troponin 02/13/2020  . Essential hypertension 02/13/2020  . HLD (hyperlipidemia) 02/13/2020  . CKD (chronic kidney disease), stage IIIa 02/13/2020  . Acute CHF (congestive heart failure) (HCC) 02/13/2020    History reviewed. No pertinent surgical history.  Prior to Admission medications   Medication Sig Start Date End Date Taking? Authorizing Provider  albuterol (VENTOLIN HFA) 108 (90 Base) MCG/ACT inhaler Inhale 2 puffs into the lungs every 6 (six) hours as needed for wheezing or shortness of breath.  02/15/20   Wieting, Richard, MD  amLODipine-benazepril (LOTREL) 5-10 MG capsule Take 1 capsule by mouth daily. 10/21/19 10/20/20  [provider]  apixaban (ELIQUIS) 2.5 MG TABS tablet Take 1 tablet (2.5 mg total) by mouth 2 (two) times daily. 02/15/20   Alford Highland, MD  aspirin 81 MG EC tablet Take 81 mg by mouth daily.    [provider]  atorvastatin (LIPITOR) 10 MG tablet Take 10 mg by mouth daily. 11/17/14   [provider]  Cholecalciferol 50 MCG (2000 UT) CAPS Take 2,000 Units by mouth daily.    [provider]  cyanocobalamin 1000 MCG tablet Take 1,000 mcg by mouth daily.    [provider]  furosemide (LASIX) 20 MG tablet Take 1 tablet (20 mg total) by mouth daily. 02/16/20   Alford Highland, MD  levothyroxine (SYNTHROID) 75 MCG tablet Take 75 mcg by mouth daily before breakfast. 11/12/14   [provider]  metoprolol tartrate (LOPRESSOR) 25 MG tablet Take 1 tablet (25 mg total) by mouth 2 (two) times daily. 02/15/20   Alford Highland, MD    No Known Allergies  Family History  Problem Relation Age of Onset  . Heart attack Father   . Heart disease Sister   . Heart disease Brother     Social History Social History   Tobacco Use  . Smoking status: Former Games developer  . Smokeless tobacco: Never Used  Vaping Use  . Vaping Use: Never used  Substance Use Topics  . Alcohol use: Not Currently  . Drug use: Never    Review of Systems  Constitutional: Negative for fever. Cardiovascular: Negative for chest pain. Respiratory: Shortness of breath overnight, resolved now. Gastrointestinal: Negative for abdominal pain, vomiting Genitourinary: Negative for urinary compaints Musculoskeletal: Negative for musculoskeletal complaints Skin: Negative for skin complaints  Neurological: Negative for headache All other ROS negative  ____________________________________________   PHYSICAL EXAM:  VITAL SIGNS: ED Triage Vitals  Enc  Vitals Group     BP 02/25/20 0911 136/65     Pulse Rate 02/25/20 0911 90     Resp 02/25/20 0911 16     Temp 02/25/20 0911 97.7 F (36.5 C)     Temp Source 02/25/20 0911 Oral     SpO2 02/25/20 0911 97 %     Weight 02/25/20 0912 161 lb (73 kg)     Height 02/25/20 0912 5\' 7"  (1.702 m)     Head Circumference --      Peak Flow --      Pain Score 02/25/20 0912 0     Pain Loc --      Pain Edu? --      Excl. in GC? --    Constitutional: Alert. Well appearing and in no distress. Eyes: Normal exam ENT      Head: Normocephalic and atraumatic.      Mouth/Throat: Mucous membranes are moist. Cardiovascular: Irregular rhythm rate around 80 to 90 bpm. Respiratory: Normal respiratory effort without tachypnea nor retractions. Breath sounds are clear without wheeze rales or rhonchi Gastrointestinal: Soft and nontender. No distention.   Musculoskeletal: Nontender with normal range of motion in all extremities. No lower extremity tenderness, mild edema bilaterally. Neurologic:  Normal speech and language. No gross focal neurologic deficits  Skin:  Skin is warm, dry and intact.  Psychiatric: Mood and affect are normal.   ____________________________________________    EKG  EKG viewed and interpreted by myself shows atrial fibrillation at 95 bpm with a narrow QRS, normal axis, normal intervals nonspecific ST changes.  ____________________________________________    RADIOLOGY  Chest x-ray shows cardiomegaly with edema suggesting CHF  ____________________________________________   INITIAL IMPRESSION / ASSESSMENT AND PLAN / ED COURSE  Pertinent labs & imaging results that were available during my care of the patient were reviewed by me and considered in my medical decision making (see chart for details).   Patient presents to the emergency department for shortness of breath overnight now resolved.  Overall the patient appears well satting 97 to 98% on room air in the bed.  Patient's work-up  does show an elevated BNP although largely unchanged from 2 weeks ago.  Chest x-ray shows cardiomegaly with signs of bilateral edema.  I have personally reviewed the chest x-ray images appear to show mild edema.  Patient was discharged approximately 10 days ago on Lasix which she has been taking at home 20 mg daily.  Wife states they have been taking daily weights and he has remained at 161 since discharge.  We ambulated the patient around the emergency department including an entire lap around the flex department and the patient ambulated very well and quickly without assistance and never desatted below 95%.  However given the patient's age and comorbidities I did offer admission to the hospital for IV diuresis as well as the option to double the Lasix for the next 5 days at home and continue monitoring.  Patient strongly wishes to go home, wife believes is reasonable to try going home as well.  We will double the Lasix for the next 5 days to 40 mg daily.  I  discussed with the wife and patient if there is no improvement he is to return to the emergency department for reevaluation.  LY BACCHI was evaluated in Emergency Department on 02/25/2020 for the symptoms described in the history of present illness. He was evaluated in the context of the global COVID-19 pandemic, which necessitated consideration that the patient might be at risk for infection with the SARS-CoV-2 virus that causes COVID-19. Institutional protocols and algorithms that pertain to the evaluation of patients at risk for COVID-19 are in a state of rapid change based on information released by regulatory bodies including the CDC and federal and state organizations. These policies and algorithms were followed during the patient's care in the ED.  ____________________________________________   FINAL CLINICAL IMPRESSION(S) / ED DIAGNOSES  CHF exacerbation Dyspnea   Minna Antis, MD 02/25/20 1154

## 2020-02-25 NOTE — ED Triage Notes (Signed)
Pt to ED via [facility] from home for shortness of breath. Pt wife reports that pt has hx/o A. Fib and CHF. Pt was DC from hospital on 11/23. Pt wife states that pt woke up this morning and was very short of breath. Pt wife called EMS. Pt was on 4 liters of O2 when he arrived. Pt O2 titrated down to 2 liter via [place] by this RN with sats remaining at 98-100%. Pt is in [place] at this time. Pt wife is with pt as he has hx/o dementia and is unable to answer questions.

## 2020-02-26 NOTE — Progress Notes (Signed)
Patient ID: Robert Frye, male    DOB: 05-17-1931, 86 y.o.   MRN: [id number]  HPI  Robert Frye is a 86 y/o male with a history of paroxysmal atrial fibrillation, hyperlipidemia, HTN, CKD, thyroid disease, dementia, previous tobacco use and chronic heart failure.   Echo report from 02/14/20 reviewed and showed an EF of 55-60% along with severe LVH, severe LAE and mild Robert.   Was in the ED 02/25/20 due to shortness of breath. CXR shows cardiomegaly. Offered IV lasix but patient prefers to go home with diuretic doubled. Admitted 02/13/20 due to acute on chronic HF. Cardiology consult obtained. Initially given IV lasix with transition to oral diuretics. Eliquis started due to new onset atrial fibrillation. Elevated troponin thought to be due to demand ischemia. Hypokalemia corrected. Discharged after 2 days.   Patient presents for his initial visit with a chief complaint of minimal shortness of breath upon moderate exertion. He's had this for awhile but is unable to quantify how long he's felt short of breath. Does feel like he's improved since his recent ED visit. He has associated light-headedness in the mornings and hearing loss along with this. He denies any difficulty sleeping, abdominal distention, palpitations, pedal edema, chest pain, wheezing, cough, fatigue or weight gain.   Has had furosemide doubled since recent ED visit and after tomorrow, he resumes taking 1 tablet daily instead of 2.   Past Medical History:  Diagnosis Date  . Arrhythmia    paroxysmal atrial fibrillation  . CHF (congestive heart failure) (HCC)   . Dementia (HCC)   . Hyperlipidemia   . Hypertension   . Renal insufficiency   . Thyroid disease    No past surgical history on file. Family History  Problem Relation Age of Onset  . Heart attack Father   . Heart disease Sister   . Heart disease Brother    Social History   Tobacco Use  . Smoking status: Former Games developer  . Smokeless tobacco: Never Used  Substance  Use Topics  . Alcohol use: Not Currently   No Known Allergies Prior to Admission medications   Medication Sig Start Date End Date Taking? Authorizing Provider  albuterol (VENTOLIN HFA) 108 (90 Base) MCG/ACT inhaler Inhale 2 puffs into the lungs every 6 (six) hours as needed for wheezing or shortness of breath. 02/15/20  Yes Wieting, Richard, MD  amLODipine-benazepril (LOTREL) 5-10 MG capsule Take 1 capsule by mouth daily. 10/21/19 10/20/20 Yes [provider]  apixaban (ELIQUIS) 2.5 MG TABS tablet Take 1 tablet (2.5 mg total) by mouth 2 (two) times daily. 02/15/20  Yes Alford Highland, MD  aspirin 81 MG EC tablet Take 81 mg by mouth daily.   Yes [provider]  atorvastatin (LIPITOR) 10 MG tablet Take 10 mg by mouth daily. 11/17/14  Yes [provider]  Cholecalciferol 50 MCG (2000 UT) CAPS Take 2,000 Units by mouth daily.   Yes [provider]  cyanocobalamin 1000 MCG tablet Take 1,000 mcg by mouth daily.   Yes [provider]  furosemide (LASIX) 20 MG tablet Take 1 tablet (20 mg total) by mouth daily. Patient taking differently: Take 20 mg by mouth 2 (two) times daily.  02/16/20  Yes Alford Highland, MD  levothyroxine (SYNTHROID) 75 MCG tablet Take 75 mcg by mouth daily before breakfast. 11/12/14  Yes [provider]  metoprolol tartrate (LOPRESSOR) 25 MG tablet Take 1 tablet (25 mg total) by mouth 2 (two) times daily. 02/15/20  Yes Alford Highland, MD  potassium chloride SA (KLOR-CON) 20 MEQ tablet Take 20 mEq by mouth daily.   Yes [provider]    Review of Systems  Constitutional: Positive for appetite change (decreased). Negative for fatigue.  HENT: Positive for hearing loss. Negative for congestion, rhinorrhea and sore throat.   Eyes: Negative.   Respiratory: Positive for shortness of breath. Negative for cough, chest tightness and wheezing.   Cardiovascular: Negative for chest pain, palpitations and leg swelling.   Gastrointestinal: Negative for abdominal distention and abdominal pain.  Endocrine: Negative.   Genitourinary: Negative.   Musculoskeletal: Negative for back pain and neck pain.  Skin: Negative.   Allergic/Immunologic: Negative.   Neurological: Positive for light-headedness. Negative for dizziness and headaches.  Hematological: Negative for adenopathy. Does not bruise/bleed easily.  Psychiatric/Behavioral: Negative for dysphoric mood and sleep disturbance. The patient is not nervous/anxious.    Vitals:   02/28/20 1311  BP: (!) 89/66  Pulse: 83  Resp: 18  SpO2: 99%  Weight: 163 lb (73.9 kg)  Height: 5\' 7"  (1.702 m)   Wt Readings from Last 3 Encounters:  02/28/20 163 lb (73.9 kg)  02/25/20 161 lb (73 kg)  02/15/20 161 lb 3.2 oz (73.1 kg)   Lab Results  Component Value Date   CREATININE 1.61 (H) 02/25/2020   CREATININE 1.58 (H) 02/15/2020   CREATININE 1.40 (H) 02/14/2020    Physical Exam Vitals and nursing note reviewed. Exam conducted with a chaperone present (wife is present).  Constitutional:      Appearance: Normal appearance.  HENT:     Head: Normocephalic and atraumatic.     Right Ear: Decreased hearing noted.     Left Ear: Decreased hearing noted.  Cardiovascular:     Rate and Rhythm: Normal rate and regular rhythm.  Pulmonary:     Effort: Pulmonary effort is normal. No respiratory distress.     Breath sounds: No wheezing or rales.  Abdominal:     General: There is no distension.     Palpations: Abdomen is soft.     Tenderness: There is no abdominal tenderness.  Musculoskeletal:        General: No tenderness.     Cervical back: Normal range of motion and neck supple.     Right lower leg: Edema (above sock line) present.     Left lower leg: No edema.  Skin:    General: Skin is warm and dry.  Neurological:     General: No focal deficit present.     Mental Status: He is alert. Mental status is at baseline.  Psychiatric:        Mood and Affect: Mood  normal.        Behavior: Behavior normal.     Assessment & Plan:  1: Chronic heart failure with preserved ejection fraction with structural changes (LVH/ LAE)- - NYHA class II - euvolemic today - weighing daily and wife says that his weight has been stable; instructed to call for an overnight weight gain of > 2 pounds or a weekly weight gain of >5 pounds - not adding any additional salt to his food - supposed to [facility] cardiology 03/07/20 - current BP will not allow for addition of entresto - BNP 02/25/20 was 1206.6  2: HTN- - BP on the low side today, most likely related to additional furosemide doses that he was prescribed - wife says that she can check his BP at home; if it remains low, may need to stop his lotrel - saw PCP (  [place]) 02/21/20  - BMP 02/25/20 reviewed and showed sodium 137, potassium 4.3, creatinine 1.61 and GFR 41  3: Paroxysmal atrial fibrillation- - currently rate controlled - on apixaban and metoprolol tartrate   Medication bottles were reviewed.   Return in 2 months or sooner for any questions/problems before then.

## 2020-02-28 ENCOUNTER — Encounter: Payer: Self-pay | Admitting: Family

## 2020-02-28 ENCOUNTER — Ambulatory Visit: Payer: Medicare Other | Attending: Family | Admitting: Family

## 2020-02-28 ENCOUNTER — Other Ambulatory Visit: Payer: Self-pay

## 2020-02-28 VITALS — BP 89/66 | HR 83 | Resp 18 | Ht 67.0 in | Wt 163.0 lb

## 2020-02-28 DIAGNOSIS — R42 Dizziness and giddiness: Secondary | ICD-10-CM | POA: Insufficient documentation

## 2020-02-28 DIAGNOSIS — I5032 Chronic diastolic (congestive) heart failure: Secondary | ICD-10-CM | POA: Diagnosis not present

## 2020-02-28 DIAGNOSIS — I130 Hypertensive heart and chronic kidney disease with heart failure and stage 1 through stage 4 chronic kidney disease, or unspecified chronic kidney disease: Secondary | ICD-10-CM | POA: Insufficient documentation

## 2020-02-28 DIAGNOSIS — E785 Hyperlipidemia, unspecified: Secondary | ICD-10-CM | POA: Diagnosis not present

## 2020-02-28 DIAGNOSIS — F0390 Unspecified dementia without behavioral disturbance: Secondary | ICD-10-CM | POA: Insufficient documentation

## 2020-02-28 DIAGNOSIS — N189 Chronic kidney disease, unspecified: Secondary | ICD-10-CM | POA: Diagnosis not present

## 2020-02-28 DIAGNOSIS — Z7982 Long term (current) use of aspirin: Secondary | ICD-10-CM | POA: Diagnosis not present

## 2020-02-28 DIAGNOSIS — Z7901 Long term (current) use of anticoagulants: Secondary | ICD-10-CM | POA: Diagnosis not present

## 2020-02-28 DIAGNOSIS — I480 Paroxysmal atrial fibrillation: Secondary | ICD-10-CM | POA: Insufficient documentation

## 2020-02-28 DIAGNOSIS — Z8249 Family history of ischemic heart disease and other diseases of the circulatory system: Secondary | ICD-10-CM | POA: Insufficient documentation

## 2020-02-28 DIAGNOSIS — R0602 Shortness of breath: Secondary | ICD-10-CM | POA: Diagnosis present

## 2020-02-28 DIAGNOSIS — E079 Disorder of thyroid, unspecified: Secondary | ICD-10-CM | POA: Insufficient documentation

## 2020-02-28 DIAGNOSIS — Z79899 Other long term (current) drug therapy: Secondary | ICD-10-CM | POA: Diagnosis not present

## 2020-02-28 DIAGNOSIS — Z87891 Personal history of nicotine dependence: Secondary | ICD-10-CM | POA: Insufficient documentation

## 2020-02-28 DIAGNOSIS — I10 Essential (primary) hypertension: Secondary | ICD-10-CM

## 2020-02-28 NOTE — Patient Instructions (Signed)
Continue weighing daily and call for an overnight weight gain of > 2 pounds or a weekly weight gain of >5 pounds. 

## 2020-03-07 ENCOUNTER — Other Ambulatory Visit: Payer: Self-pay

## 2020-03-07 ENCOUNTER — Encounter: Payer: Self-pay | Admitting: Physician Assistant

## 2020-03-07 ENCOUNTER — Ambulatory Visit: Payer: Medicare Other | Admitting: Physician Assistant

## 2020-03-07 ENCOUNTER — Ambulatory Visit (INDEPENDENT_AMBULATORY_CARE_PROVIDER_SITE_OTHER): Payer: Medicare Other | Admitting: Physician Assistant

## 2020-03-07 ENCOUNTER — Other Ambulatory Visit: Payer: Self-pay | Admitting: Physician Assistant

## 2020-03-07 VITALS — BP 110/60 | HR 64 | Ht 67.0 in | Wt 160.1 lb

## 2020-03-07 DIAGNOSIS — I480 Paroxysmal atrial fibrillation: Secondary | ICD-10-CM | POA: Diagnosis not present

## 2020-03-07 DIAGNOSIS — I5032 Chronic diastolic (congestive) heart failure: Secondary | ICD-10-CM | POA: Diagnosis not present

## 2020-03-07 DIAGNOSIS — N1831 Chronic kidney disease, stage 3a: Secondary | ICD-10-CM | POA: Diagnosis not present

## 2020-03-07 DIAGNOSIS — I4819 Other persistent atrial fibrillation: Secondary | ICD-10-CM | POA: Diagnosis not present

## 2020-03-07 DIAGNOSIS — I10 Essential (primary) hypertension: Secondary | ICD-10-CM | POA: Diagnosis not present

## 2020-03-07 DIAGNOSIS — Z8639 Personal history of other endocrine, nutritional and metabolic disease: Secondary | ICD-10-CM

## 2020-03-07 DIAGNOSIS — E785 Hyperlipidemia, unspecified: Secondary | ICD-10-CM

## 2020-03-07 MED ORDER — METOPROLOL TARTRATE 25 MG PO TABS: 25.0000 mg | ORAL_TABLET | Freq: Two times a day (BID) | ORAL | 3 refills | Status: DC

## 2020-03-07 MED ORDER — APIXABAN 2.5 MG PO TABS: 2.5000 mg | ORAL_TABLET | Freq: Two times a day (BID) | ORAL | 3 refills | Status: DC

## 2020-03-07 MED ORDER — FUROSEMIDE 20 MG PO TABS: 20.0000 mg | ORAL_TABLET | Freq: Every day | ORAL | 3 refills | Status: DC

## 2020-03-07 MED ORDER — FUROSEMIDE 20 MG PO TABS: 20.0000 mg | ORAL_TABLET | Freq: Every day | ORAL | 0 refills | Status: DC

## 2020-03-07 NOTE — Patient Instructions (Signed)
Medication Instructions:   Your physician recommends that you continue on your current medications as directed. Please refer to the Current Medication list given to you today.  *If you need a refill on your cardiac medications before your next appointment, please call your pharmacy*   Lab Work:  Your physician recommends that you have lab work TODAY: Cmet, CBC  If you have labs (blood work) drawn today and your tests are completely normal, you will receive your results only by: Marland Kitchen MyChart Message (if you have MyChart) OR . A paper copy in the mail If you have any lab test that is abnormal or we need to change your treatment, we will call you to review the results.   Testing/Procedures:  You are scheduled for a Cardioversion on 03/14/20 with Dr. Kirke Corin. Please arrive at the [facility] at 7:00 a.m. on the day of your procedure.  DIET INSTRUCTIONS:  Nothing to eat or drink after midnight except your medications with a sip of water.         1) Labs: CBC and CMET were completed today in office.   2) Medications:  Do NOT take your Furosemide (Lasix) the morning of procedure.   YOU MAY TAKE ALL of your remaining medications with a small amount of water.  Please make sure you TAKE your Eliquis.  3) Must have a responsible person to drive you home.  4) Bring a current list of your medications and current insurance cards.    If you have any questions after you get home, please call the office at [phone]  COVID PRE- TEST: You will need a COVID TEST prior to the procedure:  LOCATION: [facility]ru Testing site.  DATE/TIME:  Friday 03/10/20 between 8AM - 1PM     Follow-Up: At [facility], you and your health needs are our priority.  As part of our continuing mission to provide you with exceptional heart care, we have created designated Provider Care Teams.  These Care Teams include your primary Cardiologist (physician) and Advanced Practice  Providers (APPs -  Physician Assistants and Nurse Practitioners) who all work together to provide you with the care you need, when you need it.  We recommend signing up for the patient portal called "MyChart".  Sign up information is provided on this After Visit Summary.  MyChart is used to connect with patients for Virtual Visits (Telemedicine).  Patients are able to view lab/test results, encounter notes, upcoming appointments, etc.  Non-urgent messages can be sent to your provider as well.   To learn more about what you can do with MyChart, go to [email or web address].    Your next appointment:    Follow up after Cardioversion  The format for your next appointment:   In Person  Provider:   You may see [name], MD or one of the following Advanced Practice Providers on your designated Care Team:    Nicolasa Ducking, NP  Eula Listen, PA-C  Marisue Ivan, PA-C  Cadence Onamia, [place]  Gillian Shields, NP

## 2020-03-07 NOTE — Progress Notes (Signed)
Office Visit    Patient Name: Robert Frye Date of Encounter: 03/07/2020  Primary Care Provider:  Baxter Hire, MD Primary Cardiologist:  Kathlyn Sacramento, MD  Chief Complaint    Chief Complaint  Patient presents with  . Follow-up    [facility]; A-Fib/CHF/shortness of breath.  C/o fatigue in the am.     86 year old male with history of hypertension, hyperlipidemia, CKD 3, mild dementia, and evaluated today for hospital follow-up.  Past Medical History    Past Medical History:  Diagnosis Date  . Arrhythmia    paroxysmal atrial fibrillation  . CHF (congestive heart failure) ([place])   . Dementia ([place])   . Hyperlipidemia   . Hypertension   . Renal insufficiency   . Thyroid disease    No past surgical history on file.  Allergies  No Known Allergies  History of Present Illness    Robert Frye is a 86 y.o. male with PMH as above.  He presented to the ED 11/21 with 1 month of cough and new onset shortness of breath.  His wife had noted he was more short of breath within the last few days.  He had exertional dyspnea and noted abdominal distention.  Cough was nonproductive.  He ate out frequently and was not weighing himself regularly.    He was admitted to [facility] and cardiology consulted 02/13/2020.  He was noted to be in atrial fibrillation with ventricular rates 90s to 110s and volume overloaded with BNP 1073.  High-sensitivity troponin was elevated and flat trending, peaking at 125, and thought 2/2 supply demand ischemia in the setting of exacerbation of diastolic heart failure with eccentric LVH, A. fib with RVR, and AKI. 02/14/2020 echo showed EF 55 to 60%, NRWMA, severe eccentric left ventricular hypertrophy of the apical and basal septal segments, G1 DD, mild RVE, severe LAE, moderate RAE, mild MR. Findings were noted to be consistent with hypertrophic obstructive cardiomyopathy on echo report.  He was IV diuresed with slight bump in renal function, improved after holding IV  Lasix.  Potassium was repleted.  He was discharged on oral Lasix 20 mg daily.  On review of EMR, future considerations included possible outpatient cardiac MRI after cardioversion.  He was continued on Lopressor for rate control.  He was continued on reduced dose Eliquis 2.5 mg twice daily, given he met both age and renal function criteria.  It was noted that, if he remained in atrial fibrillation at RTC, cardioversion could be considered if therapeutically anticoagulated at that time.  He was seen in the emergency department 02/25/2020 with report of shortness of breath overnight.  Oxygen saturation 97 to 98%.  BNP elevated.  Chest x-ray showed bilateral edema.  Weight 161 pounds with BP 136/65.  Ventricular rate 90 bpm.  Admission with IV diuresis was offered to the patient with preference to go home and increase oral Lasix to 20 mg twice daily for the next 5 days with close outpatient follow-up.   He was seen by Darylene Price 02/28/2020 for follow-up 3 days later with weight 163 pounds, BP 89/66, ventricular rate 83 bpm.  Physical exam noted bilateral edema of the lower extremities.  He reported some shortness of breath with moderate exertion at the time.  He also reported some lightheadedness in the mornings, as well as decreased appetite hearing loss.  He reported that he had been taking his Lasix 20 mg twice daily since discharge from the ED and would resume taking Lasix 20 mg daily  starting the next day (02/29/2020).  Today, 03/07/2020, he returns to clinic and notes ongoing improvement in shortness of breath.  No chest pain, racing heart rate, or palpitations.  No presyncope or syncope.  He has only used his albuterol for breathing twice and, per his wife, they do not expect to use the albuterol again anytime soon given symptom improvement.  His wife states that he has been sleeping on 2 pillows; however, he has not tried to decrease down to 1 pillow and will do so after his visit today to see if he feels  short of breath.  He does not feel short of breath on 2 pillows.  He denies any PND, abdominal distention, lower extremity edema, or early satiety.  Lower extremity edema was noted on exam with patient report that it does not bother him and he had not noted it prior to this visit.  He elevates his legs at night with improvement in edema.  He is weighing himself at home with dry weight 159-160 on home scale.  He reports adequate urine output on his Lasix 20 mg daily.  His wife noted that he needs a refill for Lasix, as he is 5 pills short for the month, and which will be provided today.  He denies any signs or symptoms of bleeding on Eliquis 2.5 mg twice daily.  He has not missed any doses.  He remains in atrial fibrillation today with cardioversion discussed and patient agreeable to proceed with procedure.    Home Medications    Outpatient Encounter Medications as of 03/07/2020  Medication Sig Note  . albuterol (VENTOLIN HFA) 108 (90 Base) MCG/ACT inhaler Inhale 2 puffs into the lungs every 6 (six) hours as needed for wheezing or shortness of breath.   Marland Kitchen amLODipine-benazepril (LOTREL) 5-10 MG capsule Take 1 capsule by mouth daily.   Marland Kitchen aspirin 81 MG EC tablet Take 81 mg by mouth daily.   Marland Kitchen atorvastatin (LIPITOR) 10 MG tablet Take 10 mg by mouth daily.   . Cholecalciferol 50 MCG (2000 UT) CAPS Take 2,000 Units by mouth daily.   . cyanocobalamin 1000 MCG tablet Take 1,000 mcg by mouth daily. 02/13/2020: Patient only takes 2-3 times weekly  . levothyroxine (SYNTHROID) 75 MCG tablet Take 75 mcg by mouth daily before breakfast.   . potassium chloride SA (KLOR-CON) 20 MEQ tablet Take 20 mEq by mouth daily.   . [DISCONTINUED] apixaban (ELIQUIS) 2.5 MG TABS tablet Take 1 tablet (2.5 mg total) by mouth 2 (two) times daily.   . [DISCONTINUED] furosemide (LASIX) 20 MG tablet Take 1 tablet (20 mg total) by mouth daily.   . [DISCONTINUED] metoprolol tartrate (LOPRESSOR) 25 MG tablet Take 1 tablet (25 mg total) by  mouth 2 (two) times daily.   Marland Kitchen apixaban (ELIQUIS) 2.5 MG TABS tablet Take 1 tablet (2.5 mg total) by mouth 2 (two) times daily.   . furosemide (LASIX) 20 MG tablet Take 1 tablet (20 mg total) by mouth daily for 10 days.   . metoprolol tartrate (LOPRESSOR) 25 MG tablet Take 1 tablet (25 mg total) by mouth 2 (two) times daily.   . [DISCONTINUED] furosemide (LASIX) 20 MG tablet Take 1 tablet (20 mg total) by mouth daily.    No facility-administered encounter medications on file as of 03/07/2020.   Review of Systems    He denies chest pain, palpitations, dyspnea, pnd, orthopnea, n, v, dizziness, syncope, edema, weight gain, or early satiety.  He has not noticed the lower extremity edema present  on exam today.  He reports fatigue in the mornings.   All other systems reviewed and are otherwise negative except as noted above.  Physical Exam    VS:  BP 110/60 (BP Location: Left Arm, Patient Position: Sitting, Cuff Size: Normal)   Pulse 64   Ht _0  (1.702 m)   Wt 160 lb 2 oz (72.6 kg)   SpO2 96%   BMI 25.08 kg/m  , BMI Body mass index is 25.08 kg/m. GEN: Elderly gentleman, in no acute distress.  Joined by his wife. HEENT: normal. Neck: Supple, no JVD, carotid bruits, or masses. Cardiac: IRIR with controlled ventricular rate. no murmurs, rubs, or gallops. No clubbing, cyanosis.  Moderate to 1+ bilateral edema present and worse on the right lower extremity.  Radials/DP/PT 2+ and equal bilaterally.  Respiratory:  Respirations regular and unlabored,clear to auscultation bilaterally. GI: Soft, nontender, nondistended, BS + x 4. MS: no deformity or atrophy. Skin: warm and dry, no rash. Neuro:  Strength and sensation are intact. Psych: Normal affect.  Accessory Clinical Findings    ECG personally reviewed by me today -atrial fibrillation with ventricular rate 64 bpm, inferior lateral ischemia as noted on prior tracings- no acute changes.  VITALS Reviewed today   Temp Readings from Last 3  Encounters:  02/25/20 97.6 F (36.4 C) (Oral)  02/15/20 98.3 F (36.8 C) (Oral)   BP Readings from Last 3 Encounters:  03/07/20 110/60  02/28/20 (!) 89/66  02/25/20 (!) 150/89   Pulse Readings from Last 3 Encounters:  03/07/20 64  02/28/20 83  02/25/20 85    Wt Readings from Last 3 Encounters:  03/07/20 160 lb 2 oz (72.6 kg)  02/28/20 163 lb (73.9 kg)  02/25/20 161 lb (73 kg)     LABS  reviewed today   Lab Results  Component Value Date   WBC 11.6 (H) 02/25/2020   HGB 14.0 02/25/2020   HCT 41.1 02/25/2020   MCV 97.2 02/25/2020   PLT 280 02/25/2020   Lab Results  Component Value Date   CREATININE 1.61 (H) 02/25/2020   BUN 27 (H) 02/25/2020   NA 137 02/25/2020   K 4.3 02/25/2020   CL 98 02/25/2020   CO2 26 02/25/2020   Lab Results  Component Value Date   ALT 27 02/25/2020   AST 23 02/25/2020   ALKPHOS 83 02/25/2020   BILITOT 3.0 (H) 02/25/2020   Lab Results  Component Value Date   CHOL 139 02/14/2020   HDL 42 02/14/2020   LDLCALC 85 02/14/2020   TRIG 60 02/14/2020   CHOLHDL 3.3 02/14/2020    Lab Results  Component Value Date   HGBA1C 5.7 (H) 02/14/2020   Lab Results  Component Value Date   TSH 2.687 02/14/2020     STUDIES/PROCEDURES reviewed today   Echo 02/14/20 1. Left ventricular ejection fraction, by estimation, is 55 to 60%. The  left ventricle has normal function. The left ventricle has no regional  wall motion abnormalities. There is severe eccentric left ventricular  hypertrophy of the apical and  basal-septal segments. Left ventricular diastolic parameters are  consistent with Grade I diastolic dysfunction (impaired relaxation).  2. Right ventricular systolic function is normal. The right ventricular  size is mildly enlarged.  3. Left atrial size was severely dilated.  4. Right atrial size was moderately dilated.  5. The mitral valve is normal in structure. Mild mitral valve  regurgitation. No evidence of mitral stenosis.   6. The aortic valve is normal in  structure. Aortic valve regurgitation is  not visualized. No aortic stenosis is present.  7. The inferior vena cava is normal in size with greater than 50%  respiratory variability, suggesting right atrial pressure of 3 mmHg.  Conclusion(s)/Recommendation(s): Findings consistent with hypertrophic  obstructive cardiomyopathy.   Assessment & Plan    Chronic diastolic CHF --Reports improvement/resolution in shortness of breath.  Recent ED visit 02/25/20 for repeat exacerbation of volume status and subsequent increased lasix 36m x 5 days (12/3-12/8). Lower extremity edema noted on exam, also reported by TDarylene Price NP.  Otherwise euvolemic on exam with wt decreased from that of his HF visit on 12/6. BP and ventricular rate well controlled. TWI noted on EKG but consistent with prior tracings on review of EKGs. Most recent EF nl, G1DD, severe eccentric LVH and noted as consistent with HOCM. Consider that Afib is exacerbating his volume status. During his 11/21 admission, it was noted that DCCV should be performed if still in Afib at RTC, and following DCCV, consideration of cMRI given LVH and echo findings.   Continue lasix 226mdaily.    Recheck BMET s/p increased diuresis.  Recheck CBC/albumin given LEE / fatigue.   Reviewed s/sx of volume overload. Encouraged ongoing daily wt, BP monitoring.  Home wt 159-160lbs. Reviewed recommendations for fluid intake under 2 L and salt intake under 2 g daily. Discussed compression stockings and encouraged ongoing leg elevation for LEE.    After DCCV as outlined below, consider cMRI given concentric LVH on echo out of proportion to HTN and to rule out infiltrative heart dz, such as amyloidosis.   [place]istent [place]Denies racing heart rate or palpitations.  Still in Afib today on EKG. Notes fatigue, especially in the morning.  Ventricular rates well controlled on current BB.  Discussed avoiding  Albuterol, given this can exacerbate his ventricular rates. Complaint with OAFlowery Branchnd denies any missed doses. CHA2DS2-VASc score of at least 4 (CHF, hypertension, age x2) with recommendation for indefinite anticoagulation to reduce the risk of thromboembolic event.  Discharged on reduced dose Eliquis 2.30m29mID (age and renal function). He will be therapeutic after 03/14/20, at which time DCCV can be performed. On review of echo, severe left atrial enlargement noted, which may influence his ability to hold NSR. Reassess following DCCV to ensure maintaining NSR.  Continue reduced dose Eliquis 2.30mg14mD.   Continue Lopressor 230mg64m.  BMET obtained to ensure stable renal function and electrolytes, as well as that still meets criteria for reduced dose Eliquis.   CBC obtained , given start of OAC. [place] outpatient DCCV with Dr. AridaFletcher Anonr 03/14/2020.  The risks (stroke, cardiac arrhythmias rarely resulting in the need for a temporary or permanent pacemaker, skin irritation or burns and complications associated with conscious sedation including aspiration, arrhythmia, respiratory failure and death), benefits (restoration of normal sinus rhythm) and alternatives of a direct current cardioversion were explained in detail to Mr. JohnsBollighe agrees to proceed.   HTN --Blood pressure currently well controlled with recent hypotension noted after increased oral diuresis. Check BMET.  Continue current medications.  CKD stage III --Recheck BMET / renal function and electrolytes after increased oral diuresis as outlined above.  Further recommendations, if indicated, at that time.  History of hypokalemia --Check BMET as above.  HLD --Continue Lipitor.  Medication changes: None (will check BMET to ensure meets OAC rGunnisonced dosing criteria, however) Labs ordered: CMET, CBC Studies / Imaging ordered: DCCV Future considerations: Cardiac MRI, if  recommended after DCCV Disposition: RTC after  cardioversion    Arvil Chaco, PA-C 03/07/2020

## 2020-03-07 NOTE — H&P (View-Only) (Signed)
Office Visit    Patient Name: Robert Frye Date of Encounter: 03/07/2020  Primary Care Provider:  Baxter Hire, MD Primary Cardiologist:  Kathlyn Sacramento, MD  Chief Complaint    Chief Complaint  Patient presents with  . Follow-up    [facility]; A-Fib/CHF/shortness of breath.  C/o fatigue in the am.     86 year old male with history of hypertension, hyperlipidemia, CKD 3, mild dementia, and evaluated today for hospital follow-up.  Past Medical History    Past Medical History:  Diagnosis Date  . Arrhythmia    paroxysmal atrial fibrillation  . CHF (congestive heart failure) ([place])   . Dementia ([place])   . Hyperlipidemia   . Hypertension   . Renal insufficiency   . Thyroid disease    No past surgical history on file.  Allergies  No Known Allergies  History of Present Illness    Robert Frye is a 86 y.o. male with PMH as above.  He presented to the ED 11/21 with 1 month of cough and new onset shortness of breath.  His wife had noted he was more short of breath within the last few days.  He had exertional dyspnea and noted abdominal distention.  Cough was nonproductive.  He ate out frequently and was not weighing himself regularly.    He was admitted to [facility] and cardiology consulted 02/13/2020.  He was noted to be in atrial fibrillation with ventricular rates 90s to 110s and volume overloaded with BNP 1073.  High-sensitivity troponin was elevated and flat trending, peaking at 125, and thought 2/2 supply demand ischemia in the setting of exacerbation of diastolic heart failure with eccentric LVH, A. fib with RVR, and AKI. 02/14/2020 echo showed EF 55 to 60%, NRWMA, severe eccentric left ventricular hypertrophy of the apical and basal septal segments, G1 DD, mild RVE, severe LAE, moderate RAE, mild MR. Findings were noted to be consistent with hypertrophic obstructive cardiomyopathy on echo report.  He was IV diuresed with slight bump in renal function, improved after holding IV  Lasix.  Potassium was repleted.  He was discharged on oral Lasix 20 mg daily.  On review of EMR, future considerations included possible outpatient cardiac MRI after cardioversion.  He was continued on Lopressor for rate control.  He was continued on reduced dose Eliquis 2.5 mg twice daily, given he met both age and renal function criteria.  It was noted that, if he remained in atrial fibrillation at RTC, cardioversion could be considered if therapeutically anticoagulated at that time.  He was seen in the emergency department 02/25/2020 with report of shortness of breath overnight.  Oxygen saturation 97 to 98%.  BNP elevated.  Chest x-ray showed bilateral edema.  Weight 161 pounds with BP 136/65.  Ventricular rate 90 bpm.  Admission with IV diuresis was offered to the patient with preference to go home and increase oral Lasix to 20 mg twice daily for the next 5 days with close outpatient follow-up.   He was seen by Darylene Price 02/28/2020 for follow-up 3 days later with weight 163 pounds, BP 89/66, ventricular rate 83 bpm.  Physical exam noted bilateral edema of the lower extremities.  He reported some shortness of breath with moderate exertion at the time.  He also reported some lightheadedness in the mornings, as well as decreased appetite hearing loss.  He reported that he had been taking his Lasix 20 mg twice daily since discharge from the ED and would resume taking Lasix 20 mg daily  starting the next day (02/29/2020).  Today, 03/07/2020, he returns to clinic and notes ongoing improvement in shortness of breath.  No chest pain, racing heart rate, or palpitations.  No presyncope or syncope.  He has only used his albuterol for breathing twice and, per his wife, they do not expect to use the albuterol again anytime soon given symptom improvement.  His wife states that he has been sleeping on 2 pillows; however, he has not tried to decrease down to 1 pillow and will do so after his visit today to see if he feels  short of breath.  He does not feel short of breath on 2 pillows.  He denies any PND, abdominal distention, lower extremity edema, or early satiety.  Lower extremity edema was noted on exam with patient report that it does not bother him and he had not noted it prior to this visit.  He elevates his legs at night with improvement in edema.  He is weighing himself at home with dry weight 159-160 on home scale.  He reports adequate urine output on his Lasix 20 mg daily.  His wife noted that he needs a refill for Lasix, as he is 5 pills short for the month, and which will be provided today.  He denies any signs or symptoms of bleeding on Eliquis 2.5 mg twice daily.  He has not missed any doses.  He remains in atrial fibrillation today with cardioversion discussed and patient agreeable to proceed with procedure.    Home Medications    Outpatient Encounter Medications as of 03/07/2020  Medication Sig Note  . albuterol (VENTOLIN HFA) 108 (90 Base) MCG/ACT inhaler Inhale 2 puffs into the lungs every 6 (six) hours as needed for wheezing or shortness of breath.   Marland Kitchen amLODipine-benazepril (LOTREL) 5-10 MG capsule Take 1 capsule by mouth daily.   Marland Kitchen aspirin 81 MG EC tablet Take 81 mg by mouth daily.   Marland Kitchen atorvastatin (LIPITOR) 10 MG tablet Take 10 mg by mouth daily.   . Cholecalciferol 50 MCG (2000 UT) CAPS Take 2,000 Units by mouth daily.   . cyanocobalamin 1000 MCG tablet Take 1,000 mcg by mouth daily. 02/13/2020: Patient only takes 2-3 times weekly  . levothyroxine (SYNTHROID) 75 MCG tablet Take 75 mcg by mouth daily before breakfast.   . potassium chloride SA (KLOR-CON) 20 MEQ tablet Take 20 mEq by mouth daily.   . [DISCONTINUED] apixaban (ELIQUIS) 2.5 MG TABS tablet Take 1 tablet (2.5 mg total) by mouth 2 (two) times daily.   . [DISCONTINUED] furosemide (LASIX) 20 MG tablet Take 1 tablet (20 mg total) by mouth daily.   . [DISCONTINUED] metoprolol tartrate (LOPRESSOR) 25 MG tablet Take 1 tablet (25 mg total) by  mouth 2 (two) times daily.   Marland Kitchen apixaban (ELIQUIS) 2.5 MG TABS tablet Take 1 tablet (2.5 mg total) by mouth 2 (two) times daily.   . furosemide (LASIX) 20 MG tablet Take 1 tablet (20 mg total) by mouth daily for 10 days.   . metoprolol tartrate (LOPRESSOR) 25 MG tablet Take 1 tablet (25 mg total) by mouth 2 (two) times daily.   . [DISCONTINUED] furosemide (LASIX) 20 MG tablet Take 1 tablet (20 mg total) by mouth daily.    No facility-administered encounter medications on file as of 03/07/2020.   Review of Systems    He denies chest pain, palpitations, dyspnea, pnd, orthopnea, n, v, dizziness, syncope, edema, weight gain, or early satiety.  He has not noticed the lower extremity edema present  on exam today.  He reports fatigue in the mornings.   All other systems reviewed and are otherwise negative except as noted above.  Physical Exam    VS:  BP 110/60 (BP Location: Left Arm, Patient Position: Sitting, Cuff Size: Normal)   Pulse 64   Ht _0  (1.702 m)   Wt 160 lb 2 oz (72.6 kg)   SpO2 96%   BMI 25.08 kg/m  , BMI Body mass index is 25.08 kg/m. GEN: Elderly gentleman, in no acute distress.  Joined by his wife. HEENT: normal. Neck: Supple, no JVD, carotid bruits, or masses. Cardiac: IRIR with controlled ventricular rate. no murmurs, rubs, or gallops. No clubbing, cyanosis.  Moderate to 1+ bilateral edema present and worse on the right lower extremity.  Radials/DP/PT 2+ and equal bilaterally.  Respiratory:  Respirations regular and unlabored,clear to auscultation bilaterally. GI: Soft, nontender, nondistended, BS + x 4. MS: no deformity or atrophy. Skin: warm and dry, no rash. Neuro:  Strength and sensation are intact. Psych: Normal affect.  Accessory Clinical Findings    ECG personally reviewed by me today -atrial fibrillation with ventricular rate 64 bpm, inferior lateral ischemia as noted on prior tracings- no acute changes.  VITALS Reviewed today   Temp Readings from Last 3  Encounters:  02/25/20 97.6 F (36.4 C) (Oral)  02/15/20 98.3 F (36.8 C) (Oral)   BP Readings from Last 3 Encounters:  03/07/20 110/60  02/28/20 (!) 89/66  02/25/20 (!) 150/89   Pulse Readings from Last 3 Encounters:  03/07/20 64  02/28/20 83  02/25/20 85    Wt Readings from Last 3 Encounters:  03/07/20 160 lb 2 oz (72.6 kg)  02/28/20 163 lb (73.9 kg)  02/25/20 161 lb (73 kg)     LABS  reviewed today   Lab Results  Component Value Date   WBC 11.6 (H) 02/25/2020   HGB 14.0 02/25/2020   HCT 41.1 02/25/2020   MCV 97.2 02/25/2020   PLT 280 02/25/2020   Lab Results  Component Value Date   CREATININE 1.61 (H) 02/25/2020   BUN 27 (H) 02/25/2020   NA 137 02/25/2020   K 4.3 02/25/2020   CL 98 02/25/2020   CO2 26 02/25/2020   Lab Results  Component Value Date   ALT 27 02/25/2020   AST 23 02/25/2020   ALKPHOS 83 02/25/2020   BILITOT 3.0 (H) 02/25/2020   Lab Results  Component Value Date   CHOL 139 02/14/2020   HDL 42 02/14/2020   LDLCALC 85 02/14/2020   TRIG 60 02/14/2020   CHOLHDL 3.3 02/14/2020    Lab Results  Component Value Date   HGBA1C 5.7 (H) 02/14/2020   Lab Results  Component Value Date   TSH 2.687 02/14/2020     STUDIES/PROCEDURES reviewed today   Echo 02/14/20 1. Left ventricular ejection fraction, by estimation, is 55 to 60%. The  left ventricle has normal function. The left ventricle has no regional  wall motion abnormalities. There is severe eccentric left ventricular  hypertrophy of the apical and  basal-septal segments. Left ventricular diastolic parameters are  consistent with Grade I diastolic dysfunction (impaired relaxation).  2. Right ventricular systolic function is normal. The right ventricular  size is mildly enlarged.  3. Left atrial size was severely dilated.  4. Right atrial size was moderately dilated.  5. The mitral valve is normal in structure. Mild mitral valve  regurgitation. No evidence of mitral stenosis.   6. The aortic valve is normal in  structure. Aortic valve regurgitation is  not visualized. No aortic stenosis is present.  7. The inferior vena cava is normal in size with greater than 50%  respiratory variability, suggesting right atrial pressure of 3 mmHg.  Conclusion(s)/Recommendation(s): Findings consistent with hypertrophic  obstructive cardiomyopathy.   Assessment & Plan    Chronic diastolic CHF --Reports improvement/resolution in shortness of breath.  Recent ED visit 02/25/20 for repeat exacerbation of volume status and subsequent increased lasix 36m x 5 days (12/3-12/8). Lower extremity edema noted on exam, also reported by TDarylene Price NP.  Otherwise euvolemic on exam with wt decreased from that of his HF visit on 12/6. BP and ventricular rate well controlled. TWI noted on EKG but consistent with prior tracings on review of EKGs. Most recent EF nl, G1DD, severe eccentric LVH and noted as consistent with HOCM. Consider that Afib is exacerbating his volume status. During his 11/21 admission, it was noted that DCCV should be performed if still in Afib at RTC, and following DCCV, consideration of cMRI given LVH and echo findings.   Continue lasix 226mdaily.    Recheck BMET s/p increased diuresis.  Recheck CBC/albumin given LEE / fatigue.   Reviewed s/sx of volume overload. Encouraged ongoing daily wt, BP monitoring.  Home wt 159-160lbs. Reviewed recommendations for fluid intake under 2 L and salt intake under 2 g daily. Discussed compression stockings and encouraged ongoing leg elevation for LEE.    After DCCV as outlined below, consider cMRI given concentric LVH on echo out of proportion to HTN and to rule out infiltrative heart dz, such as amyloidosis.   Persistent atrial fibrillation [place]Denies racing heart rate or palpitations.  Still in Afib today on EKG. Notes fatigue, especially in the morning.  Ventricular rates well controlled on current BB.  Discussed avoiding  Albuterol, given this can exacerbate his ventricular rates. Complaint with OAFlowery Branchnd denies any missed doses. CHA2DS2-VASc score of at least 4 (CHF, hypertension, age x2) with recommendation for indefinite anticoagulation to reduce the risk of thromboembolic event.  Discharged on reduced dose Eliquis 2.30m29mID (age and renal function). He will be therapeutic after 03/14/20, at which time DCCV can be performed. On review of echo, severe left atrial enlargement noted, which may influence his ability to hold NSR. Reassess following DCCV to ensure maintaining NSR.  Continue reduced dose Eliquis 2.30mg14mD.   Continue Lopressor 230mg64m.  BMET obtained to ensure stable renal function and electrolytes, as well as that still meets criteria for reduced dose Eliquis.   CBC obtained , given start of OAC. [place] outpatient DCCV with Dr. AridaFletcher Anonr 03/14/2020.  The risks (stroke, cardiac arrhythmias rarely resulting in the need for a temporary or permanent pacemaker, skin irritation or burns and complications associated with conscious sedation including aspiration, arrhythmia, respiratory failure and death), benefits (restoration of normal sinus rhythm) and alternatives of a direct current cardioversion were explained in detail to Mr. JohnsBollighe agrees to proceed.   HTN --Blood pressure currently well controlled with recent hypotension noted after increased oral diuresis. Check BMET.  Continue current medications.  CKD stage III --Recheck BMET / renal function and electrolytes after increased oral diuresis as outlined above.  Further recommendations, if indicated, at that time.  History of hypokalemia --Check BMET as above.  HLD --Continue Lipitor.  Medication changes: None (will check BMET to ensure meets OAC rGunnisonced dosing criteria, however) Labs ordered: CMET, CBC Studies / Imaging ordered: DCCV Future considerations: Cardiac MRI, if  recommended after DCCV Disposition: RTC after  cardioversion    Arvil Chaco, PA-C 03/07/2020

## 2020-03-08 LAB — COMPREHENSIVE METABOLIC PANEL
ALT: 32 IU/L (ref 0–44)
AST: 25 IU/L (ref 0–40)
Albumin/Globulin Ratio: 1.7 (ref 1.2–2.2)
Albumin: 4.5 g/dL (ref 3.6–4.6)
Alkaline Phosphatase: 101 IU/L (ref 44–121)
BUN/Creatinine Ratio: 11 (ref 10–24)
BUN: 19 mg/dL (ref 8–27)
Bilirubin Total: 1.6 mg/dL — ABNORMAL HIGH (ref 0.0–1.2)
CO2: 22 mmol/L (ref 20–29)
Calcium: 9.6 mg/dL (ref 8.6–10.2)
Chloride: 96 mmol/L (ref 96–106)
Creatinine, Ser: 1.67 mg/dL — ABNORMAL HIGH (ref 0.76–1.27)
GFR calc Af Amer: 42 mL/min/{1.73_m2} — ABNORMAL LOW (ref >59)
GFR calc non Af Amer: 36 mL/min/{1.73_m2} — ABNORMAL LOW (ref >59)
Globulin, Total: 2.7 g/dL (ref 1.5–4.5)
Glucose: 95 mg/dL (ref 65–99)
Potassium: 5 mmol/L (ref 3.5–5.2)
Sodium: 132 mmol/L — ABNORMAL LOW (ref 134–144)
Total Protein: 7.2 g/dL (ref 6.0–8.5)

## 2020-03-08 LAB — CBC
Hematocrit: 41.6 % (ref 37.5–51.0)
Hemoglobin: 14.4 g/dL (ref 13.0–17.7)
MCH: 33.3 pg — ABNORMAL HIGH (ref 26.6–33.0)
MCHC: 34.6 g/dL (ref 31.5–35.7)
MCV: 96 fL (ref 79–97)
Platelets: 270 10*3/uL (ref 150–450)
RBC: 4.33 x10E6/uL (ref 4.14–5.80)
RDW: 12.8 % (ref 11.6–15.4)
WBC: 7.7 10*3/uL (ref 3.4–10.8)

## 2020-03-09 ENCOUNTER — Telehealth: Payer: Self-pay | Admitting: *Deleted

## 2020-03-09 NOTE — Telephone Encounter (Signed)
Attempted to call pt to review results and recc below. No answer. LMOM TCB.

## 2020-03-09 NOTE — Telephone Encounter (Signed)
-----   Message from Lennon Alstrom, PA-C sent at 03/09/2020 12:40 PM EST ----- Renal function is on the high side of normal. Potassium is also high normal.  I see that potassium supplementation is on his chart.  --Question: Is he taking this (was not on recent ED medication list)? --If so, please have him hold it pending repeat blood work.  Bilirubin is improving. Will defer further workup of his bilirubin to his PCP. Sodium is low, which will need to be monitored closely. We can recheck this with his repeat BMET. Blood counts stable since starting Eliquis.   --Hold KCL tab pending repeat blood work. --Repeat BMET to ensure that his potassium value is a true value.

## 2020-03-10 ENCOUNTER — Other Ambulatory Visit
Admission: RE | Admit: 2020-03-10 | Discharge: 2020-03-10 | Disposition: A | Discharge status: Home / Self Care | Payer: Medicare Other | Source: Ambulatory Visit | Attending: Cardiovascular Disease | Admitting: Cardiovascular Disease

## 2020-03-10 ENCOUNTER — Telehealth: Payer: Self-pay | Admitting: *Deleted

## 2020-03-10 ENCOUNTER — Other Ambulatory Visit: Payer: Self-pay

## 2020-03-10 DIAGNOSIS — Z20822 Contact with and (suspected) exposure to covid-19: Secondary | ICD-10-CM | POA: Diagnosis not present

## 2020-03-10 DIAGNOSIS — I509 Heart failure, unspecified: Secondary | ICD-10-CM

## 2020-03-10 DIAGNOSIS — Z01812 Encounter for preprocedural laboratory examination: Secondary | ICD-10-CM | POA: Insufficient documentation

## 2020-03-10 DIAGNOSIS — Z79899 Other long term (current) drug therapy: Secondary | ICD-10-CM

## 2020-03-10 LAB — SARS CORONAVIRUS 2 (TAT 6-24 HRS): SARS Coronavirus 2: NEGATIVE

## 2020-03-10 NOTE — Telephone Encounter (Signed)
See telephone note 03/10/20 for further details. The patient has been notified of the result and verbalized understanding.  All questions (if any) were answered.

## 2020-03-10 NOTE — Telephone Encounter (Signed)
Monday if possible / before his DCCV. Thank you!!

## 2020-03-10 NOTE — Telephone Encounter (Signed)
I spoke to pt's wife, pt is taking Potassium daily. Pt has also been holding sodium in diet per instructions from hospital.  Pt did take Potassium this morning, and will HOLD for now pending blood work.  Pt is scheduled for cardioversion Tuesday next week 12/21.  I just wanted to clarify when do you want pt to have repeat Bmet?

## 2020-03-10 NOTE — Telephone Encounter (Signed)
Spoke to pt's wife, notified to have pt's Bmet repeated Monday 12/20.  She verbalized understanding and will have pt [facility]. Pt will hold Potassium until we call with results.

## 2020-03-10 NOTE — Telephone Encounter (Signed)
-----   Message from Jacquelyn D Visser, PA-C sent at 03/09/2020 12:40 PM EST ----- Renal function is on the high side of normal. Potassium is also high normal.  I see that potassium supplementation is on his chart.  --Question: Is he taking this (was not on recent ED medication list)? --If so, please have him hold it pending repeat blood work.  Bilirubin is improving. Will defer further workup of his bilirubin to his PCP. Sodium is low, which will need to be monitored closely. We can recheck this with his repeat BMET. Blood counts stable since starting Eliquis.   --Hold KCL tab pending repeat blood work. --Repeat BMET to ensure that his potassium value is a true value.   

## 2020-03-13 ENCOUNTER — Telehealth: Payer: Self-pay | Admitting: *Deleted

## 2020-03-13 ENCOUNTER — Other Ambulatory Visit: Payer: Self-pay

## 2020-03-13 ENCOUNTER — Other Ambulatory Visit
Admission: RE | Admit: 2020-03-13 | Discharge: 2020-03-13 | Disposition: A | Discharge status: Home / Self Care | Payer: Medicare Other | Attending: Physician Assistant | Admitting: Physician Assistant

## 2020-03-13 DIAGNOSIS — Z79899 Other long term (current) drug therapy: Secondary | ICD-10-CM | POA: Insufficient documentation

## 2020-03-13 DIAGNOSIS — I509 Heart failure, unspecified: Secondary | ICD-10-CM

## 2020-03-13 LAB — BASIC METABOLIC PANEL
Anion gap: 10 (ref 5–15)
BUN: 22 mg/dL (ref 8–23)
CO2: 24 mmol/L (ref 22–32)
Calcium: 9.3 mg/dL (ref 8.9–10.3)
Chloride: 96 mmol/L — ABNORMAL LOW (ref 98–111)
Creatinine, Ser: 1.8 mg/dL — ABNORMAL HIGH (ref 0.61–1.24)
GFR, Estimated: 36 mL/min — ABNORMAL LOW (ref >60)
Glucose, Bld: 129 mg/dL — ABNORMAL HIGH (ref 70–99)
Potassium: 4.1 mmol/L (ref 3.5–5.1)
Sodium: 130 mmol/L — ABNORMAL LOW (ref 135–145)

## 2020-03-13 NOTE — Telephone Encounter (Signed)
Pt scheduled for cardioversion with Dr. Kirke Corin 12/21 @ 8:00 AM. Called anesthesia and they confirmed still able to do this procedure on this date/time.

## 2020-03-14 ENCOUNTER — Ambulatory Visit: Payer: Medicare Other | Admitting: Anesthesiology

## 2020-03-14 ENCOUNTER — Encounter
Admission: RE | Disposition: A | Discharge status: Home / Self Care | Payer: Self-pay | Source: Ambulatory Visit | Attending: Cardiovascular Disease

## 2020-03-14 ENCOUNTER — Telehealth: Payer: Self-pay | Admitting: *Deleted

## 2020-03-14 ENCOUNTER — Other Ambulatory Visit: Payer: Self-pay

## 2020-03-14 ENCOUNTER — Encounter: Payer: Self-pay | Admitting: Cardiovascular Disease

## 2020-03-14 ENCOUNTER — Ambulatory Visit
Admission: RE | Admit: 2020-03-14 | Discharge: 2020-03-14 | Disposition: A | Discharge status: Home / Self Care | Payer: Medicare Other | Source: Ambulatory Visit | Attending: Cardiovascular Disease | Admitting: Cardiovascular Disease

## 2020-03-14 DIAGNOSIS — Z7982 Long term (current) use of aspirin: Secondary | ICD-10-CM | POA: Insufficient documentation

## 2020-03-14 DIAGNOSIS — I4819 Other persistent atrial fibrillation: Secondary | ICD-10-CM | POA: Diagnosis present

## 2020-03-14 DIAGNOSIS — Z7901 Long term (current) use of anticoagulants: Secondary | ICD-10-CM | POA: Insufficient documentation

## 2020-03-14 DIAGNOSIS — I5032 Chronic diastolic (congestive) heart failure: Secondary | ICD-10-CM | POA: Diagnosis not present

## 2020-03-14 DIAGNOSIS — E785 Hyperlipidemia, unspecified: Secondary | ICD-10-CM | POA: Insufficient documentation

## 2020-03-14 DIAGNOSIS — Z79899 Other long term (current) drug therapy: Secondary | ICD-10-CM | POA: Diagnosis not present

## 2020-03-14 DIAGNOSIS — Z79890 Hormone replacement therapy (postmenopausal): Secondary | ICD-10-CM | POA: Diagnosis not present

## 2020-03-14 DIAGNOSIS — N183 Chronic kidney disease, stage 3 unspecified: Secondary | ICD-10-CM | POA: Diagnosis not present

## 2020-03-14 DIAGNOSIS — I130 Hypertensive heart and chronic kidney disease with heart failure and stage 1 through stage 4 chronic kidney disease, or unspecified chronic kidney disease: Secondary | ICD-10-CM | POA: Insufficient documentation

## 2020-03-14 HISTORY — PX: CARDIOVERSION: SHX1299

## 2020-03-14 SURGERY — CARDIOVERSION
Anesthesia: General

## 2020-03-14 MED ORDER — FENTANYL CITRATE (PF) 100 MCG/2ML IJ SOLN: 25.0000 ug | INTRAMUSCULAR | Status: DC | PRN

## 2020-03-14 MED ORDER — ONDANSETRON HCL 4 MG/2ML IJ SOLN: 4.0000 mg | Freq: Once | INTRAMUSCULAR | Status: DC | PRN

## 2020-03-14 MED ORDER — PROPOFOL 10 MG/ML IV BOLUS
INTRAVENOUS | Status: AC
  Filled 2020-03-14: qty 20

## 2020-03-14 MED ORDER — PHENYLEPHRINE HCL (PRESSORS) 10 MG/ML IV SOLN
INTRAVENOUS | Status: DC | PRN
  Administered 2020-03-14: 100 ug via INTRAVENOUS

## 2020-03-14 MED ORDER — SODIUM CHLORIDE 0.9 % IV SOLN: INTRAVENOUS | Status: DC | PRN

## 2020-03-14 MED ORDER — PROPOFOL 10 MG/ML IV BOLUS
INTRAVENOUS | Status: DC | PRN
  Administered 2020-03-14: 40 mg via INTRAVENOUS

## 2020-03-14 NOTE — Telephone Encounter (Signed)
-----   Message from Lennon Alstrom, PA-C sent at 03/13/2020  6:39 PM EST ----- Potassium improved on repeat labs since holding his potassium supplementation, which is good news.  Renal function continues to bump. Sodium remains low.  He should let the office know if any increased SOB, increased weight, or increased swelling.  Will Cc Dr. Kirke Corin on this result note, given his upcoming cardioversion, and in the event of additional recommendations.

## 2020-03-14 NOTE — Transfer of Care (Signed)
Immediate Anesthesia Transfer of Care Note  Patient: Robert Frye  Procedure(s) Performed: CARDIOVERSION (N/A )  Patient Location: PACU and special recoveries  Anesthesia Type:General  Level of Consciousness: drowsy  Airway & Oxygen Therapy: Patient Spontanous Breathing and Patient connected to nasal cannula oxygen  Post-op Assessment: Report given to RN and Post -op Vital signs reviewed and stable  Post vital signs: Reviewed and stable  Last Vitals:  Vitals Value Taken Time  BP 95/59 03/14/20 0823  Temp    Pulse 58 03/14/20 0823  Resp 16 03/14/20 0823  SpO2 97 % 03/14/20 0823    Last Pain:  Vitals:   03/14/20 0747  TempSrc: Oral  PainSc: 0-No pain         Complications: No complications documented.

## 2020-03-14 NOTE — Telephone Encounter (Signed)
Attempted to call pt with results. No answer. LMOM TCB.  

## 2020-03-14 NOTE — Telephone Encounter (Signed)
Patient returning call.

## 2020-03-14 NOTE — Interval H&P Note (Signed)
History and Physical Interval Note:  03/14/2020 8:33 AM  Robert Frye  has presented today for surgery, with the diagnosis of Cardioversion   Afib   OK 2nd case Dr Henrene Hawking   Start time 8a.  The various methods of treatment have been discussed with the patient and family. After consideration of risks, benefits and other options for treatment, the patient has consented to  Procedure(s): CARDIOVERSION (N/A) as a surgical intervention.  The patient's history has been reviewed, patient examined, no change in status, stable for surgery.  I have reviewed the patient's chart and labs.  Questions were answered to the patient's satisfaction.     [name]

## 2020-03-14 NOTE — Anesthesia Preprocedure Evaluation (Signed)
Anesthesia Evaluation  Patient identified by MRN, date of birth, ID band Patient awake    Reviewed: Allergy & Precautions, NPO status , Patient's Chart, lab work & pertinent test results  Airway Mallampati: III       Dental   Pulmonary COPD,  COPD inhaler, former smoker,    Pulmonary exam normal        Cardiovascular hypertension, +CHF  + dysrhythmias Atrial Fibrillation      Neuro/Psych PSYCHIATRIC DISORDERS Dementia negative neurological ROS     GI/Hepatic negative GI ROS, Neg liver ROS,   Endo/Other  negative endocrine ROS  Renal/GU Renal InsufficiencyRenal disease  negative genitourinary   Musculoskeletal negative musculoskeletal ROS (+)   Abdominal Normal abdominal exam  (+)   Peds negative pediatric ROS (+)  Hematology  (+) Blood dyscrasia, ,   Anesthesia Other Findings Past Medical History: No date: Arrhythmia     Comment:  paroxysmal atrial fibrillation No date: CHF (congestive heart failure) (HCC) No date: Dementia (HCC) No date: Hyperlipidemia No date: Hypertension No date: Renal insufficiency No date: Thyroid disease  Reproductive/Obstetrics                             Anesthesia Physical Anesthesia Plan  ASA: III  Anesthesia Plan: General   Post-op Pain Management:    Induction: Intravenous  PONV Risk Score and Plan:   Airway Management Planned: Nasal Cannula  Additional Equipment:   Intra-op Plan:   Post-operative Plan:   Informed Consent: I have reviewed the patients History and Physical, chart, labs and discussed the procedure including the risks, benefits and alternatives for the proposed anesthesia with the patient or authorized representative who has indicated his/her understanding and acceptance.     Dental advisory given  Plan Discussed with: CRNA and Surgeon  Anesthesia Plan Comments:         Anesthesia Quick Evaluation

## 2020-03-14 NOTE — CV Procedure (Signed)
Cardioversion note: A standard informed consent was obtained. Timeout was performed. The pads were placed in the anterior posterior fashion. The patient was given propofol by the anesthesia team.  Successful cardioversion was performed with a 200 J. The patient converted to sinus rhythm. Pre-and post EKGs were reviewed. The patient tolerated the procedure with no immediate complications.  Recommendations: Continue same medications and follow-up in 2-3 weeks. Use furosemide as needed instead of daily.

## 2020-03-14 NOTE — Anesthesia Postprocedure Evaluation (Signed)
Anesthesia Post Note  Patient: Robert Frye  Procedure(s) Performed: CARDIOVERSION (N/A )  Patient location during evaluation: Specials Recovery Anesthesia Type: General Level of consciousness: awake and alert and oriented Pain management: pain level controlled Vital Signs Assessment: post-procedure vital signs reviewed and stable Respiratory status: spontaneous breathing Cardiovascular status: blood pressure returned to baseline Anesthetic complications: no   No complications documented.   Last Vitals:  Vitals:   03/14/20 0823 03/14/20 0835  BP: (!) 95/59 112/62  Pulse: (!) 58 64  Resp: 16 (!) 22  Temp:    SpO2: 97% 99%    Last Pain:  Vitals:   03/14/20 0835  TempSrc:   PainSc: 0-No pain                 Lissie Hinesley

## 2020-03-14 NOTE — Telephone Encounter (Signed)
Yes, please continue to hold the potassium. Thank you!

## 2020-03-14 NOTE — Telephone Encounter (Signed)
Spoke to pt's wife Tamela Oddi, notified of lab results. Pt just returned home from cardioversion and doing well. Tamela Oddi states that pt was told to stop daily Lasix and only take as needed. They just want to clarify that pt is to continue to also hold Potassium. Notified pt/wife to continue to hold and I will call back after discussing with provider to verify.

## 2020-03-15 NOTE — Telephone Encounter (Signed)
Spoke to pt's wife, Tamela Oddi, confirmed that pt is to continue to hold potassium. Betsy verbalized understanding. Med list updated.

## 2020-03-16 ENCOUNTER — Telehealth: Payer: Self-pay | Admitting: Cardiovascular Disease

## 2020-03-16 NOTE — Telephone Encounter (Signed)
Patient's wife verbalized understanding of Dr Jari Sportsman recommendations. She is scheduled for 03/20/20 to see Dr Azucena Cecil.

## 2020-03-16 NOTE — Telephone Encounter (Signed)
It is difficult to evaluate these kind of complaints without an office visit.  He did fine post cardioversion and the procedure was successful.  However, given his age, there might be lingering effect of propofol.  We should get him an to be seen in the office next week but if situation worsens, he should go to the ED for evaluation.

## 2020-03-16 NOTE — Telephone Encounter (Signed)
Call transferred to me. Spoke with wife. She was concerned about her husband. He had cardioversion 2 days ago. Yesterday, he woke up seemingly short of breath so they used the inhaler. This morning, he still has a dry cough (which he had prior to the cardioversion per wife). She described him as "lifeless" and sits with his eyes closed. I was very concerned with her description.  I asked to speak with patient.  Spoke directly to patient and he was very alert and oriented. Speech was clear. He said he was voiding well. Denies chest pain, shortness of breath, difficulty getting breath, or dizziness. He said he did feel tired. We talked about staying hydrated. Wife and patient both said he had not been drinking much water or anything. Advised he should drink some water as this will help flush out any anesthesia from the cardioversion that was used as well. Today, BP was 127/64, and HR 71.   Patient is hard of hearing as well. After talking with patient directly, I was relieved to hear he sounded well. I did not hear any gasping for air or breathing heavy. Advised them to increase water some and eat plenty of protein. Patient and wife verbalized understanding and will let us know if anything changes.  Patient is scheduled for follow up on 03/31/20. Routing to Dr Kirke Corin for any further advice.

## 2020-03-16 NOTE — Telephone Encounter (Signed)
Since cardioversion sob inhaler used for gasping   Pt c/o Shortness Of Breath: STAT if SOB developed within the last 24 hours or pt is noticeably SOB on the phone  1. Are you currently SOB (can you hear that pt is SOB on the phone)? Unknown speaking with wife she states he has labored breathing   2. How long have you been experiencing SOB? 2 days since cardioversion   3. Are you SOB when sitting or when up moving around? All the time   4. Are you currently experiencing any other symptoms?  Hard to hold eyes open but does answer appropriately per wife dry cough

## 2020-03-20 ENCOUNTER — Ambulatory Visit (INDEPENDENT_AMBULATORY_CARE_PROVIDER_SITE_OTHER): Payer: Medicare Other | Admitting: Cardiology

## 2020-03-20 ENCOUNTER — Other Ambulatory Visit: Payer: Self-pay

## 2020-03-20 ENCOUNTER — Encounter: Payer: Self-pay | Admitting: Cardiology

## 2020-03-20 VITALS — BP 110/70 | HR 67 | Ht 62.0 in | Wt 166.2 lb

## 2020-03-20 DIAGNOSIS — I4819 Other persistent atrial fibrillation: Secondary | ICD-10-CM | POA: Diagnosis not present

## 2020-03-20 DIAGNOSIS — I10 Essential (primary) hypertension: Secondary | ICD-10-CM | POA: Diagnosis not present

## 2020-03-20 DIAGNOSIS — I5189 Other ill-defined heart diseases: Secondary | ICD-10-CM | POA: Diagnosis not present

## 2020-03-20 MED ORDER — TORSEMIDE 20 MG PO TABS: 20.0000 mg | ORAL_TABLET | Freq: Every day | ORAL | 5 refills | Status: DC

## 2020-03-20 NOTE — Progress Notes (Signed)
Cardiology Office Note:    Date:  03/20/2020   ID:  Robert Frye, DOB November 23, 1931, MRN [id number]  PCP:  Gracelyn Nurse, MD  [facility] Cardiologist:  [name], MD  [facility] Electrophysiologist:  None   Referring MD: Gracelyn Nurse, MD   Chief Complaint  Patient presents with  . Post cardioversion.    C/o fluid retention, shortness of breath in the am and wheezing. Meds reviewed verbally with pt.    History of Present Illness:    Robert Frye is a 86 y.o. male with a hx of hypertension, hyperlipidemia, CKD 3, paroxysmal A. fib status post DC cardioversion 03/16/2020 who presents for follow-up due to shortness of breath and edema.  He had cardioversion 4 days ago due to A. fib.  States doing okay, but started feeling short of breath over the past 3 days.  His wife restarted his oral dose of Lasix 20 mg daily, with minimal improvement in symptoms.  Denies palpitations.  Compliant with medications including Eliquis, Lopressor, Lotrel.  Wife thinks he is gaining about 45 pounds over the past several days.  Also endorses 2 pillow orthopnea.  Has some dizziness occasionally when he gets up in the morning.  Echocardiogram 02/14/2020 reviewed by myself showing preserved EF, moderate LVH inferior lateral wall, mild LVH anteroseptal wall, grade 2 diastolic dysfunction, trivial effusion.  Mild LA dilatation.  Past Medical History:  Diagnosis Date  . Arrhythmia    paroxysmal atrial fibrillation  . CHF (congestive heart failure) (HCC)   . Dementia (HCC)   . Hyperlipidemia   . Hypertension   . Renal insufficiency   . Thyroid disease     Past Surgical History:  Procedure Laterality Date  . CARDIOVERSION N/A 03/14/2020   Procedure: CARDIOVERSION;  Surgeon: Iran Ouch, MD;  Location: [facility] ORS;  Service: Cardiovascular;  Laterality: N/A;    Current Medications: Current Meds  Medication Sig  . amLODipine-benazepril (LOTREL) 5-10 MG capsule Take 1 capsule by  mouth daily.  Marland Kitchen apixaban (ELIQUIS) 2.5 MG TABS tablet Take 1 tablet (2.5 mg total) by mouth 2 (two) times daily.  Marland Kitchen aspirin 81 MG EC tablet Take 81 mg by mouth daily.  Marland Kitchen atorvastatin (LIPITOR) 10 MG tablet Take 10 mg by mouth daily.  . Cholecalciferol (DIALYVITE VITAMIN D 5000) 125 MCG (5000 UT) capsule Take 5,000 Units by mouth daily.  . cyanocobalamin 1000 MCG tablet Take 1,000 mcg by mouth daily.  . furosemide (LASIX) 20 MG tablet Take 1 tablet (20 mg total) by mouth daily for 10 days. (Patient taking differently: Take 20 mg by mouth as needed.)  . levothyroxine (SYNTHROID) 75 MCG tablet Take 75 mcg by mouth daily before breakfast.  . metoprolol tartrate (LOPRESSOR) 25 MG tablet Take 1 tablet (25 mg total) by mouth 2 (two) times daily.  . Naphazoline HCl (CLEAR EYES OP) Place 1 drop into both eyes daily as needed (dry/itchy eyes).  . torsemide (DEMADEX) 20 MG tablet Take 1 tablet (20 mg total) by mouth daily.     Allergies:   Patient has no known allergies.   Social History   Socioeconomic History  . Marital status: Married    Spouse name: Not on file  . Number of children: Not on file  . Years of education: Not on file  . Highest education level: Not on file  Occupational History  . Not on file  Tobacco Use  . Smoking status: Former Games developer  . Smokeless tobacco: Never Used  Vaping Use  . Vaping Use: Never used  Substance and Sexual Activity  . Alcohol use: Not Currently  . Drug use: Never  . Sexual activity: Not on file  Other Topics Concern  . Not on file  Social History Narrative  . Not on file   Social Determinants of Health   Financial Resource Strain: Not on file  Food Insecurity: Not on file  Transportation Needs: Not on file  Physical Activity: Not on file  Stress: Not on file  Social Connections: Not on file     Family History: The patient's family history includes Heart attack in his father; Heart disease in his brother and sister.  ROS:   Please see  the history of present illness.     All other systems reviewed and are negative.  EKGs/Labs/Other Studies Reviewed:    The following studies were reviewed today: Echocardiogram 02/14/2020 reviewed by myself showing preserved EF, moderate LVH inferior lateral wall, mild LVH anteroseptal wall, grade 2 diastolic dysfunction, trivial effusion.  EKG:  EKG is  ordered today.  The ekg ordered today demonstrates atrial fibrillation, heart rate 66  Recent Labs: 02/13/2020: Magnesium 2.1 02/14/2020: TSH 2.687 02/25/2020: B Natriuretic Peptide 1,206.6 03/07/2020: ALT 32; Hemoglobin 14.4; Platelets 270 03/13/2020: BUN 22; Creatinine, Ser 1.80; Potassium 4.1; Sodium 130  Recent Lipid Panel    Component Value Date/Time   CHOL 139 02/14/2020 0410   TRIG 60 02/14/2020 0410   HDL 42 02/14/2020 0410   CHOLHDL 3.3 02/14/2020 0410   VLDL 12 02/14/2020 0410   LDLCALC 85 02/14/2020 0410     Risk Assessment/Calculations:      Physical Exam:    VS:  BP 110/70 (BP Location: Left Arm, Patient Position: Sitting, Cuff Size: Normal)   Pulse 67   Ht 5\' 2"  (1.575 m)   Wt 166 lb 4 oz (75.4 kg)   SpO2 95%   BMI 30.41 kg/m     Wt Readings from Last 3 Encounters:  03/20/20 166 lb 4 oz (75.4 kg)  03/14/20 159 lb (72.1 kg)  03/07/20 160 lb 2 oz (72.6 kg)     GEN:  Well nourished, well developed in no acute distress HEENT: Normal NECK: No JVD; No carotid bruits LYMPHATICS: No lymphadenopathy CARDIAC: Irregular irregular RESPIRATORY: No wheezing, slight decrease in breath sounds at bases ABDOMEN: Soft, non-tender, distended MUSCULOSKELETAL:  No edema; No deformity  SKIN: Warm and dry NEUROLOGIC:  Alert and oriented x 3 PSYCHIATRIC:  Normal affect   ASSESSMENT:    1. Persistent atrial fibrillation (HCC)   2. Diastolic dysfunction   3. Primary hypertension    PLAN:    In order of problems listed above:  1. Persistent atrial fibrillation, status post failed DC cardioversion 12/23.   CHA2DS2-VASc score 4 (chf, htn, age).  Heart rate controlled, continue Lopressor 25 mg twice daily, Eliquis 2.5 mg twice daily.  If patient is asymptomatic after diuretics, is unable to consider rate control strategy for A. fib moving forward. 2. Shortness of breath, grade 2 diastolic dysfunction, LVH.  Stop Lasix, start torsemide 20 mg daily.  Check BMP in 5 days.  Goal weight around 160 pounds. 3. Hypertension, hold Lotrel.  Torsemide and Lopressor as above.  Follow-up in 2 weeks    Medication Adjustments/Labs and Tests Ordered: Current medicines are reviewed at length with the patient today.  Concerns regarding medicines are outlined above.  Orders Placed This Encounter  Procedures  . Basic metabolic panel  . EKG 12-Lead   Meds  ordered this encounter  Medications  . torsemide (DEMADEX) 20 MG tablet    Sig: Take 1 tablet (20 mg total) by mouth daily.    Dispense:  30 tablet    Refill:  5    Patient Instructions  Medication Instructions:   Your physician has recommended you make the following change in your medication:   1.  STOP taking your amLODipine-benazepril (LOTREL).  2.  STOP taking your Lasix (Furosemide).  3.  START taking Torsemide 20 MG: take one tab by mouth daily.  *If you need a refill on your cardiac medications before your next appointment, please call your pharmacy*   Lab Work:  Your physician recommends that you return for lab work in:  In 5 days (Sat. 03/26/19)  - Please go to the [facility] You will check in at the front desk to the right as you walk into the atrium. Valet Parking is offered if needed. - No appointment needed. You may go any day between 7 am and 6 pm.  Testing/Procedures: None Ordered   Follow-Up: At [facility], you and your health needs are our priority.  As part of our continuing mission to provide you with exceptional heart care, we have created designated Provider Care Teams.  These Care Teams include your primary  Cardiologist (physician) and Advanced Practice Providers (APPs -  Physician Assistants and Nurse Practitioners) who all work together to provide you with the care you need, when you need it.  We recommend signing up for the patient portal called "MyChart".  Sign up information is provided on this After Visit Summary.  MyChart is used to connect with patients for Virtual Visits (Telemedicine).  Patients are able to view lab/test results, encounter notes, upcoming appointments, etc.  Non-urgent messages can be sent to your provider as well.   To learn more about what you can do with MyChart, go to [email or web address].    Your next appointment:   2 week(s)  The format for your next appointment:   In Person  Provider:   You may see [name], MD or one of the following Advanced Practice Providers on your designated Care Team:    Nicolasa Ducking, NP  Eula Listen, PA-C  Marisue Ivan, PA-C  Cadence [place], [place]  Gillian Shields, NP    Other Instructions      Signed, Debbe Odea, MD  03/20/2020 1:03 PM    [facility]

## 2020-03-20 NOTE — Patient Instructions (Signed)
Medication Instructions:   Your physician has recommended you make the following change in your medication:   1.  STOP taking your amLODipine-benazepril (LOTREL).  2.  STOP taking your Lasix (Furosemide).  3.  START taking Torsemide 20 MG: take one tab by mouth daily.  *If you need a refill on your cardiac medications before your next appointment, please call your pharmacy*   Lab Work:  Your physician recommends that you return for lab work in:  In 5 days (Sat. 03/26/19)  - Please go to the [facility]. You will check in at the front desk to the right as you walk into the atrium. Valet Parking is offered if needed. - No appointment needed. You may go any day between 7 am and 6 pm.  Testing/Procedures: None Ordered   Follow-Up: At [facility], you and your health needs are our priority.  As part of our continuing mission to provide you with exceptional heart care, we have created designated Provider Care Teams.  These Care Teams include your primary Cardiologist (physician) and Advanced Practice Providers (APPs -  Physician Assistants and Nurse Practitioners) who all work together to provide you with the care you need, when you need it.  We recommend signing up for the patient portal called "MyChart".  Sign up information is provided on this After Visit Summary.  MyChart is used to connect with patients for Virtual Visits (Telemedicine).  Patients are able to view lab/test results, encounter notes, upcoming appointments, etc.  Non-urgent messages can be sent to your provider as well.   To learn more about what you can do with MyChart, go to [email or web address].    Your next appointment:   2 week(s)  The format for your next appointment:   In Person  Provider:   You may see [name], MD or one of the following Advanced Practice Providers on your designated Care Team:    Nicolasa Ducking, NP  Eula Listen, PA-C  Marisue Ivan, PA-C  Cadence [place],  [place]  Gillian Shields, NP    Other Instructions

## 2020-03-25 ENCOUNTER — Other Ambulatory Visit
Admission: RE | Admit: 2020-03-25 | Discharge: 2020-03-25 | Disposition: A | Discharge status: Home / Self Care | Payer: Medicare Other | Attending: Cardiology | Admitting: Cardiology

## 2020-03-25 DIAGNOSIS — I4819 Other persistent atrial fibrillation: Secondary | ICD-10-CM | POA: Insufficient documentation

## 2020-03-25 LAB — BASIC METABOLIC PANEL
Anion gap: 14 (ref 5–15)
BUN: 21 mg/dL (ref 8–23)
CO2: 29 mmol/L (ref 22–32)
Calcium: 9.2 mg/dL (ref 8.9–10.3)
Chloride: 91 mmol/L — ABNORMAL LOW (ref 98–111)
Creatinine, Ser: 1.83 mg/dL — ABNORMAL HIGH (ref 0.61–1.24)
GFR, Estimated: 35 mL/min — ABNORMAL LOW (ref >60)
Glucose, Bld: 115 mg/dL — ABNORMAL HIGH (ref 70–99)
Potassium: 3 mmol/L — ABNORMAL LOW (ref 3.5–5.1)
Sodium: 134 mmol/L — ABNORMAL LOW (ref 135–145)

## 2020-03-27 ENCOUNTER — Telehealth: Payer: Self-pay

## 2020-03-27 MED ORDER — POTASSIUM CHLORIDE ER 20 MEQ PO TBCR: EXTENDED_RELEASE_TABLET | ORAL | 3 refills | Status: DC

## 2020-03-27 NOTE — Telephone Encounter (Signed)
Spoke to Dr. Mariah Milling (DOD) for patients K+ of 3.0 on 03/25/20.  He recommended that the patient start Potassium 40 MEQ for two days, and then 20 MEQ daily except Sunday. He recommends the patient does not take their Torsemide or Potassium on Sundays.  I tried to call patient and his phone would not connect. I spoke with son Raiford Noble, he stated that the patient is out of power so his phone is not working. I relayed the above information to him and he stated that he will pick up the medication and bring it to him.  I will also send the patient a MyChart message with instructions.

## 2020-03-31 ENCOUNTER — Encounter: Payer: Self-pay | Admitting: Physician Assistant

## 2020-03-31 ENCOUNTER — Ambulatory Visit (INDEPENDENT_AMBULATORY_CARE_PROVIDER_SITE_OTHER): Payer: Medicare Other | Admitting: Physician Assistant

## 2020-03-31 ENCOUNTER — Other Ambulatory Visit: Payer: Self-pay

## 2020-03-31 VITALS — BP 102/50 | HR 66 | Ht 67.0 in | Wt 153.0 lb

## 2020-03-31 DIAGNOSIS — E876 Hypokalemia: Secondary | ICD-10-CM

## 2020-03-31 DIAGNOSIS — R9431 Abnormal electrocardiogram [ECG] [EKG]: Secondary | ICD-10-CM | POA: Diagnosis not present

## 2020-03-31 DIAGNOSIS — Z8639 Personal history of other endocrine, nutritional and metabolic disease: Secondary | ICD-10-CM

## 2020-03-31 DIAGNOSIS — I4819 Other persistent atrial fibrillation: Secondary | ICD-10-CM

## 2020-03-31 DIAGNOSIS — I517 Cardiomegaly: Secondary | ICD-10-CM

## 2020-03-31 DIAGNOSIS — I5032 Chronic diastolic (congestive) heart failure: Secondary | ICD-10-CM

## 2020-03-31 DIAGNOSIS — Z79899 Other long term (current) drug therapy: Secondary | ICD-10-CM

## 2020-03-31 DIAGNOSIS — Z7901 Long term (current) use of anticoagulants: Secondary | ICD-10-CM

## 2020-03-31 DIAGNOSIS — I5189 Other ill-defined heart diseases: Secondary | ICD-10-CM

## 2020-03-31 DIAGNOSIS — E785 Hyperlipidemia, unspecified: Secondary | ICD-10-CM

## 2020-03-31 DIAGNOSIS — N1831 Chronic kidney disease, stage 3a: Secondary | ICD-10-CM

## 2020-03-31 DIAGNOSIS — I10 Essential (primary) hypertension: Secondary | ICD-10-CM

## 2020-03-31 NOTE — Patient Instructions (Addendum)
Medication Instructions:  Your physician recommends that you continue on your current medications as directed. Please refer to the Current Medication list given to you today.  *If you need a refill on your cardiac medications before your next appointment, please call your pharmacy*   Lab Work:  Your physician recommends that you have lab work TODAY: Bmet, CBC, Liver panel, TSH, Free T4  If you have labs (blood work) drawn today and your tests are completely normal, you will receive your results only by: Marland Kitchen MyChart Message (if you have MyChart) OR . A paper copy in the mail If you have any lab test that is abnormal or we need to change your treatment, we will call you to review the results.   Testing/Procedures:  [facility] MYOVIEW  Your caregiver has ordered a Hydrographic surveyor with nuclear imaging. The purpose of this test is to evaluate the blood supply to your heart muscle. This procedure is referred to as a "Non-Invasive Stress Test." This is because other than having an IV started in your vein, nothing is inserted or "invades" your body. Cardiac stress tests are done to find areas of poor blood flow to the heart by determining the extent of coronary artery disease (CAD). Some patients exercise on a treadmill, which naturally increases the blood flow to your heart, while others who are  unable to walk on a treadmill due to physical limitations have a pharmacologic/chemical stress agent called Lexiscan . This medicine will mimic walking on a treadmill by temporarily increasing your coronary blood flow.   Please note: these test may take anywhere between 2-4 hours to complete  PLEASE REPORT TO [facility] MEDICAL MALL ENTRANCE  THE VOLUNTEERS AT THE FIRST DESK WILL DIRECT YOU WHERE TO GO  Date of Procedure:_____________________________________  Arrival Time for Procedure:______________________________  Instructions regarding medication:    __X__:  Hold other medications as follows: HOLD  Torsemide (fluid pill) morning of test   PLEASE NOTIFY THE OFFICE AT LEAST 24 HOURS IN ADVANCE IF YOU ARE UNABLE TO KEEP YOUR APPOINTMENT.  [phone] AND  PLEASE NOTIFY NUCLEAR MEDICINE AT [facility] AT LEAST 24 HOURS IN ADVANCE IF YOU ARE UNABLE TO KEEP YOUR APPOINTMENT. [phone]  How to prepare for your Myoview test:  1. Do not eat or drink after midnight 2. No caffeine for 24 hours prior to test 3. No smoking 24 hours prior to test. 4. Your medication may be taken with water.  If your doctor stopped a medication because of this test, do not take that medication. 5. Please wear a short sleeve shirt. 6. No perfume, cologne or lotion. 7. Wear comfortable walking shoes.        Follow-Up: At [facility], you and your health needs are our priority.  As part of our continuing mission to provide you with exceptional heart care, we have created designated Provider Care Teams.  These Care Teams include your primary Cardiologist (physician) and Advanced Practice Providers (APPs -  Physician Assistants and Nurse Practitioners) who all work together to provide you with the care you need, when you need it.  We recommend signing up for the patient portal called "MyChart".  Sign up information is provided on this After Visit Summary.  MyChart is used to connect with patients for Virtual Visits (Telemedicine).  Patients are able to view lab/test results, encounter notes, upcoming appointments, etc.  Non-urgent messages can be sent to your provider as well.   To learn more about what you can do with MyChart, go  to [email or web address].    Your next appointment:     Follow up after Lexiscan stress test  The format for your next appointment:   In Person  Provider:   You may see [name], MD or one of the following Advanced Practice Providers on your designated Care Team:     Marisue Ivan, PA-C     Other Instructions  Advise against cough medicines or syrups that have  dextromethorphan hbr, pseudoephedrine, or phenylephrine hcl.  Look for Coricidin HBP or decongestant free medications.

## 2020-03-31 NOTE — Progress Notes (Signed)
Office Visit    Patient Name: Robert Frye Date of Encounter: 03/31/2020  Primary Care Provider:  Baxter Hire, MD Primary Cardiologist:  Kathlyn Sacramento, MD  Chief Complaint    Chief Complaint  Patient presents with   other    2 week follow up. Meds reviewed verbally with patient.     87 year old male with history of hypertension, hyperlipidemia, CKD 3, mild dementia, and evaluated today for follow-up after recent DCCV and volume overload.  Past Medical History    Past Medical History:  Diagnosis Date   Arrhythmia    paroxysmal atrial fibrillation   CHF (congestive heart failure) (HCC)    Dementia (HCC)    Hyperlipidemia    Hypertension    Renal insufficiency    Thyroid disease    Past Surgical History:  Procedure Laterality Date   CARDIOVERSION N/A 03/14/2020   Procedure: CARDIOVERSION;  Surgeon: Wellington Hampshire, MD;  Location: [facility] ORS;  Service: Cardiovascular;  Laterality: N/A;    Allergies  No Known Allergies  History of Present Illness    Robert Frye is a 87 y.o. male with PMH as above.  He presented to the ED 11/21 with 1 month of cough and new onset shortness of breath.  His wife had noted he was more short of breath within the last few days.  He had exertional dyspnea and noted abdominal distention.  Cough was nonproductive.  He ate out frequently and was not weighing himself regularly.    He was admitted to [facility] and cardiology consulted 02/13/2020.  He was noted to be in atrial fibrillation with RVR and volume overloaded  HS Tn elevated, thought 2/2 supply demand ischemia in the setting of exacerbation of diastolic heart failure with eccentric LVH, A. fib with RVR, and AKI. 02/14/2020 echo showed EF 55 to 60%, NRWMA, severe eccentric left ventricular hypertrophy of the apical and basal septal segments, G1 DD, mild RVE, severe LAE, moderate RAE, mild MR. Findings were noted to be consistent with hypertrophic obstructive cardiomyopathy on  echo report.  He was IV diuresed with slight bump in renal function, improved after holding IV Lasix.  Potassium was repleted.  He was discharged on oral Lasix 20 mg daily.  He was discharged on rate control and reduced dose Eliquis with reassessment and DCCV if still in Afib and therapeutic on [place] at that time. Possible outpatient cardiac MRI after OP cardioversion was recommended.   He was seen in the emergency department shortly after discharge 02/25/2020 with shortness of breath overnight.  He was found to be volume up again. Readmission with IV diuresis was recommended with pt preference to go home and increase oral Lasix to 20 mg twice daily for the next 5 days with close outpatient follow-up. He was seen by Darylene Price 02/28/2020 for follow-up and decreased to Lasix 20 mg daily starting the next day (02/29/2020).  Seen in clinic 03/07/2020 with improved shortness of breath and ongoing LEE. He was sleeping on 2 pillows. Home dry weight reported 159-160lbs. He was still in Afib and compliant on [facility] with DCCV scheduled. Due to increased volume status on exam, lasix increased to 20mg  daily with repeat labs collected.    DCCV 12/21 performed with restoration of NSR. At the time of DCCV, and given resulting labs at that time,  recommendation was to stop lasix 20mg  daily and take only on a PRN basis.  K supplementation was held for hyperkalemia.  Seen by Dr. Garen Lah 03/20/20 due  to recurrent SOB and edema following the cardioversion. After DCCV, pt's wife had started lasix 20mg  daily again with minimal improvement in sx. Wt gain was noted, as well as ongoing 2 pillow orthopnea. He occasionally felt dizzy when getting up in the AM. Lasix was changed to torsemide 20mg  daily. Lotrel was held.   Today, 03/31/20, he returns to clinic for reassessment of volume s/p change from lasix to torsemide. His breathing is improved s/p this change to torsemide. He denies SOB at rest now, despite SpO2 97% ORA in clnic  today. He reports improved volume status / LEE since the change to torsemide. LEE is improved on exam to mild - moderate bilateral edema.  He has been sleeping on 2 pillows; however, he notes this is a chronic finding and that he has not attempted to reduce to 1 pillow. He will try to decrease down to 1 pillow at home and update if SOB. No PND or early satiety. No abdominal distention. He believes he has lost wt since the change to torsemide at his last visit with increased urine output. He is not taking torsemide on Sundays, as instructed. Wt down from 166lbs  153lbs (12/27  1/7) with goal home dry wt 150lbs. He reports a cough, occasionally productive with gray sputum, and with recommendations provided regarding OTC medications that are safe versus not recommended for cardiac pts provided in AVS today. He states that this cough is ongoing and the reason his inhaler was prescribed; however, his wife notes that they wish to avoid further use of his inhaler at this time and given they were informed it can increase his HR and BP. Recommendations provided on AVS.  BP soft 102/50 today. His is still in Afib by EKG today (as noted on 12/27 visit) with controlled ventricular rate at 66bpm on his BB. He denies racing HR or palpitations. He does note dizziness every morning and that his head feels "crazy" at times. His wife also notes some confusion and wonders if this could be 2/2 his low K by recent labs.  He notes fatigue/malaise and listlessness, which is very concerning to his wife. No CP at rest or with exertion. EKG reviewed with pt and primary cardiologist with recommendation for MPI as outlined below. He reports compliance with reduced dose Eliquis 2.5 mg twice daily.  No s/sx of bleeding. No recent falls.  Home Medications    Outpatient Encounter Medications as of 03/31/2020  Medication Sig   apixaban (ELIQUIS) 2.5 MG TABS tablet Take 1 tablet (2.5 mg total) by mouth 2 (two) times daily.   aspirin 81 MG  EC tablet Take 81 mg by mouth daily.   atorvastatin (LIPITOR) 10 MG tablet Take 10 mg by mouth daily.   Cholecalciferol (DIALYVITE VITAMIN D 5000) 125 MCG (5000 UT) capsule Take 5,000 Units by mouth daily.   cyanocobalamin 1000 MCG tablet Take 1,000 mcg by mouth daily.   levothyroxine (SYNTHROID) 75 MCG tablet Take 75 mcg by mouth daily before breakfast.   metoprolol tartrate (LOPRESSOR) 25 MG tablet Take 1 tablet (25 mg total) by mouth 2 (two) times daily.   Naphazoline HCl (CLEAR EYES OP) Place 1 drop into both eyes daily as needed (dry/itchy eyes).   potassium chloride SA (KLOR-CON) 20 MEQ tablet Take 20 mEq by mouth daily.   torsemide (DEMADEX) 20 MG tablet Take 1 tablet (20 mg total) by mouth daily.   [DISCONTINUED] amLODipine-benazepril (LOTREL) 5-10 MG capsule Take 1 capsule by mouth daily. (Patient not taking:  Reported on 03/31/2020)   [DISCONTINUED] furosemide (LASIX) 20 MG tablet Take 1 tablet (20 mg total) by mouth daily for 10 days. (Patient not taking: Reported on 03/31/2020)   [DISCONTINUED] potassium chloride 20 MEQ TBCR Take 2 tabs (40 MEQ) by mouth for two days and then take 1 tab (20 MEQ) by mouth daily. (Patient not taking: Reported on 03/31/2020)   No facility-administered encounter medications on file as of 03/31/2020.   Review of Systems    He denies chest pain, palpitations, pnd, n, v, syncope, weight gain, or early satiety.  He reports ongoing / unchanged 2 pillow orthopnea, though he has not tried to use 1 pillow. He notes improvement in his breathing status and LEE. He reports a chronic cough with gray sputum that has been attributed to allergies. He has not noticed the mild lower extremity edema present on exam today and notes overall significant improvement from 2 weeks ago. He reports AM dizziness and "crazy head." His wife also notes confusion and wonders if this is 2/2 low K.  He reports fatigue in the mornings.   All other systems reviewed and are otherwise  negative except as noted above.  Physical Exam    VS:  BP (!) 102/50 (BP Location: Left Arm, Patient Position: Sitting, Cuff Size: Normal)    Pulse 66    Ht 5\' 7"  (1.702 m)    Wt 153 lb (69.4 kg)    SpO2 97%    BMI 23.96 kg/m  , BMI Body mass index is 23.96 kg/m. GEN: Elderly gentleman, in no acute distress.  Joined by his wife. HEENT: normal. Neck: Supple, no JVD, carotid bruits, or masses. Cardiac: IRIR with controlled ventricular rate. no murmurs, rubs, or gallops. No clubbing, cyanosis.  Mild bilateral lower extremity edema.  Radials/DP/PT 2+ and equal bilaterally.  Respiratory:  Respirations regular and unlabored,clear to auscultation bilaterally. GI: Soft, nontender, nondistended, BS + x 4. MS: no deformity or atrophy. Skin: warm and dry, no rash. Neuro:  Strength and sensation are intact. Psych: Normal affect.  Accessory Clinical Findings    ECG personally reviewed by me today -atrial fibrillation with controlled ventricular rate 66 bpm, inferior lateral ischemia as noted on prior tracings but more acute. EKG reviewed with Dr. during pt visit- no acute changes.  VITALS Reviewed today   Temp Readings from Last 3 Encounters:  03/14/20 97.8 F (36.6 C) (Oral)  02/25/20 97.6 F (36.4 C) (Oral)  02/15/20 98.3 F (36.8 C) (Oral)   BP Readings from Last 3 Encounters:  03/31/20 (!) 102/50  03/20/20 110/70  03/14/20 112/75   Pulse Readings from Last 3 Encounters:  03/31/20 66  03/20/20 67  03/14/20 63    Wt Readings from Last 3 Encounters:  03/31/20 153 lb (69.4 kg)  03/20/20 166 lb 4 oz (75.4 kg)  03/14/20 159 lb (72.1 kg)     LABS  reviewed today   Lab Results  Component Value Date   WBC 7.7 03/07/2020   HGB 14.4 03/07/2020   HCT 41.6 03/07/2020   MCV 96 03/07/2020   PLT 270 03/07/2020   Lab Results  Component Value Date   CREATININE 1.83 (H) 03/25/2020   BUN 21 03/25/2020   NA 134 (L) 03/25/2020   K 3.0 (L) 03/25/2020   CL 91 (L) 03/25/2020    CO2 29 03/25/2020   Lab Results  Component Value Date   ALT 32 03/07/2020   AST 25 03/07/2020   ALKPHOS 101 03/07/2020   BILITOT  1.6 (H) 03/07/2020   Lab Results  Component Value Date   CHOL 139 02/14/2020   HDL 42 02/14/2020   LDLCALC 85 02/14/2020   TRIG 60 02/14/2020   CHOLHDL 3.3 02/14/2020    Lab Results  Component Value Date   HGBA1C 5.7 (H) 02/14/2020   Lab Results  Component Value Date   TSH 2.687 02/14/2020     STUDIES/PROCEDURES reviewed today   Echo 02/14/20 1. Left ventricular ejection fraction, by estimation, is 55 to 60%. The  left ventricle has normal function. The left ventricle has no regional  wall motion abnormalities. There is severe eccentric left ventricular  hypertrophy of the apical and  basal-septal segments. Left ventricular diastolic parameters are  consistent with Grade I diastolic dysfunction (impaired relaxation).  2. Right ventricular systolic function is normal. The right ventricular  size is mildly enlarged.  3. Left atrial size was severely dilated.  4. Right atrial size was moderately dilated.  5. The mitral valve is normal in structure. Mild mitral valve  regurgitation. No evidence of mitral stenosis.  6. The aortic valve is normal in structure. Aortic valve regurgitation is  not visualized. No aortic stenosis is present.  7. The inferior vena cava is normal in size with greater than 50%  respiratory variability, suggesting right atrial pressure of 3 mmHg.  Conclusion(s)/Recommendation(s): Findings consistent with hypertrophic  obstructive cardiomyopathy.   Assessment & Plan    Chronic diastolic CHF Echo findings consistent with HOCM --Reports improvement/resolution in shortness of breath with torsemide (holding on Sunday). Chronic cough, attributed to allergies, which he will monitor with his other sx as below. Diuresis complicated by recurrent Afib, CM /HOCM per echo, dizziness, and recent Cr/K. 11/21 echo EF nl, G1DD,  severe eccentric LVH and noted as consistent with HOCM. Given echo, cMRI was recommended as OP but not yet performed. EKG today s/p DCCV with recurrent Afib and more pronounced TWI. Euvolemic on exam today. No CP or further SOB at rest. Dizziness and confusion noted, as well as fatigue. Wt 153 lbs and down from previous clinic visit 12/27 at 166lbs. Home dry wt at 160 lbs. BP soft.  Labs: BMET, reassess Cr / K.  Medications: Continue torsemide 20mg  daily and KCl tab for now with changes to these, if indicated, pending BMET.  Caution with diuresis, especially with dizziness and confusion reported, as well as echo findings consistent with HOCM. Continue current BB for rate control.   Workup: MPI recommended after case discussed with primary cardiologist. Will defer cMRI.  The risks [chest pain, shortness of breath, cardiac arrhythmias, dizziness, blood pressure fluctuations, myocardial infarction, stroke/transient ischemic attack, nausea, vomiting, allergic reaction, radiation exposure, metallic taste sensation and life-threatening complications (estimated to be 1 in 10,000)], benefits (risk stratification, diagnosing coronary artery disease, treatment guidance) and alternatives of a nuclear stress test were discussed in detail with Mr. Stephanie and he agrees to proceed.  Lifestyle: Monitor dizziness / volume status closely, especially given echo results with findings consistent with HOCM (discussed today). Salt under 2 g qd.  Compression stockings and elevation for mild dependent LEE noted today. OTC medications to avoid reviewed. Call the office if syncopal event, CP, or return of SOB.  Future: cMRI. Ongoing caution with diuresis. ?verapamil in future.   Persistent atrial fibrillation on OAC --Denies racing heart rate or palpitations. Back in Afib s/p recent DCCV 12/21. Consider that LAE could be contributing to recurrent Afib, as well as recurrent hypokalemia /volume overload with difficulty in  diuresis. Echo  as above with findings consistent with HOCM. Progressive fatigue and dizziness noted, especially in the morning, and since started on diuresis.  Complaint with reduced dose OAC. CHA2DS2-VASc score of at least 4 (CHF, hypertension, age x2) with recommendation for indefinite anticoagulation to reduce the risk of thromboembolic event.  Labs: BMET obtained to ensure stable Cr/K, as well as still meets criteria for reduced dose Eliquis. TSH, FT4. CBC.  Medications: Continue Lopressor 25mg  BID for rate control. Continue reduced dose Eliquis 2.5mg  BID. Avoid Albuterol and certain ingredients in OTC cold and flu medications that can exacerbate underlying cardiac issues as outlined in AVS.   Future considerations: cMRI as above. EP referral.   HTN, BP goal 130/80 or lower --Blood pressure currently well controlled to soft. Caution with diuresis. Check BMET.  Continue current medications.  CKD stage III --Recheck BMET / renal function and electrolytes after increased oral diuresis as outlined above.  Further recommendations, if indicated, at that time.  History of hypokalemia --Check BMET as above.  HLD --Continue Lipitor.  Medication changes:OTC medication recommendations of what to avoid as typed on AVS. Caution with diuretics. Any changes TBD pending lab results.  Labs ordered: BMET, CBC, TSH, FT4  Studies / Imaging ordered: MPI Future considerations: Cardiac MRI, deferred for now. ?verapamil. EP referral.  Disposition: RTC after MPI  , PA-C 03/31/2020

## 2020-04-01 LAB — BASIC METABOLIC PANEL
BUN/Creatinine Ratio: 12 (ref 10–24)
BUN: 21 mg/dL (ref 8–27)
CO2: 25 mmol/L (ref 20–29)
Calcium: 10 mg/dL (ref 8.6–10.2)
Chloride: 90 mmol/L — ABNORMAL LOW (ref 96–106)
Creatinine, Ser: 1.69 mg/dL — ABNORMAL HIGH (ref 0.76–1.27)
GFR calc Af Amer: 42 mL/min/{1.73_m2} — ABNORMAL LOW (ref >59)
GFR calc non Af Amer: 36 mL/min/{1.73_m2} — ABNORMAL LOW (ref >59)
Glucose: 97 mg/dL (ref 65–99)
Potassium: 4.5 mmol/L (ref 3.5–5.2)
Sodium: 132 mmol/L — ABNORMAL LOW (ref 134–144)

## 2020-04-01 LAB — HEPATIC FUNCTION PANEL
ALT: 22 IU/L (ref 0–44)
AST: 19 IU/L (ref 0–40)
Albumin: 4.6 g/dL (ref 3.6–4.6)
Alkaline Phosphatase: 110 IU/L (ref 44–121)
Bilirubin Total: 1.8 mg/dL — ABNORMAL HIGH (ref 0.0–1.2)
Bilirubin, Direct: 0.69 mg/dL — ABNORMAL HIGH (ref 0.00–0.40)
Total Protein: 7.3 g/dL (ref 6.0–8.5)

## 2020-04-01 LAB — CBC
Hematocrit: 42.6 % (ref 37.5–51.0)
Hemoglobin: 15.1 g/dL (ref 13.0–17.7)
MCH: 33.2 pg — ABNORMAL HIGH (ref 26.6–33.0)
MCHC: 35.4 g/dL (ref 31.5–35.7)
MCV: 94 fL (ref 79–97)
Platelets: 319 10*3/uL (ref 150–450)
RBC: 4.55 x10E6/uL (ref 4.14–5.80)
RDW: 12.8 % (ref 11.6–15.4)
WBC: 8.2 10*3/uL (ref 3.4–10.8)

## 2020-04-01 LAB — T4, FREE: Free T4: 2.34 ng/dL — ABNORMAL HIGH (ref 0.82–1.77)

## 2020-04-01 LAB — TSH: TSH: 3.03 u[IU]/mL (ref 0.450–4.500)

## 2020-04-12 ENCOUNTER — Other Ambulatory Visit: Payer: Self-pay

## 2020-04-12 ENCOUNTER — Encounter
Admission: RE | Admit: 2020-04-12 | Discharge: 2020-04-12 | Disposition: A | Discharge status: Home / Self Care | Payer: Medicare Other | Source: Ambulatory Visit | Attending: Physician Assistant | Admitting: Physician Assistant

## 2020-04-12 DIAGNOSIS — R9431 Abnormal electrocardiogram [ECG] [EKG]: Secondary | ICD-10-CM

## 2020-04-12 LAB — NM MYOCAR MULTI W/SPECT W/WALL MOTION / EF
Estimated workload: 1 METS
LV dias vol: 73 mL (ref 62–150)
LV sys vol: 47 mL
MPHR: 134 {beats}/min
Peak HR: 86 {beats}/min
Percent HR: 64 %
Rest HR: 72 {beats}/min
SDS: 1
SRS: 1
SSS: 0
TID: 1.2

## 2020-04-12 MED ORDER — REGADENOSON 0.4 MG/5ML IV SOLN
0.4000 mg | Freq: Once | INTRAVENOUS | Status: AC
  Administered 2020-04-12: 0.4 mg via INTRAVENOUS

## 2020-04-12 MED ORDER — TECHNETIUM TC 99M TETROFOSMIN IV KIT
30.6820 | PACK | Freq: Once | INTRAVENOUS | Status: AC | PRN
  Administered 2020-04-12: 30.682 via INTRAVENOUS

## 2020-04-12 MED ORDER — TECHNETIUM TC 99M TETROFOSMIN IV KIT
10.0000 | PACK | Freq: Once | INTRAVENOUS | Status: AC | PRN
  Administered 2020-04-12: 10.64 via INTRAVENOUS

## 2020-04-13 ENCOUNTER — Encounter: Payer: Self-pay | Admitting: Physician Assistant

## 2020-04-13 ENCOUNTER — Ambulatory Visit (INDEPENDENT_AMBULATORY_CARE_PROVIDER_SITE_OTHER): Payer: Medicare Other | Admitting: Physician Assistant

## 2020-04-13 VITALS — BP 82/54 | HR 76 | Ht 67.0 in | Wt 150.0 lb

## 2020-04-13 DIAGNOSIS — I5032 Chronic diastolic (congestive) heart failure: Secondary | ICD-10-CM | POA: Diagnosis not present

## 2020-04-13 DIAGNOSIS — I10 Essential (primary) hypertension: Secondary | ICD-10-CM

## 2020-04-13 DIAGNOSIS — Z79899 Other long term (current) drug therapy: Secondary | ICD-10-CM | POA: Diagnosis not present

## 2020-04-13 DIAGNOSIS — Z9889 Other specified postprocedural states: Secondary | ICD-10-CM

## 2020-04-13 DIAGNOSIS — I480 Paroxysmal atrial fibrillation: Secondary | ICD-10-CM

## 2020-04-13 DIAGNOSIS — Z8639 Personal history of other endocrine, nutritional and metabolic disease: Secondary | ICD-10-CM

## 2020-04-13 DIAGNOSIS — Z7901 Long term (current) use of anticoagulants: Secondary | ICD-10-CM

## 2020-04-13 DIAGNOSIS — E785 Hyperlipidemia, unspecified: Secondary | ICD-10-CM

## 2020-04-13 DIAGNOSIS — I5189 Other ill-defined heart diseases: Secondary | ICD-10-CM

## 2020-04-13 MED ORDER — METOPROLOL TARTRATE 25 MG PO TABS: 12.5000 mg | ORAL_TABLET | Freq: Two times a day (BID) | ORAL | 5 refills | Status: DC

## 2020-04-13 MED ORDER — POTASSIUM CHLORIDE CRYS ER 10 MEQ PO TBCR: 10.0000 meq | EXTENDED_RELEASE_TABLET | Freq: Every day | ORAL | 5 refills | Status: DC

## 2020-04-13 MED ORDER — TORSEMIDE 10 MG PO TABS: 10.0000 mg | ORAL_TABLET | Freq: Every day | ORAL | 5 refills | Status: DC

## 2020-04-13 NOTE — Progress Notes (Signed)
Office Visit    Patient Name: Robert Frye Date of Encounter: 04/13/2020  Primary Care Provider:  Gracelyn NurseJohnston, John D, MD Primary Cardiologist:  Lorine BearsMuhammad Arida, MD  Chief Complaint    Chief Complaint  Patient presents with   Follow-up    After stress test    No new Sx.    87 year old male with history of hypertension, hyperlipidemia, CKD 3, mild dementia, and evaluated today for follow-up after recent MPI.  Past Medical History    Past Medical History:  Diagnosis Date   Arrhythmia    paroxysmal atrial fibrillation   CHF (congestive heart failure) (HCC)    Dementia (HCC)    Hyperlipidemia    Hypertension    Renal insufficiency    Thyroid disease    Past Surgical History:  Procedure Laterality Date   CARDIOVERSION N/A 03/14/2020   Procedure: CARDIOVERSION;  Surgeon: Iran OuchArida, Muhammad A, MD;  Location: [facility] ORS;  Service: Cardiovascular;  Laterality: N/A;    Allergies  No Known Allergies  History of Present Illness    Robert Frye is a 87 y.o. male with PMH as above.  He presented to the ED 11/21 with 1 month of cough and new onset shortness of breath.  His wife had noted he was more short of breath within the last few days.  He had exertional dyspnea and noted abdominal distention.  Cough was nonproductive.  He ate out frequently and was not weighing himself regularly.    [facility] was [facility] and cardiology consulted 02/13/2020.  He was noted to be in atrial fibrillation with RVR and volume overloaded  HS Tn elevated, thought 2/2 supply demand ischemia in the setting of exacerbation of diastolic heart failure with eccentric LVH, A. fib with RVR, and AKI. 02/14/2020 echo showed EF 55 to 60%, NRWMA, severe eccentric left ventricular hypertrophy of the apical and basal septal segments, G1 DD, mild RVE, severe LAE, moderate RAE, mild MR. Findings were noted to be consistent with hypertrophic obstructive cardiomyopathy on echo report.  He was IV diuresed with  slight bump in renal function, improved after holding IV Lasix.  Potassium was repleted.  He was discharged on oral Lasix 20 mg daily.  He was discharged on rate control and reduced dose Eliquis with reassessment and DCCV if still in Afib and therapeutic on OAC at that time. Possible outpatient cardiac MRI after OP cardioversion was recommended.   Since that time, he has been seen in the ED and admitted due to volume overload and with need for changes to outpatient medications to prevent volume overload.  Several adjustments to both oral outpatient diuresis and potassium supplementation have been required with frequent labs.  He has also been following with the heart failure clinic. [facility]en [facility] 03/07/2020 by myself.  Due to elevated volume status on exam, lasix increased to 20mg  daily with repeat labs collected.  He was scheduled for cardioversion.  DCCV 12/21 performed with restoration of NSR.  At cardioversion, Lasix decreased to PRN basis.  K supplementation held for hyperkalemia. Seen by Dr. Azucena CecilAgbor-Etang 03/20/20 and again volume overloaded.  Lasix discontinued.  Started on torsemide 20mg  daily.  Seen again in clinic with report of improved symptoms and volume status.  He had both weight loss and increased urine output on torsemide.  Two-pillow use reported, though use of 2 pillows was out of habit with patient report that he would try to sleep on 1 pillow at night to see how this influences  breathing.  He was not taking torsemide on Sundays, as instructed.  Home goal dry weight 150 pounds.  He reported occasionally productive cough with gray sputum.  He had a feeling of heaviness in his head for a brief period of time in the mornings.  His wife also noted increasing confusion, malaise/fatigue/listlessness.  EKG showed atrial fibrillation with controlled ventricular rate.  BP soft.  He was still in atrial fibrillation without tachypalpitations.  BMET obtained to reassess electrolytes and renal  function.  No chest pain / SOB on exertion or at rest.  Case discussed with primary cardiologist, Dr. Kirke Corin, and EKG reviewed.  Recommendation was for subsequent MPI.   He underwent MPI 04/12/2020 that was ruled in intermediate risk study due to moderately reduced EF at 40% with consideration of atrial fibrillation rhythm.  Echo correlation advised with recent echo showing normal EF.  No evidence of ischemia was noted.    Today, 04/13/2020, he returns to clinic with his l wife and is doing well from a cardiac standpoint.  He denies any chest pain, shortness of breath, presyncope, syncope, racing heart rate, palpitations, or recent falls.  He is now using 1 pillow at night.  He still has a funny feeling in his head sometimes in the mornings, which is brief and reduced from previous frequency.  His wife continues to note memory issues, which she states have been ongoing since his initial hospitalization as above.  He reports compliance with reduced dose Eliquis 2.5 mg twice daily.  No s/sx of bleeding. No recent falls.  No LOC.    MPI results reviewed with patient and wife understanding.  BP hypotensive at 82/54 and 78/52, checked multiple times in each arm.  Ventricular rate 76 bpm.  Case thus discussed with Dr. Azucena Cecil while patient was still in the office.  Recommendation was for adjustment of medications as below.  Home Medications    Outpatient Encounter Medications as of 04/13/2020  Medication Sig   apixaban (ELIQUIS) 2.5 MG TABS tablet Take 1 tablet (2.5 mg total) by mouth 2 (two) times daily.   aspirin 81 MG EC tablet Take 81 mg by mouth daily.   atorvastatin (LIPITOR) 10 MG tablet Take 10 mg by mouth daily.   Cholecalciferol (DIALYVITE VITAMIN D 5000) 125 MCG (5000 UT) capsule Take 5,000 Units by mouth daily.   cyanocobalamin 1000 MCG tablet Take 1,000 mcg by mouth daily.   levothyroxine (SYNTHROID) 75 MCG tablet Take 75 mcg by mouth daily before breakfast.   metoprolol tartrate  (LOPRESSOR) 25 MG tablet Take 1 tablet (25 mg total) by mouth 2 (two) times daily.   Naphazoline HCl (CLEAR EYES OP) Place 1 drop into both eyes daily as needed (dry/itchy eyes).   potassium chloride SA (KLOR-CON) 20 MEQ tablet Take 20 mEq by mouth daily.   torsemide (DEMADEX) 20 MG tablet Take 1 tablet (20 mg total) by mouth daily.   No facility-administered encounter medications on file as of 04/13/2020.   Review of Systems    He denies chest pain, palpitations, shortness of breath, dyspnea, lower extremity edema, pnd, n, v, syncope, weight gain, or early satiety.  He is now using 1 pillow at night.  He reports occasional  "funny feeling" in his head some mornings, which is brief. His wife continues to note confusion/memory issues.  He reports improved energy.  All other systems reviewed and are otherwise negative except as noted above.  Physical Exam    VS:  BP (!) 82/54  Pulse 76    Ht 5\' 7"  (1.702 m)    Wt 150 lb (68 kg)    BMI 23.49 kg/m  , BMI Body mass index is 23.49 kg/m. GEN: Elderly gentleman, in no acute distress.  Joined by his wife. HEENT: normal. Neck: Supple, no JVD, carotid bruits, or masses. Cardiac: IRIR with controlled ventricular rate. no murmurs, rubs, or gallops. No clubbing, cyanosis.  Trace to no bilateral lower extremity edema.  Radials/DP/PT 2+ and equal bilaterally.  Respiratory: Very faint bibasilar dry crackles.  Respirations regular and unlabored. GI: Soft, nontender, nondistended, BS + x 4. MS: no deformity or atrophy. Skin: warm and dry, no rash. Neuro:  Strength and sensation are intact. Psych: Normal affect.  Accessory Clinical Findings    ECG personally reviewed by me today -atrial fibrillation with controlled ventricular rate 76 bpm, inferior lateral ischemia as noted on prior tracings- no acute changes.  VITALS Reviewed today   Temp Readings from Last 3 Encounters:  03/14/20 97.8 F (36.6 C) (Oral)  02/25/20 97.6 F (36.4 C) (Oral)   02/15/20 98.3 F (36.8 C) (Oral)   BP Readings from Last 3 Encounters:  04/13/20 (!) 82/54  03/31/20 (!) 102/50  03/20/20 110/70   Pulse Readings from Last 3 Encounters:  04/13/20 76  03/31/20 66  03/20/20 67    Wt Readings from Last 3 Encounters:  04/13/20 150 lb (68 kg)  03/31/20 153 lb (69.4 kg)  03/20/20 166 lb 4 oz (75.4 kg)     LABS  reviewed today   Lab Results  Component Value Date   WBC 8.2 03/31/2020   HGB 15.1 03/31/2020   HCT 42.6 03/31/2020   MCV 94 03/31/2020   PLT 319 03/31/2020   Lab Results  Component Value Date   CREATININE 1.69 (H) 03/31/2020   BUN 21 03/31/2020   NA 132 (L) 03/31/2020   K 4.5 03/31/2020   CL 90 (L) 03/31/2020   CO2 25 03/31/2020   Lab Results  Component Value Date   ALT 22 03/31/2020   AST 19 03/31/2020   ALKPHOS 110 03/31/2020   BILITOT 1.8 (H) 03/31/2020   Lab Results  Component Value Date   CHOL 139 02/14/2020   HDL 42 02/14/2020   LDLCALC 85 02/14/2020   TRIG 60 02/14/2020   CHOLHDL 3.3 02/14/2020    Lab Results  Component Value Date   HGBA1C 5.7 (H) 02/14/2020   Lab Results  Component Value Date   TSH 3.030 03/31/2020     STUDIES/PROCEDURES reviewed today   MPI 04/12/2020  This is an intermediate risk study due to moderately reduced EF  The left ventricular ejection fraction is moderately decreased (40%). Echo correlation advised.  There is no evidence for ischemia  Rhythm was atrial fibrillation   Echo 02/14/20 1. Left ventricular ejection fraction, by estimation, is 55 to 60%. The  left ventricle has normal function. The left ventricle has no regional  wall motion abnormalities. There is severe eccentric left ventricular  hypertrophy of the apical and  basal-septal segments. Left ventricular diastolic parameters are  consistent with Grade I diastolic dysfunction (impaired relaxation).  2. Right ventricular systolic function is normal. The right ventricular  size is mildly enlarged.  3.  Left atrial size was severely dilated.  4. Right atrial size was moderately dilated.  5. The mitral valve is normal in structure. Mild mitral valve  regurgitation. No evidence of mitral stenosis.  6. The aortic valve is normal in structure. Aortic valve regurgitation  is  not visualized. No aortic stenosis is present.  7. The inferior vena cava is normal in size with greater than 50%  respiratory variability, suggesting right atrial pressure of 3 mmHg.  Conclusion(s)/Recommendation(s): Findings consistent with hypertrophic  obstructive cardiomyopathy.   Assessment & Plan    Chronic diastolic CHF Hypertrophic Cardiomyopathy / Echo findings consistent with HOCM --Diuresis complicated by recurrent Afib, CM /HOCM per echo, dizziness, and recent Cr/K.  01/2020 echo EF nl, G1DD, severe eccentric LVH and noted as consistent with HOCM. Given echo, cMRI recommended as OP but not yet performed.  03/2020 MPI performed and ruled intermediate risk due to reduced EF and atrial fibrillation but without evidence of ischemia.  S/p DCCV with recurrent Afib. Euvolemic on exam today. No CP or further SOB at rest.  Wt 150 lbs and down from previous clinic visit.  Hypotensive on exam. Case discussed with Dr. Azucena Cecil to confirm plan and given he was reading MD for MPI.  Reduce to torsemide 20mg  daily. Reduce to KCl tab 10 M EQ.  Reduce to Lopressor 12.5 mg twice daily for rate control. At RTC, reassess sx, and if recurrent sx, consider cMRI versus repeat echo. Ongoing caution with diuresis.  Call the office if any concerning signs or symptoms between visits, as discussed today.  Persistent atrial fibrillation on OAC --Asx in Afib with controlled ventricular rate. Recurrent Afib s/p DCCV 12/21. Echo findings consistent with HOCM. Complaint with reduced dose OAC. CHA2DS2-VASc score of at least 4 (CHF, hypertension, age x2).   Reduce to Lopressor 25mg  BID for rate control. Continue Eliquis 2.5mg  BID.   HTN, BP  goal 130/80 or lower -- Hypotensive.  Case discussed with MD as above.  Plan for decreased torsemide and Lopressor as above.  Instructed to call the office if persistent hypotension, HTN, elevated rates, increased volume status with today's medication changes.  HLD --Continue Lipitor.  Medication changes: Discussed with MD as above. Decrease to torsemide 20 mg daily, KCl tab 10 M EQ, Lopressor 12.5 mg twice daily.  Labs ordered: None Studies / Imaging ordered: none Future considerations: Cardiac MRI, repeat echo if recurrent sx. Deferred for now. Disposition: RTC 3 mo or sooner if needed  , PA-C 04/13/2020

## 2020-04-13 NOTE — Patient Instructions (Signed)
Medication Instructions:  Your physician has recommended you make the following change in your medication:   1) REDUCE Metoprolol to 12.5 mg (1/2 tablet) twice daily  2) REDUCE Torsemide to 10 mg daily  3) REDUCE Potassium to 10 mEq daily  *If you need a refill on your cardiac medications before your next appointment, please call your pharmacy*   Lab Work: None ordered If you have labs (blood work) drawn today and your tests are completely normal, you will receive your results only by: Marland Kitchen MyChart Message (if you have MyChart) OR . A paper copy in the mail If you have any lab test that is abnormal or we need to change your treatment, we will call you to review the results.   Testing/Procedures: None ordered   Follow-Up: At [facility], you and your health needs are our priority.  As part of our continuing mission to provide you with exceptional heart care, we have created designated Provider Care Teams.  These Care Teams include your primary Cardiologist (physician) and Advanced Practice Providers (APPs -  Physician Assistants and Nurse Practitioners) who all work together to provide you with the care you need, when you need it.  We recommend signing up for the patient portal called "MyChart".  Sign up information is provided on this After Visit Summary.  MyChart is used to connect with patients for Virtual Visits (Telemedicine).  Patients are able to view lab/test results, encounter notes, upcoming appointments, etc.  Non-urgent messages can be sent to your provider as well.   To learn more about what you can do with MyChart, go to [email or web address].    Your next appointment:   3 month(s)  The format for your next appointment:   In Person  Provider:   You may see [name], MD or one of the following Advanced Practice Providers on your designated Care Team:    Nicolasa Ducking, NP  Eula Listen, PA-C  Marisue Ivan, PA-C  Cadence [place], [place]  Gillian Shields, NP    Other Instructions N/A

## 2020-05-02 ENCOUNTER — Ambulatory Visit: Payer: Medicare Other | Admitting: Family

## 2020-05-17 ENCOUNTER — Encounter: Payer: Self-pay | Admitting: Physician Assistant

## 2020-05-17 ENCOUNTER — Other Ambulatory Visit: Payer: Self-pay

## 2020-05-17 ENCOUNTER — Ambulatory Visit (INDEPENDENT_AMBULATORY_CARE_PROVIDER_SITE_OTHER): Payer: Medicare Other | Admitting: Physician Assistant

## 2020-05-17 VITALS — BP 110/60 | HR 86 | Ht 67.0 in | Wt 158.0 lb

## 2020-05-17 DIAGNOSIS — I4819 Other persistent atrial fibrillation: Secondary | ICD-10-CM | POA: Diagnosis not present

## 2020-05-17 DIAGNOSIS — E785 Hyperlipidemia, unspecified: Secondary | ICD-10-CM

## 2020-05-17 DIAGNOSIS — Z9889 Other specified postprocedural states: Secondary | ICD-10-CM

## 2020-05-17 DIAGNOSIS — I10 Essential (primary) hypertension: Secondary | ICD-10-CM

## 2020-05-17 DIAGNOSIS — I480 Paroxysmal atrial fibrillation: Secondary | ICD-10-CM

## 2020-05-17 DIAGNOSIS — I5032 Chronic diastolic (congestive) heart failure: Secondary | ICD-10-CM | POA: Diagnosis not present

## 2020-05-17 DIAGNOSIS — R0600 Dyspnea, unspecified: Secondary | ICD-10-CM | POA: Diagnosis not present

## 2020-05-17 DIAGNOSIS — Z7901 Long term (current) use of anticoagulants: Secondary | ICD-10-CM

## 2020-05-17 DIAGNOSIS — R0609 Other forms of dyspnea: Secondary | ICD-10-CM

## 2020-05-17 NOTE — Patient Instructions (Addendum)
Medication Instructions:  Your physician recommends that you continue on your current medications as directed. Please refer to the Current Medication list given to you today.  Please change taking extra fluid pill as needed for 2lb weight gain overnight.   *If you need a refill on your cardiac medications before your next appointment, please call your pharmacy*   Lab Work: Your physician recommends that you have lab work TODAY: CBC, Cmet, TSH  If you have labs (blood work) drawn today and your tests are completely normal, you will receive your results only by: Marland Kitchen MyChart Message (if you have MyChart) OR . A paper copy in the mail If you have any lab test that is abnormal or we need to change your treatment, we will call you to review the results.   Testing/Procedures:        Cardiac Catheterization   You are scheduled for a Cardiac Catheterization on Monday, February 28 with Dr. [name].  1. Please arrive at the Medical Mall at [facility] at 10:30 AM (This time is one hour before your procedure to ensure your preparation). Free valet parking service is available.   Special note: Every effort is made to have your procedure done on time. Please understand that emergencies sometimes delay scheduled procedures.  2. Diet: Do not eat solid foods after midnight.  The patient may have clear liquids until 5am upon the day of the procedure.  3. Labs: We will draw labs today in clinic.  4. Medication instructions in preparation for your procedure:   Contrast Allergy: No  STOP taking Eliquis (Apixiban) TWO days prior to your procedure   - Last dose will be on 2/25. HOLD 2/26 and 2/27 and do not take morning of procedure.   - Resume medication as instructed on hospital discharge  DO NOT take Torsemide (fluid pill) the MORNING of your procedure  On the morning of your procedure, take your Aspirin and any morning medicines NOT listed above.  You may use sips of water.  5. Plan for one  night stay--bring personal belongings. 6. Bring a current list of your medications and current insurance cards. 7. You MUST have a responsible person to drive you home. 8. Someone MUST be with you the first 24 hours after you arrive home or your discharge will be delayed. 9. Please wear clothes that are easy to get on and off and wear slip-on shoes.  Thank you for allowing [place] to care for you!   -- [facility]   COVID PRE- TEST: You will need a COVID TEST prior to the procedure:  LOCATION: [facility]Thru Testing site.  DATE/TIME:  Friday 05/19/20 (anytime between 8 am and 1 pm)    Follow-Up: At [facility], you and your health needs are our priority.  As part of our continuing mission to provide you with exceptional heart care, we have created designated Provider Care Teams.  These Care Teams include your primary Cardiologist (physician) and Advanced Practice Providers (APPs -  Physician Assistants and Nurse Practitioners) who all work together to provide you with the care you need, when you need it.  We recommend signing up for the patient portal called "MyChart".  Sign up information is provided on this After Visit Summary.  MyChart is used to connect with patients for Virtual Visits (Telemedicine).  Patients are able to view lab/test results, encounter notes, upcoming appointments, etc.  Non-urgent messages can be sent to your provider as well.  To learn more about what you can do with MyChart, go to [email or web address].    Your next appointment:   1 - 2 week(s)  The format for your next appointment:   In Person  Provider:   You may see [name], MD or one of the following Advanced Practice Providers on your designated Care Team:    Nicolasa Ducking, NP  Eula Listen, PA-C  Marisue Ivan, PA-C  Cadence [place], [place]  Gillian Shields, NP

## 2020-05-17 NOTE — Progress Notes (Signed)
Office Visit    Patient Name: Robert Frye Date of Encounter: 05/17/2020  Primary Care Provider:  Baxter Hire, MD Primary Cardiologist:  Kathlyn Sacramento, MD  Chief Complaint    Chief Complaint  Patient presents with  . Other    C/o no energy, sitting around with head "drooped down" and weak. Meds reviewed verbally with pt.    87 year old male with history of hypertension, hyperlipidemia, CKD 3, mild dementia, and evaluated today for follow-up with report of weakness and fatigue.  Past Medical History    Past Medical History:  Diagnosis Date  . Arrhythmia    paroxysmal atrial fibrillation  . CHF (congestive heart failure) ([place])   . Dementia ([place])   . Hyperlipidemia   . Hypertension   . Renal insufficiency   . Thyroid disease    Past Surgical History:  Procedure Laterality Date  . CARDIOVERSION N/A 03/14/2020   Procedure: CARDIOVERSION;  Surgeon: Wellington Hampshire, MD;  Location: [facility] ORS;  Service: Cardiovascular;  Laterality: N/A;    Allergies  No Known Allergies  History of Present Illness    PERL KERNEY is a 87 y.o. male with PMH as above.    He was admitted to [facility] and noted to be in atrial fibrillation with RVR, volume overloaded, and HS Tn elevated thought 2/2 supply demand ischemia. 02/14/2020 echo showed EF 55 to 60%, NRWMA, severe eccentric left ventricular hypertrophy of the apical and basal septal segments, G1DD, mild RVE, severe LAE, moderate RAE, mild MR. Findings were noted to be consistent with hypertrophic obstructive cardiomyopathy on echo report.  He was IV diuresed with slight bump in renal function, improved after holding IV Lasix.  He was discharged on oral Lasix 20 mg daily, rate control, and reduced dose Eliquis. Possible outpatient cardiac MRI after OP cardioversion was recommended.   Since that time, he has been seen in the ED and admitted due to volume overload and with need for changes to outpatient medications to prevent volume  overload.  Several adjustments to both oral outpatient diuresis and potassium supplementation have been required with frequent labs.  He has also been following with the heart failure clinic.  DCCV 12/21 performed with restoration of NSR.    Seen by Dr. Garen Lah 03/20/20 and again volume overloaded.  Lasix discontinued.  Started on torsemide 73m daily.  Seen again in clinic with report of improved symptoms and volume status. He reported occasionally productive cough with gray sputum.  He had a feeling of heaviness in his head for a brief period of time in the mornings.  His wife also noted increasing confusion, malaise/fatigue/listlessness.  EKG showed atrial fibrillation with controlled ventricular rate.  BP soft.  Case discussed with primary cardiologist, Dr. AFletcher Anon    He underwent MPI 04/12/2020 that was ruled in intermediate risk study due to moderately reduced EF at 40% with consideration of atrial fibrillation rhythm.  Echo correlation advised with recent echo showing normal EF.  No evidence of ischemia was noted.    Seen 04/13/2020 with improved volume status.  He still had a funny feeling in his head sometimes in the mornings, which was brief and reduced from previous frequency.  He reported compliance with reduced dose Eliquis 2.5 mg twice daily.   BP hypotensive at 82/54 and 78/52, checked multiple times in each arm.  Ventricular rate 76 bpm.  Case thus discussed with Dr. AGaren Lahwith recommendation to reduce to torsemide 283mdaily and KCL tab 1-mEq, reduce lopressor to  12.47m BID.  Today, 05/17/2020, he returns to clinic with his wife.  His wife expresses concern regarding ongoing issues with energy/malaise and memory.  She notes that he easily falls asleep after sitting down to rest. No recent chest pain, racing heart rate, palpitations, presyncope, or syncope. He reports no progressive DOE today, noting shortness of breath when walking from chair to chair.  This has significantly reduced  his activity level.  His wife wonders if this is due to fluid, specifically if he is retaining fluid in his abdomen.  They have been recording his weight at home, with most weights varying overnight between 2 pounds.  Home dry weight 150 pounds.  Recent wt varying between 151 to 155 pounds.  They have not been recording his BP at home.  No noticed lower extremity edema or PND.  He usually sleeps on one pillow (with the exception of 1 night on 2 pillows). No signs or symptoms of bleeding.  They report medication compliance.  Clinic BP today improved from that of previous clinic BP at 110/60.  Home Medications    Outpatient Encounter Medications as of 05/17/2020  Medication Sig  . apixaban (ELIQUIS) 2.5 MG TABS tablet Take 1 tablet (2.5 mg total) by mouth 2 (two) times daily.  .Marland Kitchenaspirin 81 MG EC tablet Take 81 mg by mouth daily.  .Marland Kitchenatorvastatin (LIPITOR) 10 MG tablet Take 10 mg by mouth daily.  . Cholecalciferol (DIALYVITE VITAMIN D 5000) 125 MCG (5000 UT) capsule Take 5,000 Units by mouth daily.  . cyanocobalamin 1000 MCG tablet Take 1,000 mcg by mouth daily.  .Marland Kitchenlevothyroxine (SYNTHROID) 75 MCG tablet Take 75 mcg by mouth daily before breakfast.  . metoprolol tartrate (LOPRESSOR) 25 MG tablet Take 0.5 tablets (12.5 mg total) by mouth 2 (two) times daily.  . Naphazoline HCl (CLEAR EYES OP) Place 1 drop into both eyes daily as needed (dry/itchy eyes).  . potassium chloride SA (KLOR-CON) 10 MEQ tablet Take 1 tablet (10 mEq total) by mouth daily. (Patient taking differently: Take 5 mEq by mouth daily.)  . torsemide (DEMADEX) 10 MG tablet Take 1 tablet (10 mg total) by mouth daily.   No facility-administered encounter medications on file as of 05/17/2020.   Review of Systems    He denies chest pain, palpitations, shortness of breath, dyspnea, lower extremity edema, pnd, n, v, syncope, weight gain, or early satiety.  He is still using 1 pillow at night.  He continues to note confusion/memory issues and  reduced energy.  All other systems reviewed and are otherwise negative except as noted above.  Physical Exam    VS:  BP 110/60 (BP Location: Left Arm, Patient Position: Sitting, Cuff Size: Normal)   Pulse 86   Ht _0  (1.702 m)   Wt 158 lb (71.7 kg)   SpO2 96%   BMI 24.75 kg/m  , BMI Body mass index is 24.75 kg/m. GEN: Elderly gentleman, in no acute distress.  Joined by his wife. HEENT: normal. Neck: Supple, no JVD, carotid bruits, or masses. Cardiac: IRIR with controlled ventricular rate. no murmurs, rubs, or gallops. No clubbing, cyanosis. Moderate to 1+ b/l LEE.  Radials/DP/PT 2+ and equal bilaterally.  Respiratory: CTAB.  Respirations regular and unlabored. GI: Soft, nontender, nondistended, BS + x 4. MS: no deformity or atrophy. Skin: warm and dry, no rash. Neuro:  Strength and sensation are intact. Psych: Normal affect.  Accessory Clinical Findings    ECG personally reviewed by me today -atrial fibrillation with controlled ventricular  rate 86 bpm, inferior lateral ischemia as noted on prior tracings with TWI V4-6, poor R wave progression III, avF- no acute changes.  VITALS Reviewed today   Temp Readings from Last 3 Encounters:  03/14/20 97.8 F (36.6 C) (Oral)  02/25/20 97.6 F (36.4 C) (Oral)  02/15/20 98.3 F (36.8 C) (Oral)   BP Readings from Last 3 Encounters:  05/17/20 110/60  04/13/20 (!) 82/54  03/31/20 (!) 102/50   Pulse Readings from Last 3 Encounters:  05/17/20 86  04/13/20 76  03/31/20 66    Wt Readings from Last 3 Encounters:  05/17/20 158 lb (71.7 kg)  04/13/20 150 lb (68 kg)  03/31/20 153 lb (69.4 kg)     LABS  reviewed today   Lab Results  Component Value Date   WBC 8.2 03/31/2020   HGB 15.1 03/31/2020   HCT 42.6 03/31/2020   MCV 94 03/31/2020   PLT 319 03/31/2020   Lab Results  Component Value Date   CREATININE 1.69 (H) 03/31/2020   BUN 21 03/31/2020   NA 132 (L) 03/31/2020   K 4.5 03/31/2020   CL 90 (L) 03/31/2020   CO2 25  03/31/2020   Lab Results  Component Value Date   ALT 22 03/31/2020   AST 19 03/31/2020   ALKPHOS 110 03/31/2020   BILITOT 1.8 (H) 03/31/2020   Lab Results  Component Value Date   CHOL 139 02/14/2020   HDL 42 02/14/2020   LDLCALC 85 02/14/2020   TRIG 60 02/14/2020   CHOLHDL 3.3 02/14/2020    Lab Results  Component Value Date   HGBA1C 5.7 (H) 02/14/2020   Lab Results  Component Value Date   TSH 3.030 03/31/2020     STUDIES/PROCEDURES reviewed today   MPI 04/12/2020  This is an intermediate risk study due to moderately reduced EF  The left ventricular ejection fraction is moderately decreased (40%). Echo correlation advised.  There is no evidence for ischemia  Rhythm was atrial fibrillation   Echo 02/14/20 1. Left ventricular ejection fraction, by estimation, is 55 to 60%. The  left ventricle has normal function. The left ventricle has no regional  wall motion abnormalities. There is severe eccentric left ventricular  hypertrophy of the apical and  basal-septal segments. Left ventricular diastolic parameters are  consistent with Grade I diastolic dysfunction (impaired relaxation).  2. Right ventricular systolic function is normal. The right ventricular  size is mildly enlarged.  3. Left atrial size was severely dilated.  4. Right atrial size was moderately dilated.  5. The mitral valve is normal in structure. Mild mitral valve  regurgitation. No evidence of mitral stenosis.  6. The aortic valve is normal in structure. Aortic valve regurgitation is  not visualized. No aortic stenosis is present.  7. The inferior vena cava is normal in size with greater than 50%  respiratory variability, suggesting right atrial pressure of 3 mmHg.  Conclusion(s)/Recommendation(s): Findings consistent with hypertrophic  obstructive cardiomyopathy.   Assessment & Plan    Chronic diastolic CHF Hypertrophic Cardiomyopathy / Echo findings consistent with HOCM --Diuresis  complicated by recurrent Afib, CM /HOCM per echo, dizziness, and recent Cr/K.  01/2020 echo EF nl, G1DD, severe eccentric LVH and noted as consistent with HOCM. Given echo, cMRI recommended as OP but not yet performed.  03/2020 MPI performed and ruled intermediate risk due to reduced EF and atrial fibrillation but without evidence of ischemia.    Continue torsemide 37m daily with KCl tab.  Continue Lopressor 12.5 mg BID  for rate control. Will discuss case with Dr. Fletcher Anon, given his DOE.  Tentative plan at this time for [facility], given wife's concern for ongoing symptoms; however, with consideration of his age and renal function.  Will check labs, given ongoing fatigue and confusion, as well as given upcoming possible cath. Ongoing caution with diuresis. Further recommendations pending discussion with MD.  Persistent atrial fibrillation on [place] --No reported tachypalpitations. Recurrent Afib s/p DCCV 12/21. Complaint with reduced dose OAC. CHA2DS2-VASc score of at least 4 (CHF, hypertension, age x2).   Continue Lopressor 12.5 BID for rate control. Continue Eliquis 2.35m BID, as he qualifies for reduced dose Eliquis based on age and renal function.   HTN, BP goal 130/80 or lower --Hypotension improved with medication changes of previous visit.  Continue current medications.  HLD --Continue Lipitor.  Medication changes: None.  Labs ordered: C-Met, TSH, CBC Studies / Imaging ordered: Tentative catheterization plan Future considerations: Cardiac MRI. Disposition: RTC after catheterization  JArvil Chaco PA-C 05/17/2020

## 2020-05-18 ENCOUNTER — Telehealth: Payer: Self-pay | Admitting: *Deleted

## 2020-05-18 LAB — COMPREHENSIVE METABOLIC PANEL
ALT: 20 IU/L (ref 0–44)
AST: 18 IU/L (ref 0–40)
Albumin/Globulin Ratio: 1.9 (ref 1.2–2.2)
Albumin: 4.4 g/dL (ref 3.6–4.6)
Alkaline Phosphatase: 83 IU/L (ref 44–121)
BUN/Creatinine Ratio: 12 (ref 10–24)
BUN: 23 mg/dL (ref 8–27)
Bilirubin Total: 1.9 mg/dL — ABNORMAL HIGH (ref 0.0–1.2)
CO2: 23 mmol/L (ref 20–29)
Calcium: 9.8 mg/dL (ref 8.6–10.2)
Chloride: 98 mmol/L (ref 96–106)
Creatinine, Ser: 1.98 mg/dL — ABNORMAL HIGH (ref 0.76–1.27)
GFR calc Af Amer: 34 mL/min/{1.73_m2} — ABNORMAL LOW (ref >59)
GFR calc non Af Amer: 29 mL/min/{1.73_m2} — ABNORMAL LOW (ref >59)
Globulin, Total: 2.3 g/dL (ref 1.5–4.5)
Glucose: 109 mg/dL — ABNORMAL HIGH (ref 65–99)
Potassium: 4.4 mmol/L (ref 3.5–5.2)
Sodium: 140 mmol/L (ref 134–144)
Total Protein: 6.7 g/dL (ref 6.0–8.5)

## 2020-05-18 LAB — CBC
Hematocrit: 40.1 % (ref 37.5–51.0)
Hemoglobin: 14.4 g/dL (ref 13.0–17.7)
MCH: 35.4 pg — ABNORMAL HIGH (ref 26.6–33.0)
MCHC: 35.9 g/dL — ABNORMAL HIGH (ref 31.5–35.7)
MCV: 99 fL — ABNORMAL HIGH (ref 79–97)
Platelets: 224 10*3/uL (ref 150–450)
RBC: 4.07 x10E6/uL — ABNORMAL LOW (ref 4.14–5.80)
RDW: 13.3 % (ref 11.6–15.4)
WBC: 8.2 10*3/uL (ref 3.4–10.8)

## 2020-05-18 LAB — TSH: TSH: 3.05 u[IU]/mL (ref 0.450–4.500)

## 2020-05-18 MED ORDER — AMIODARONE HCL 200 MG PO TABS: 200.0000 mg | ORAL_TABLET | Freq: Two times a day (BID) | ORAL | 5 refills | Status: DC

## 2020-05-18 NOTE — Telephone Encounter (Signed)
Attempted to call pt to review lab results.  °No answer. Lmtcb.  °

## 2020-05-18 NOTE — Telephone Encounter (Signed)
Spoke to pt's wife, Tamela Oddi, notified of provider's recc below.  Betsy verbalized understanding. Cath has been cancelled.  Scheduling, pre-cert and Covid pre-test notified of cancellation.  Pt will start Amiodarone 200mg  TWICE daily. Rx sent to [facility].  Will forward to scheduling to have appt scheduled with Dr. in 2 weeks w/ EKG.

## 2020-05-18 NOTE — Telephone Encounter (Signed)
Also notified pt's wife, Tamela Oddi, per Harvel Quale please have him STOP taking Aspirin and continue with Eliquis 2.5mg  BID.  Betsy verbalized understanding. Med list updated.

## 2020-05-18 NOTE — Telephone Encounter (Signed)
-----   Message from Lennon Alstrom, PA-C sent at 05/18/2020  8:01 AM EST ----- Regarding: Cancel Cath per Dr. Kirke Corin Case discussed with primary cardiologist, Dr. Kirke Corin.  Please call the patient and let him know that I discussed the case with his primary cardiologist and recommendation was to cancel cardiac catheterization.    We will instead try to get him back into NSR and keep the fluid off of him.   Dr. Kirke Corin recommends initiation of amiodarone 200 mg twice daily, which is a medication that may help hold him in normal sinus rhythm.  He can follow-up in 2 weeks with Dr. Kirke Corin with repeat EKG.  If still in atrial fibrillation, we can repeat a cardioversion on amiodarone, as he may be able to hold NSR when on this medication.  (1) Cancel cath (2) Start amiodarone 200mg  BID (already have baseline lipid and liver function) (3) Visit with Dr. in 2 weeks

## 2020-05-18 NOTE — Telephone Encounter (Signed)
-----   Message from Lennon Alstrom, PA-C sent at 05/18/2020  4:01 PM EST ----- Labs show --Kidney function bumped from previous labs 1 month ago. --Electrolytes within normal range. --Liver function stable. --Bilirubin elevated.  This has been seen on labs for some time.  Recommend follow-up with PCP for further recommendations.  Recommendations: Continue current plan as discussed via telephone note today. Baseline labs (liver function and thyroid labs) stable at start of amiodarone.

## 2020-05-18 NOTE — Addendum Note (Signed)
Addended by: Festus Aloe on: 05/18/2020 12:04 PM   Modules accepted: Orders

## 2020-05-19 ENCOUNTER — Other Ambulatory Visit: Payer: Medicare Other

## 2020-05-22 ENCOUNTER — Ambulatory Visit: Admission: RE | Admit: 2020-05-22 | Payer: Medicare Other | Source: Home / Self Care | Admitting: Cardiovascular Disease

## 2020-05-22 ENCOUNTER — Encounter: Admission: RE | Payer: Self-pay | Source: Home / Self Care

## 2020-05-22 DIAGNOSIS — I5023 Acute on chronic systolic (congestive) heart failure: Secondary | ICD-10-CM

## 2020-05-22 SURGERY — RIGHT/LEFT HEART CATH AND CORONARY ANGIOGRAPHY
Anesthesia: Moderate Sedation

## 2020-05-23 NOTE — Telephone Encounter (Signed)
Pt scheduled 06/01/20 with Dr. Kirke Corin for 2 week follow up with EKG.

## 2020-05-24 ENCOUNTER — Telehealth: Payer: Self-pay | Admitting: Physician Assistant

## 2020-05-24 NOTE — Telephone Encounter (Signed)
Patient has appointment with Dr. Kirke Corin 3/10

## 2020-05-24 NOTE — Telephone Encounter (Signed)
Pt c/o medication issue:  1. Name of Medication: Amiodarone  2. How are you currently taking this medication (dosage and times per day)? 200 mg twice a day  3. Are you having a reaction (difficulty breathing--STAT)? SOB.  4. What is your medication issue? SOB and waking up at night. Extremely dizzy.

## 2020-05-24 NOTE — Telephone Encounter (Signed)
Spoke to pt's wife, Tamela Oddi.  Pt started Amiodarone 200mg  BID 1 week ago on 2/24.  To follow up with Dr. 3/24 3/10 for 2 week follow up.  Wife reports pt c/o incr weakness, intermittent light headedness and dyspnea on minimal exertion. States these symptoms were present PRIOR to Amiodarone, however, weakness has increased.  Pt has not been monitoring BP/HR at home. Only took BP two days ago and was 104/60.  Explained to wife how to find HR reading on BP machine, and she verbalized understanding how to monitor HR now as well.   Wife checked pt's BP/HR while on the phone just now: 109/74  HR 126 Pt denies SOB now, but does feel "just don't feel well."  Explained to wife, that if pt's HR has been elevated like this consistently, then likely can cause his symptoms.  Spoke to Kirke Corin, NP in office regarding above info, new orders received to: Increase Metoprolol Tartrate (from 12.5mg  BID) to 25mg  BID and to call our office Friday to update. Also to monitor BP/HR at least daily and also take when pt feels symptoms of SOB or light headedness.  Also advised that pt ensure to stay well hydrated and eat well in the meantime.  Wife verbalized understanding. She will give another half tablet now of Metoprolol Tartrate (12.5mg ) and will give whole tablet this evening, then will proceed with 25mg  BID.  Pt/wife will call office with any changes in the interim.

## 2020-05-25 NOTE — Telephone Encounter (Signed)
Noted. Agree with recommendation. Will look out for BP/HR updates tomorrow.

## 2020-05-26 NOTE — Telephone Encounter (Signed)
Wife calling back for report readings   BP  HR 3/3 am  109/75  123 3/3 pm  127/74  80 3/4 am  113/91  124 3/4 am  92/61  79 3/4 pm  105/65  78  Pateint also complaining of jaw pain and not eating or drinking like her should be

## 2020-05-27 ENCOUNTER — Emergency Department: Payer: Medicare Other

## 2020-05-27 ENCOUNTER — Encounter: Payer: Self-pay | Admitting: Intensive Care

## 2020-05-27 ENCOUNTER — Inpatient Hospital Stay: Payer: Medicare Other

## 2020-05-27 ENCOUNTER — Other Ambulatory Visit: Payer: Self-pay

## 2020-05-27 ENCOUNTER — Inpatient Hospital Stay
Admission: EM | Admit: 2020-05-27 | Discharge: 2020-06-01 | DRG: 291 | Disposition: A | Discharge status: Home Health | Payer: Medicare Other | Attending: Student | Admitting: Student

## 2020-05-27 DIAGNOSIS — E039 Hypothyroidism, unspecified: Secondary | ICD-10-CM | POA: Diagnosis present

## 2020-05-27 DIAGNOSIS — R7401 Elevation of levels of liver transaminase levels: Secondary | ICD-10-CM

## 2020-05-27 DIAGNOSIS — I422 Other hypertrophic cardiomyopathy: Secondary | ICD-10-CM | POA: Diagnosis present

## 2020-05-27 DIAGNOSIS — N1831 Chronic kidney disease, stage 3a: Secondary | ICD-10-CM | POA: Diagnosis present

## 2020-05-27 DIAGNOSIS — I248 Other forms of acute ischemic heart disease: Secondary | ICD-10-CM | POA: Diagnosis present

## 2020-05-27 DIAGNOSIS — R0600 Dyspnea, unspecified: Secondary | ICD-10-CM | POA: Diagnosis not present

## 2020-05-27 DIAGNOSIS — Z87891 Personal history of nicotine dependence: Secondary | ICD-10-CM

## 2020-05-27 DIAGNOSIS — Z20822 Contact with and (suspected) exposure to covid-19: Secondary | ICD-10-CM | POA: Diagnosis present

## 2020-05-27 DIAGNOSIS — R0602 Shortness of breath: Secondary | ICD-10-CM | POA: Diagnosis present

## 2020-05-27 DIAGNOSIS — I10 Essential (primary) hypertension: Secondary | ICD-10-CM | POA: Diagnosis present

## 2020-05-27 DIAGNOSIS — T502X5A Adverse effect of carbonic-anhydrase inhibitors, benzothiadiazides and other diuretics, initial encounter: Secondary | ICD-10-CM | POA: Diagnosis present

## 2020-05-27 DIAGNOSIS — F0390 Unspecified dementia without behavioral disturbance: Secondary | ICD-10-CM | POA: Diagnosis present

## 2020-05-27 DIAGNOSIS — I5033 Acute on chronic diastolic (congestive) heart failure: Secondary | ICD-10-CM

## 2020-05-27 DIAGNOSIS — R778 Other specified abnormalities of plasma proteins: Secondary | ICD-10-CM | POA: Diagnosis not present

## 2020-05-27 DIAGNOSIS — E876 Hypokalemia: Secondary | ICD-10-CM | POA: Diagnosis present

## 2020-05-27 DIAGNOSIS — I5023 Acute on chronic systolic (congestive) heart failure: Secondary | ICD-10-CM | POA: Diagnosis present

## 2020-05-27 DIAGNOSIS — D6859 Other primary thrombophilia: Secondary | ICD-10-CM | POA: Diagnosis present

## 2020-05-27 DIAGNOSIS — I5020 Unspecified systolic (congestive) heart failure: Secondary | ICD-10-CM

## 2020-05-27 DIAGNOSIS — I5043 Acute on chronic combined systolic (congestive) and diastolic (congestive) heart failure: Secondary | ICD-10-CM | POA: Diagnosis present

## 2020-05-27 DIAGNOSIS — Z8249 Family history of ischemic heart disease and other diseases of the circulatory system: Secondary | ICD-10-CM | POA: Diagnosis not present

## 2020-05-27 DIAGNOSIS — I4819 Other persistent atrial fibrillation: Secondary | ICD-10-CM | POA: Diagnosis present

## 2020-05-27 DIAGNOSIS — Z79899 Other long term (current) drug therapy: Secondary | ICD-10-CM | POA: Diagnosis not present

## 2020-05-27 DIAGNOSIS — N179 Acute kidney failure, unspecified: Secondary | ICD-10-CM | POA: Diagnosis present

## 2020-05-27 DIAGNOSIS — I480 Paroxysmal atrial fibrillation: Secondary | ICD-10-CM | POA: Diagnosis not present

## 2020-05-27 DIAGNOSIS — K761 Chronic passive congestion of liver: Secondary | ICD-10-CM | POA: Diagnosis present

## 2020-05-27 DIAGNOSIS — D6869 Other thrombophilia: Secondary | ICD-10-CM | POA: Diagnosis present

## 2020-05-27 DIAGNOSIS — N183 Chronic kidney disease, stage 3 unspecified: Secondary | ICD-10-CM | POA: Diagnosis not present

## 2020-05-27 DIAGNOSIS — Z7901 Long term (current) use of anticoagulants: Secondary | ICD-10-CM

## 2020-05-27 DIAGNOSIS — E785 Hyperlipidemia, unspecified: Secondary | ICD-10-CM | POA: Diagnosis present

## 2020-05-27 DIAGNOSIS — R7989 Other specified abnormal findings of blood chemistry: Secondary | ICD-10-CM | POA: Diagnosis present

## 2020-05-27 DIAGNOSIS — J90 Pleural effusion, not elsewhere classified: Secondary | ICD-10-CM

## 2020-05-27 DIAGNOSIS — I4892 Unspecified atrial flutter: Secondary | ICD-10-CM

## 2020-05-27 DIAGNOSIS — N1832 Chronic kidney disease, stage 3b: Secondary | ICD-10-CM | POA: Diagnosis present

## 2020-05-27 DIAGNOSIS — Z79890 Hormone replacement therapy (postmenopausal): Secondary | ICD-10-CM

## 2020-05-27 DIAGNOSIS — H9190 Unspecified hearing loss, unspecified ear: Secondary | ICD-10-CM | POA: Diagnosis present

## 2020-05-27 DIAGNOSIS — J9601 Acute respiratory failure with hypoxia: Secondary | ICD-10-CM | POA: Diagnosis present

## 2020-05-27 DIAGNOSIS — Z66 Do not resuscitate: Secondary | ICD-10-CM | POA: Diagnosis present

## 2020-05-27 DIAGNOSIS — I130 Hypertensive heart and chronic kidney disease with heart failure and stage 1 through stage 4 chronic kidney disease, or unspecified chronic kidney disease: Secondary | ICD-10-CM | POA: Diagnosis present

## 2020-05-27 DIAGNOSIS — T17908A Unspecified foreign body in respiratory tract, part unspecified causing other injury, initial encounter: Secondary | ICD-10-CM

## 2020-05-27 HISTORY — DX: Personal history of other medical treatment: Z92.89

## 2020-05-27 HISTORY — DX: Other hypertrophic cardiomyopathy: I42.2

## 2020-05-27 HISTORY — DX: Unspecified diastolic (congestive) heart failure: I50.30

## 2020-05-27 HISTORY — DX: Other persistent atrial fibrillation: I48.19

## 2020-05-27 HISTORY — DX: Hypothyroidism, unspecified: E03.9

## 2020-05-27 HISTORY — DX: Chronic kidney disease, stage 3 unspecified: N18.30

## 2020-05-27 LAB — BASIC METABOLIC PANEL
Anion gap: 12 (ref 5–15)
BUN: 49 mg/dL — ABNORMAL HIGH (ref 8–23)
CO2: 23 mmol/L (ref 22–32)
Calcium: 9.3 mg/dL (ref 8.9–10.3)
Chloride: 100 mmol/L (ref 98–111)
Creatinine, Ser: 2.26 mg/dL — ABNORMAL HIGH (ref 0.61–1.24)
GFR, Estimated: 27 mL/min — ABNORMAL LOW (ref >60)
Glucose, Bld: 123 mg/dL — ABNORMAL HIGH (ref 70–99)
Potassium: 3.9 mmol/L (ref 3.5–5.1)
Sodium: 135 mmol/L (ref 135–145)

## 2020-05-27 LAB — RESP PANEL BY RT-PCR (FLU A&B, COVID) ARPGX2
Influenza A by PCR: NEGATIVE
Influenza B by PCR: NEGATIVE
SARS Coronavirus 2 by RT PCR: NEGATIVE

## 2020-05-27 LAB — IRON AND TIBC
Iron: 66 ug/dL (ref 45–182)
Saturation Ratios: 18 % (ref 17.9–39.5)
TIBC: 367 ug/dL (ref 250–450)
UIBC: 301 ug/dL

## 2020-05-27 LAB — CBC
HCT: 44.4 % (ref 39.0–52.0)
Hemoglobin: 14.8 g/dL (ref 13.0–17.0)
MCH: 34 pg (ref 26.0–34.0)
MCHC: 33.3 g/dL (ref 30.0–36.0)
MCV: 102.1 fL — ABNORMAL HIGH (ref 80.0–100.0)
Platelets: 282 10*3/uL (ref 150–400)
RBC: 4.35 MIL/uL (ref 4.22–5.81)
RDW: 15.6 % — ABNORMAL HIGH (ref 11.5–15.5)
WBC: 12.9 10*3/uL — ABNORMAL HIGH (ref 4.0–10.5)
nRBC: 0.5 % — ABNORMAL HIGH (ref 0.0–0.2)

## 2020-05-27 LAB — HEPATIC FUNCTION PANEL
ALT: 92 U/L — ABNORMAL HIGH (ref 0–44)
AST: 66 U/L — ABNORMAL HIGH (ref 15–41)
Albumin: 3.9 g/dL (ref 3.5–5.0)
Alkaline Phosphatase: 71 U/L (ref 38–126)
Bilirubin, Direct: 1.4 mg/dL — ABNORMAL HIGH (ref 0.0–0.2)
Indirect Bilirubin: 2.5 mg/dL — ABNORMAL HIGH (ref 0.3–0.9)
Total Bilirubin: 3.9 mg/dL — ABNORMAL HIGH (ref 0.3–1.2)
Total Protein: 6.7 g/dL (ref 6.5–8.1)

## 2020-05-27 LAB — TROPONIN I (HIGH SENSITIVITY)
Troponin I (High Sensitivity): 218 ng/L (ref <18)
Troponin I (High Sensitivity): 202 ng/L (ref <18)

## 2020-05-27 LAB — FERRITIN: Ferritin: 159 ng/mL (ref 24–336)

## 2020-05-27 LAB — PROTIME-INR
INR: 2.4 — ABNORMAL HIGH (ref 0.8–1.2)
Prothrombin Time: 24.9 seconds — ABNORMAL HIGH (ref 11.4–15.2)

## 2020-05-27 LAB — BRAIN NATRIURETIC PEPTIDE: B Natriuretic Peptide: 2489.7 pg/mL — ABNORMAL HIGH (ref 0.0–100.0)

## 2020-05-27 LAB — TSH: TSH: 3.088 u[IU]/mL (ref 0.350–4.500)

## 2020-05-27 MED ORDER — FUROSEMIDE 10 MG/ML IJ SOLN
40.0000 mg | Freq: Two times a day (BID) | INTRAMUSCULAR | Status: DC
  Administered 2020-05-27 – 2020-05-29 (×4): 40 mg via INTRAVENOUS
  Filled 2020-05-27 (×4): qty 4

## 2020-05-27 MED ORDER — POTASSIUM CHLORIDE CRYS ER 10 MEQ PO TBCR
10.0000 meq | EXTENDED_RELEASE_TABLET | Freq: Every day | ORAL | Status: DC
  Administered 2020-05-27 – 2020-05-29 (×3): 10 meq via ORAL
  Filled 2020-05-27 (×3): qty 1

## 2020-05-27 MED ORDER — APIXABAN 2.5 MG PO TABS
2.5000 mg | ORAL_TABLET | Freq: Two times a day (BID) | ORAL | Status: DC
  Administered 2020-05-27 – 2020-06-01 (×11): 2.5 mg via ORAL
  Filled 2020-05-27 (×12): qty 1

## 2020-05-27 MED ORDER — SODIUM CHLORIDE 0.9% FLUSH: 3.0000 mL | INTRAVENOUS | Status: DC | PRN

## 2020-05-27 MED ORDER — VITAMIN B-12 1000 MCG PO TABS
1000.0000 ug | ORAL_TABLET | Freq: Every day | ORAL | Status: DC
  Administered 2020-05-27 – 2020-06-01 (×6): 1000 ug via ORAL
  Filled 2020-05-27 (×6): qty 1

## 2020-05-27 MED ORDER — ATORVASTATIN CALCIUM 10 MG PO TABS
10.0000 mg | ORAL_TABLET | Freq: Every day | ORAL | Status: DC
Start: 2020-05-27 — End: 2020-05-27

## 2020-05-27 MED ORDER — LEVOTHYROXINE SODIUM 50 MCG PO TABS: 75.0000 ug | ORAL_TABLET | Freq: Every day | ORAL | Status: DC

## 2020-05-27 MED ORDER — LEVOTHYROXINE SODIUM 50 MCG PO TABS
75.0000 ug | ORAL_TABLET | Freq: Every day | ORAL | Status: DC
  Administered 2020-05-28 – 2020-06-01 (×4): 75 ug via ORAL
  Filled 2020-05-27 (×4): qty 1

## 2020-05-27 MED ORDER — SODIUM CHLORIDE 0.9 % IV SOLN: 250.0000 mL | INTRAVENOUS | Status: DC | PRN

## 2020-05-27 MED ORDER — ACETAMINOPHEN 325 MG PO TABS: 650.0000 mg | ORAL_TABLET | ORAL | Status: DC | PRN

## 2020-05-27 MED ORDER — VITAMIN D3 25 MCG (1000 UNIT) PO TABS
5000.0000 [IU] | ORAL_TABLET | Freq: Every day | ORAL | Status: DC
  Administered 2020-05-28 – 2020-06-01 (×5): 5000 [IU] via ORAL
  Filled 2020-05-27 (×10): qty 5

## 2020-05-27 MED ORDER — FUROSEMIDE 10 MG/ML IJ SOLN: 20.0000 mg | Freq: Two times a day (BID) | INTRAMUSCULAR | Status: DC

## 2020-05-27 MED ORDER — ONDANSETRON HCL 4 MG/2ML IJ SOLN: 4.0000 mg | Freq: Four times a day (QID) | INTRAMUSCULAR | Status: DC | PRN

## 2020-05-27 MED ORDER — SODIUM CHLORIDE 0.9% FLUSH
3.0000 mL | Freq: Two times a day (BID) | INTRAVENOUS | Status: DC
  Administered 2020-05-27 – 2020-06-01 (×6): 3 mL via INTRAVENOUS

## 2020-05-27 NOTE — ED Notes (Signed)
Assisted pt to [facility] for BM. Pt had 1 BM and 1 unmeasured void. Unable to send urine specimen because urine had stool in it. Pt back in bed.

## 2020-05-27 NOTE — ED Notes (Signed)
RN aware bed assigned ?

## 2020-05-27 NOTE — ED Provider Notes (Addendum)
[facility] Emergency Department Provider Note  ____________________________________________   Event Date/Time   First MD Initiated Contact with Patient 05/27/20 1207     (approximate)  I have reviewed the triage vital signs and the nursing notes.   HISTORY  Chief Complaint Shortness of Breath    HPI Robert Frye is a 87 y.o. male presents emergency department with his wife.  His wife states that he has not been ambulatory as usual.  He is very short of breath when he goes from the bed to the bathroom.  He is not urinating as much and his abdomen is distended.  Patient has recently developed A. fib and his physician had him on metoprolol which he increased his dose last week and then added amiodarone.  Patient is also on Eliquis.  History of thyroid problems but takes levothyroxine 75 mcg a day.  His wife states that the shortness of breath is gotten much worse over the past 4 to 5 days.  He was hospitalized in November for the same and had a second ER visit.  She states that he more than likely needs to be admitted.  States his abdomen looks swollen to her.    Past Medical History:  Diagnosis Date  . Arrhythmia    paroxysmal atrial fibrillation  . Atrial fibrillation (HCC)   . CHF (congestive heart failure) (HCC)   . Dementia (HCC)   . Hyperlipidemia   . Hypertension   . Renal insufficiency   . Thyroid disease     Patient Active Problem List   Diagnosis Date Noted  . Acute on chronic systolic (congestive) heart failure (HCC) 05/27/2020  . Persistent atrial fibrillation (HCC)   . Acute diastolic CHF (congestive heart failure) (HCC)   . Acquired thrombophilia (HCC)   . Acute kidney injury superimposed on CKD (HCC)   . Paroxysmal atrial fibrillation (HCC) 02/13/2020  . Hypokalemia 02/13/2020  . Elevated troponin 02/13/2020  . Essential hypertension 02/13/2020  . HLD (hyperlipidemia) 02/13/2020  . CKD (chronic kidney disease), stage IIIa  02/13/2020  . Acute CHF (congestive heart failure) (HCC) 02/13/2020    Past Surgical History:  Procedure Laterality Date  . CARDIOVERSION N/A 03/14/2020   Procedure: CARDIOVERSION;  Surgeon: Iran Ouch, MD;  Location: [facility] ORS;  Service: Cardiovascular;  Laterality: N/A;    Prior to Admission medications   Medication Sig Start Date End Date Taking? Authorizing Provider  amiodarone (PACERONE) 200 MG tablet Take 1 tablet (200 mg total) by mouth 2 (two) times daily. 05/18/20 11/14/20  Marisue Ivan D, PA-C  apixaban (ELIQUIS) 2.5 MG TABS tablet Take 1 tablet (2.5 mg total) by mouth 2 (two) times daily. 03/07/20 03/02/21  Marisue Ivan D, PA-C  atorvastatin (LIPITOR) 10 MG tablet Take 10 mg by mouth daily. 11/17/14   [provider]  Cholecalciferol (DIALYVITE VITAMIN D 5000) 125 MCG (5000 UT) capsule Take 5,000 Units by mouth daily.    [provider]  cyanocobalamin 1000 MCG tablet Take 1,000 mcg by mouth daily.    [provider]  levothyroxine (SYNTHROID) 75 MCG tablet Take 75 mcg by mouth daily before breakfast. 11/12/14   [provider]  metoprolol tartrate (LOPRESSOR) 25 MG tablet Take 0.5 tablets (12.5 mg total) by mouth 2 (two) times daily. 04/13/20 04/08/21  Marisue Ivan D, PA-C  Naphazoline HCl (CLEAR EYES OP) Place 1 drop into both eyes daily as needed (dry/itchy eyes).    [provider]  potassium chloride SA (KLOR-CON) 10  MEQ tablet Take 1 tablet (10 mEq total) by mouth daily. Patient taking differently: Take 5 mEq by mouth daily. 04/13/20   Marisue Ivan D, PA-C  torsemide (DEMADEX) 10 MG tablet Take 1 tablet (10 mg total) by mouth daily. 04/13/20 07/12/20  Marisue Ivan D, PA-C    Allergies Patient has no known allergies.  Family History  Problem Relation Age of Onset  . Heart attack Father   . Heart disease Sister   . Heart disease Brother     Social History Social History   Tobacco Use  . Smoking  status: Former Games developer  . Smokeless tobacco: Never Used  Vaping Use  . Vaping Use: Never used  Substance Use Topics  . Alcohol use: Not Currently  . Drug use: Never    Review of Systems  Constitutional: No fever/chills Eyes: No visual changes. ENT: No sore throat. Respiratory: Denies cough, complains of shortness of breath Cardiovascular: Denies chest pain, history of A. fib Gastrointestinal: Denies abdominal pain, wife states his abdomen looks distended Genitourinary: Negative for dysuria.  Has decreased urine output Musculoskeletal: Negative for back pain. Skin: Negative for rash. Psychiatric: no mood changes, history of dementia    ____________________________________________   PHYSICAL EXAM:  VITAL SIGNS: ED Triage Vitals  Enc Vitals Group     BP 05/27/20 1139 114/74     Pulse Rate 05/27/20 1139 77     Resp 05/27/20 1139 16     Temp 05/27/20 1139 (!) 97.5 F (36.4 C)     Temp Source 05/27/20 1139 Oral     SpO2 05/27/20 1139 100 %     Weight 05/27/20 1134 156 lb 6.4 oz (70.9 kg)     Height 05/27/20 1134 5\' 7"  (1.702 m)     Head Circumference --      Peak Flow --      Pain Score 05/27/20 1134 0     Pain Loc --      Pain Edu? --      Excl. in GC? --     Constitutional: Alert and oriented. Well appearing and in no acute distress. Eyes: Conjunctivae are normal.  Head: Atraumatic. Nose: No congestion/rhinnorhea. Mouth/Throat: Mucous membranes are moist.   Neck:  supple no lymphadenopathy noted Cardiovascular: Normal rate, irregular rhythm. Heart sounds are normal Respiratory: Normal respiratory effort.  No retractions, lungs with decreased breath sounds bilateral Abd: soft nontender bs normal all 4 quad GU: deferred Musculoskeletal: FROM all extremities, warm and well perfused, 2+ pitting edema in lower extremities bilaterally Neurologic:  Normal speech and language.  Skin:  Skin is warm, dry and intact. No rash noted. Psychiatric: Mood and affect are  normal. Speech and behavior are normal.  Patient is at baseline for dementia  ____________________________________________   LABS (all labs ordered are listed, but only abnormal results are displayed)  Labs Reviewed  BASIC METABOLIC PANEL - Abnormal; Notable for the following components:      Result Value   Glucose, Bld 123 (*)    BUN 49 (*)    Creatinine, Ser 2.26 (*)    GFR, Estimated 27 (*)    All other components within normal limits  CBC - Abnormal; Notable for the following components:   WBC 12.9 (*)    MCV 102.1 (*)    RDW 15.6 (*)    nRBC 0.5 (*)    All other components within normal limits  PROTIME-INR - Abnormal; Notable for the following components:   Prothrombin Time 24.9 (*)  INR 2.4 (*)    All other components within normal limits  HEPATIC FUNCTION PANEL - Abnormal; Notable for the following components:   AST 66 (*)    ALT 92 (*)    Total Bilirubin 3.9 (*)    Bilirubin, Direct 1.4 (*)    Indirect Bilirubin 2.5 (*)    All other components within normal limits  TROPONIN I (HIGH SENSITIVITY) - Abnormal; Notable for the following components:   Troponin I (High Sensitivity) 218 (*)    All other components within normal limits  RESP PANEL BY RT-PCR (FLU A&B, COVID) ARPGX2  TSH  URINALYSIS, COMPLETE (UACMP) WITH MICROSCOPIC  TROPONIN I (HIGH SENSITIVITY)   ____________________________________________   ____________________________________________  RADIOLOGY  Chest x-ray  ____________________________________________   PROCEDURES  Procedure(s) performed: EKG, see physician read  Procedures    ____________________________________________   INITIAL IMPRESSION / ASSESSMENT AND PLAN / ED COURSE  Pertinent labs & imaging results that were available during my care of the patient were reviewed by me and considered in my medical decision making (see chart for details).   The patient is an 87 year old male presents emergency department shortness of  breath and weakness.  See HPI.  Physical exam shows patient to be in A. fib and has a decreased oxygen of 92% on room air, pitting edema noted in lower extremities bilaterally  In speaking with the wife and over reviewing his past medical history I feel the patient will end up being admitted.  DDx: CHF, A. fib, Covid, effusion, CAP, MI  CBC is elevated WBC of 12.9, basic metabolic panel shows increased BUN and creatinine which have trended upwards since his last labs 10 days ago. Troponin is elevated at 218, 3 months ago he was leveled out at approximately 125  PT/INR, UA, hepatic panel, and Covid test are all pending  Chest x-ray reviewed by me and confirmed by radiology shows a large pleural effusion in the left lung, radiologist does mention some opacities behind the effusion   EKG is similar to his EKG a month ago which showed A. fib, this EKG does show A. Fib  Dr Cyril Loosenkinner in to see  Pt to be admitted with CHF, NSTEMI, aki, pleural effusion  Raford PitcherJerry R Okelley was evaluated in Emergency Department on 05/27/2020 for the symptoms described in the history of present illness. He was evaluated in the context of the global COVID-19 pandemic, which necessitated consideration that the patient might be at risk for infection with the SARS-CoV-2 virus that causes COVID-19. Institutional protocols and algorithms that pertain to the evaluation of patients at risk for COVID-19 are in a state of rapid change based on information released by regulatory bodies including the CDC and federal and state organizations. These policies and algorithms were followed during the patient's care in the ED.    As part of my medical decision making, I reviewed the following data within the electronic MEDICAL RECORD NUMBER History obtained from family, Nursing notes reviewed and incorporated, Labs reviewed , EKG interpreted atrial fibrillation, Old EKG reviewed, Old chart reviewed, Radiograph reviewed , Discussed with admitting  physician , Evaluated by EM attending , Notes from prior ED visits and [place] Controlled Substance Database  ____________________________________________   FINAL CLINICAL IMPRESSION(S) / ED DIAGNOSES  Final diagnoses:  Pleural effusion  Systolic congestive heart failure, unspecified HF chronicity (HCC)  AKI (acute kidney injury) (HCC)  Persistent atrial fibrillation (HCC)  Shortness of breath      NEW MEDICATIONS STARTED DURING THIS VISIT:  New Prescriptions   No medications on file     Note:  This document was prepared using Dragon voice recognition software and may include unintentional dictation errors.    Faythe Ghee, PA-C 05/27/20 1322    Sherrie Mustache Roselyn Bering, PA-C 05/27/20 1335    Jene Every, MD 05/27/20 1335

## 2020-05-27 NOTE — ED Notes (Signed)
Attending provider at bedside examining pt and discussing plan of care with pt and wife.

## 2020-05-27 NOTE — Consult Note (Addendum)
Cardiology Consult    Patient ID: Robert Frye MRN: [id number], DOB/AGE: 85/03/1931   Admit date: 05/27/2020 Date of Consult: 05/27/2020  Primary Physician: Gracelyn NurseJohnston, John D, MD Primary Cardiologist: Lorine BearsMuhammad Arida, MD Requesting Provider: Birder RobsonM. Eckstat, MD  Patient Profile    Robert Frye is a 87 y.o. male with a history of HFpEF, hypertrophic cardiomyopathy, CKD III, HTN, HL, dementia, and hypothyroidism, who is being seen today for the evaluation of CHF at the request of Dr. Crissie ReeseEckstat.  Past Medical History   Past Medical History:  Diagnosis Date  . (HFpEF) heart failure with preserved ejection fraction (HCC)   . CKD (chronic kidney disease), stage III (HCC)   . Dementia (HCC)   . History of stress test    a. 03/2020 MV: No ischemia/infarct. EF 40% (55-60% by echo 01/2020).  Marland Kitchen. Hyperlipidemia   . Hypertension   . Hypertrophic cardiomyopathy (HCC)    a. 01/2020 Echo: EF 55-60%, no rwma. Sev eccentric LVH of apical and basal-septal segments. gr1 DD. Mildly enlarged RV w/ nl RV fxn. sev dil LA, mod dil RA. Mild MR.  Marland Kitchen. Hypothyroidism   . Persistent atrial fibrillation (HCC)    a. Dx 01/2020 s/p DCCV 02/2020; b. 02/2020 Recurrent Afib noted-->rate controlled-->amio started 05/2020 in setting of ongoing CHF; c. CHA2DS2VASc = 4-->Eliquis 2.5 BID.    Past Surgical History:  Procedure Laterality Date  . CARDIOVERSION N/A 03/14/2020   Procedure: CARDIOVERSION;  Surgeon: Iran OuchArida, Muhammad A, MD;  Location: [facility] ORS;  Service: Cardiovascular;  Laterality: N/A;     Allergies  No Known Allergies  History of Present Illness    87 y/o ? with the above complex PMH including persistent atrial fibrillation, HFpEF, hypertrophic cardiomyopathy, HTN, HL, hypothyroidism, and dementia.  He was previously admitted in 01/2020 in the setting of afib and volume overload w/ mild HsTrop elevation.  Echo showed nl LV fxn @ 55-60% w/ severe eccentric LVH of the apical and basal septal segments, grade 1  diast dysfxn, and sev dil LA.  He was diuresed and initially rate-controlled.  Eliquis 2.5mg  BID was started.  Following discharge and adequate duration of OAC, he underwent DCCV in late December.  Unfortunately, he noted worsening dyspnea and edema w/in a week of DCCV and was found to be back in Afib @ office visit in late December.  Lasix was d/c'd and he was placed on torsemide 20mg  daily.  Stress testing in Jan 2022 was non-ischemic.  EF was reported @ 40%, but as noted, this was nl on echo in Nov.  He required reduction in torsemide (to 10mg ) and metoprolol (to 12.5 bid) doses in late Jan due to hypotension and orthostasis.  At last visit 05/17/20, his wife reported low energy/malaise, and mild wt gain.  Labs notable for sl rise in creat to 1.98.  After discussion w/ Dr. Kirke CorinArida, decision was made to pursue more aggressive rhythm mgmt and he was placed on amiodarone rx.  Metoprolol was also increased back to 25mg  BID.  Unfortunately, he has had progressive decline over the past week.  He has dyspnea w/ minimal exertion and his wife has noted ~ 5 lbs wt gain above his dry wt.  His appetite has been very poor, and so PO intake limited.  He has also had return of lower ext edema.  Due to progressive symptoms, his wife called the office yesterday.  When she didn't get a callback, she decided to bring him into the ED this AM.  Here, ECG  notable for rate-controlled afib.  Labs notable for continued rise in creat to 2.26 w/ AST/ALT elevations of 66/92 respectively.  HsTrop elev @ 218  202.  WBC 12.9.  CXR w/ small to mod L pl effusion and tinry R effusion.  Pt admitted and placed on IV lasix. Wife @ bedside.  Inpatient Medications    . apixaban  2.5 mg Oral BID  . atorvastatin  10 mg Oral Daily  . [START ON 05/28/2020] cholecalciferol  5,000 Units Oral Daily  . furosemide  20 mg Intravenous BID  . [START ON 05/28/2020] levothyroxine  75 mcg Oral Q0600  . potassium chloride  10 mEq Oral Daily  . sodium chloride  flush  3 mL Intravenous Q12H  . cyanocobalamin  1,000 mcg Oral Daily    Family History    Family History  Problem Relation Age of Onset  . Heart attack Father   . Heart disease Sister   . Heart disease Brother    He indicated that the status of his father is unknown. He indicated that the status of his sister is unknown. He indicated that the status of his brother is unknown.   Social History    Social History   Socioeconomic History  . Marital status: Married    Spouse name: Not on file  . Number of children: Not on file  . Years of education: Not on file  . Highest education level: Not on file  Occupational History  . Not on file  Tobacco Use  . Smoking status: Former Games developer  . Smokeless tobacco: Never Used  Vaping Use  . Vaping Use: Never used  Substance and Sexual Activity  . Alcohol use: Not Currently  . Drug use: Never  . Sexual activity: Not on file  Other Topics Concern  . Not on file  Social History Narrative  . Not on file   Social Determinants of Health   Financial Resource Strain: Not on file  Food Insecurity: Not on file  Transportation Needs: Not on file  Physical Activity: Not on file  Stress: Not on file  Social Connections: Not on file  Intimate Partner Violence: Not on file     Review of Systems    General:  +++ generalized malaise.  No chills, fever, night sweats or weight changes.  Cardiovascular:  No chest pain, +++ dyspnea on minimal exertion, +++ inc abd girth, LE edema, and early satiety.  No orthopnea, palpitations, paroxysmal nocturnal dyspnea. Dermatological: No rash, lesions/masses Respiratory: +++ cough, +++ dyspnea Urologic: No hematuria, dysuria Abdominal:   +++ Anorexia/early satiety.  No nausea, vomiting, diarrhea, bright red blood per rectum, melena, or hematemesis Neurologic:  No visual changes, wkns, changes in mental status. All other systems reviewed and are otherwise negative except as noted above.  Physical Exam     Blood pressure 122/89, pulse (!) 119, temperature (!) 97.5 F (36.4 C), temperature source Oral, resp. rate (!) 27, height 5\' 7"  (1.702 m), weight 70.9 kg, SpO2 99 %.  General: Pleasant, NAD Psych: Normal affect. Neuro: Alert and oriented X 3. Moves all extremities spontaneously. HEENT: Normal  Neck: Supple without bruits.  Mod elev JVP. Lungs:  Resp regular and unlabored, CTA. Heart: IR, IR, no s3, s4, or murmurs. Abdomen: semi-firm, protuberant, non-tender, BS + x 4.  Extremities: No clubbing, cyanosis.  2+ bilat LE edema to mid-calf. DP/PT2+, Radials 2+ and equal bilaterally.  Labs    Cardiac Enzymes Recent Labs  Lab 05/27/20 1138 05/27/20 1351  TROPONINIHS  218* 202*      Lab Results  Component Value Date   WBC 12.9 (H) 05/27/2020   HGB 14.8 05/27/2020   HCT 44.4 05/27/2020   MCV 102.1 (H) 05/27/2020   PLT 282 05/27/2020    Recent Labs  Lab 05/27/20 1138  NA 135  K 3.9  CL 100  CO2 23  BUN 49*  CREATININE 2.26*  CALCIUM 9.3  PROT 6.7  BILITOT 3.9*  ALKPHOS 71  ALT 92*  AST 66*  GLUCOSE 123*   Lab Results  Component Value Date   CHOL 139 02/14/2020   HDL 42 02/14/2020   LDLCALC 85 02/14/2020   TRIG 60 02/14/2020     Radiology Studies    DG Chest 2 View  Result Date: 05/27/2020 CLINICAL DATA:  Shortness of breath and weakness. EXAM: CHEST - 2 VIEW COMPARISON:  February 25, 2020 FINDINGS: There is a small right pleural effusion with underlying atelectasis. There is a moderate left effusion with underlying opacity, new in the interval. Stable cardiomegaly. The hila and mediastinum are unremarkable. No pneumothorax. No overt edema. IMPRESSION: 1. New small to moderate left effusion with underlying opacity. The underlying opacity may represent atelectasis. Recommend short-term follow-up imaging to ensure resolution. 2. Tiny right effusion with underlying atelectasis. 3. No other acute abnormalities. Electronically Signed   By: Gerome Sam III M.D   On:  05/27/2020 12:17    ECG & Cardiac Imaging    Afib, 77, LAD, LVH - personally reviewed.  Assessment & Plan    1. Acute on chronic HFpEF/Hypertrophic CM:  Dx in 01/2020 in the setting of rapid Afib.  EF 55-60% @ that time w/ sev apical and basal hypertrophy.  Volume mgmt in the setting of ongoing afib has been difficult in the outpt setting.  Initially was on oral lasix but has been on torsemide since late Dec 2021.  Dosing limited to 10mg  daily by hypotension/orthostasis along with worsening creat - up to 1.98 on 2/23.  Amio recently added to regimen in effort @ rhythm mgmt given contribution of afib to CHF.  Unfortunately, he has had continued decline w/ dyspnea on minimal exertion, malaise, anorexia (preceded amio)/early satiety, wt gain, and increasing abd girth/lower ext edema, prompting his wife to bring him to the ED today.  He is volume overloaded on examination.  Labs notable for rising creat @ 2.26. Bicarb is nl though. LFTs elevated.  Clinical picture is that of low output.  Marius Betts repeat limited echo to re-eval EF.  Cont IV diuresis and escalate to 40 IV BID.  Follow renal fxn closely.    2.  Persistent Atrial Fibrillation:  Dx in 01/2020 w/ DCCV in 02/2020.  Unfortunately, he had recurrent Afib w/in a week and has been rate-controlled since.  Amio started 2/24 due to ongoing afib and volume excess w/ plan for DCCV after loading.  Rate controlled today @ 77.   blocker and amio currently held.  If LFTs improve w/ diuresis, would plan to resume both in AM.  Cont reduced dose eliquis in setting of advanced age and elev creat.  Following diuresis, provided LFTs improve, can consider DCCV on amio prior to d/c.  3.  Elevated HsTroponin/Demand ischemia:  In setting of #1, HsT elevated @ 218  202.  Prev neg MV in 03/2020. No chest pain.  Suspect demand ischemia in the setting of volume overload.  No plan for ischemic eval @ this time.  Hold statin given rise in LFTs.  No asa  in setting of eliquis.    blocker currently on hold due to concern for low output.  4.  Essential HTN:  Stable.  5.  Transaminitis:  AST 66, ALT 92 w/ T bili 3.9.  Amio just started 2/24, so unlikely to be 2/2 amio toxicity but reasonable to hold tonight.  Suspect volume/passive congestion contributing to LFT rise.  Further w/u planned per IM.  6.  Acute on chronic stage III kidney dzs:  Creat prev 1.3-1.4 in Nov, but trending 1.6-1.7 since.  Was 1.98 2/23 and now 2.26.  Follow w/ diuresis.  7.  HL:  Hold atorvastatin in setting of elevated LFTs.  8.  Pleural Effusion:  Small to mod L and tiny right pl effusions in the setting of #1.  Follow w/ diuresis.  Signed, Nicolasa Ducking, NP 05/27/2020, 3:41 PM  For questions or updates, please contact   Please consult [email or web address] for contact info under Cardiology/STEMI.    I have seen and examined this patient with Ward Givens.  Agree with above, note added to reflect my findings.  On exam, irregular, no murmurs.   Patient presented with diastolic HF. Creatinine elevated, LFTS elevated. Has been diuresed. Now with improved creatinine and LFTs. Would continue diuresis as these number continue to improve. Litisha Guagliardo get limited echo to determine EF. Has atrial fibrillation as well. Would continue anticoagulation and amiodarone. If he diureses well, may consider DCCV during this admission.  Lynore Coscia M. Esker Dever MD 05/28/2020 10:51 AM

## 2020-05-27 NOTE — H&P (Addendum)
[facility] History and Physical  Robert Frye YCX:[id number] DOB: 08-29-1931 DOA: 05/27/2020  Referring physician: Dr. Cyril Loosen PCP: Gracelyn Nurse, MD   Chief Complaint: shortness of breath  HPI: Robert Frye is a 87 y.o. male with history of heart failure with preserved ejection fraction, persistent A. fib, persistent troponinemia, hypertension, CKD, hypothyroidism, who presents with shortness of breath.  Review of chart shows patient has been seeing cardiology frequently over the past several months.  Last echo done November 2021 and showed EF 55 to 60%, no regional wall motion abnormalities, and severe LVH with interventricular septal measurement of 9 mm.  Had stress test in January 2022 which showed EF of 40% at that time without any evidence of ischemia.  Last seen the week prior in cardiology clinic, at that time reporting significant dyspnea on exertion with plan to perform cardiac MRI and possible left and right heart cath in the near future.  Today history very difficult to obtain due to the patient's mild dementia as well as him being hard of hearing.  Discussed symptoms with wife and him in detail, she reports that since her last cardiology visit he has gotten progressively weaker and more short of breath.  He is unable to get up to go to the bathroom and even take a few steps without becoming very short of breath and unsteady.  He has not reported any chest pain or belly pain over this time.  However he has had some intermittent pain in his jaw, last occurred yesterday.  Wife reports he has been taking all his medications as prescribed as well as amiodarone which is new as of last week.  In the ED initial vital signs notable only for O2 sat in the low 90s on room air.  Lab work-up notable for increasing creatinine since last week from 1.9 to 2.2 (baseline 1.4), bump in LFTs with AST 66 and ALT 92 (previously normal), direct bili 1.4 and indirect bili 2.5 (T bili previously  1.9), troponin 218 (baseline around 120s), CBC at baseline, INR 2.4, Covid and flu test negative.  Chest x-ray was obtained which showed new left-sided pleural effusion.  EKG was obtained which showed no changes compared to priors.  He was admitted for further management of CHF exacerbation.  Review of Systems:  Pertinent positives and negative per HPI, all others reviewed and negative  Past Medical History:  Diagnosis Date  . Arrhythmia    paroxysmal atrial fibrillation  . Atrial fibrillation (HCC)   . CHF (congestive heart failure) (HCC)   . Dementia (HCC)   . Hyperlipidemia   . Hypertension   . Renal insufficiency   . Thyroid disease    Past Surgical History:  Procedure Laterality Date  . CARDIOVERSION N/A 03/14/2020   Procedure: CARDIOVERSION;  Surgeon: Iran Ouch, MD;  Location: [facility] ORS;  Service: Cardiovascular;  Laterality: N/A;   Social History:  reports that he has quit smoking. He has never used smokeless tobacco. He reports previous alcohol use. He reports that he does not use drugs.  No Known Allergies  Family History  Problem Relation Age of Onset  . Heart attack Father   . Heart disease Sister   . Heart disease Brother      Prior to Admission medications   Medication Sig Start Date End Date Taking? Authorizing Provider  amiodarone (PACERONE) 200 MG tablet Take 1 tablet (200 mg total) by mouth 2 (two) times daily. 05/18/20 11/14/20  Marisue Ivan D, PA-C  apixaban (ELIQUIS) 2.5 MG TABS tablet Take 1 tablet (2.5 mg total) by mouth 2 (two) times daily. 03/07/20 03/02/21  Marisue Ivan D, PA-C  atorvastatin (LIPITOR) 10 MG tablet Take 10 mg by mouth daily. 11/17/14   [provider]  Cholecalciferol (DIALYVITE VITAMIN D 5000) 125 MCG (5000 UT) capsule Take 5,000 Units by mouth daily.    [provider]  cyanocobalamin 1000 MCG tablet Take 1,000 mcg by mouth daily.    [provider]  levothyroxine (SYNTHROID) 75 MCG tablet  Take 75 mcg by mouth daily before breakfast. 11/12/14   [provider]  metoprolol tartrate (LOPRESSOR) 25 MG tablet Take 0.5 tablets (12.5 mg total) by mouth 2 (two) times daily. 04/13/20 04/08/21  Marisue Ivan D, PA-C  Naphazoline HCl (CLEAR EYES OP) Place 1 drop into both eyes daily as needed (dry/itchy eyes).    [provider]  potassium chloride SA (KLOR-CON) 10 MEQ tablet Take 1 tablet (10 mEq total) by mouth daily. Patient taking differently: Take 5 mEq by mouth daily. 04/13/20   Marisue Ivan D, PA-C  torsemide (DEMADEX) 10 MG tablet Take 1 tablet (10 mg total) by mouth daily. 04/13/20 07/12/20  Marisue Ivan D, PA-C   Physical Exam: Vitals:   05/27/20 1134 05/27/20 1139 05/27/20 1225  BP:  114/74 138/78  Pulse:  77 (!) 39  Resp:  16 (!) 21  Temp:  (!) 97.5 F (36.4 C)   TempSrc:  Oral   SpO2:  100% 92%  Weight: 70.9 kg    Height: 5\' 7"  (1.702 m)      Wt Readings from Last 3 Encounters:  05/27/20 70.9 kg  05/17/20 71.7 kg  04/13/20 68 kg     . General:  Appears calm and comfortable . Eyes: PERRL, normal lids, irises & conjunctiva . ENT: hard of hearing . Neck: no masses . Cardiovascular: irregular, no m/r/g. JVD appears elevated. 2+ pitting edem bilaterally to theknee . Telemetry: irregular, appears to be AFib . Respiratory: CTA bilaterally, no w/r/r. Normal respiratory effort. . Abdomen: soft, ntnd . Skin: no rash or induration seen on limited exam . Musculoskeletal: grossly normal tone BUE/BLE . Psychiatric: grossly normal mood and affect, speech fluent and appropriate . Neurologic: grossly non-focal.          Labs on Admission:  Basic Metabolic Panel: Recent Labs  Lab 05/27/20 1138  NA 135  K 3.9  CL 100  CO2 23  GLUCOSE 123*  BUN 49*  CREATININE 2.26*  CALCIUM 9.3   Liver Function Tests: Recent Labs  Lab 05/27/20 1138  AST 66*  ALT 92*  ALKPHOS 71  BILITOT 3.9*  PROT 6.7  ALBUMIN 3.9   No results for input(s):  LIPASE, AMYLASE in the last 168 hours. No results for input(s): AMMONIA in the last 168 hours. CBC: Recent Labs  Lab 05/27/20 1138  WBC 12.9*  HGB 14.8  HCT 44.4  MCV 102.1*  PLT 282   Cardiac Enzymes: No results for input(s): CKTOTAL, CKMB, CKMBINDEX, TROPONINI in the last 168 hours.  BNP (last 3 results) Recent Labs    02/13/20 0735 02/25/20 0919  BNP 1,073.4* 1,206.6*    ProBNP (last 3 results) No results for input(s): PROBNP in the last 8760 hours.  CBG: No results for input(s): GLUCAP in the last 168 hours.  Radiological Exams on Admission: DG Chest 2 View  Result Date: 05/27/2020 CLINICAL DATA:  Shortness of breath and weakness. EXAM: CHEST - 2 VIEW COMPARISON:  February 25, 2020 FINDINGS: There is a small right pleural effusion with underlying atelectasis. There is a moderate left effusion with underlying opacity, new in the interval. Stable cardiomegaly. The hila and mediastinum are unremarkable. No pneumothorax. No overt edema. IMPRESSION: 1. New small to moderate left effusion with underlying opacity. The underlying opacity may represent atelectasis. Recommend short-term follow-up imaging to ensure resolution. 2. Tiny right effusion with underlying atelectasis. 3. No other acute abnormalities. Electronically Signed   By: Gerome Sam III M.D   On: 05/27/2020 12:17   Chest XR: Independently reviewed.  Moderate left-sided pleural effusion as well as blunting of the right costophrenic angle consistent with small effusion.  Right atrial dilation.  Vascular congestion consistent with volume overload.  EKG: Independently reviewed.  A. fib versus flutter with possible 2-1 conduction, T wave inversions in anterior and lateral leads and Q waves in lead III which are all unchanged from prior.  Assessment/Plan Active Problems:   Paroxysmal atrial fibrillation (HCC)   Elevated troponin   Essential hypertension   HLD (hyperlipidemia)   CKD (chronic kidney disease), stage  IIIa   Acquired thrombophilia (HCC)   Acute on chronic systolic (congestive) heart failure (HCC)   #Acute on chronic heart failure with reduced ejection fraction #Hypertrophic cardiomyopathy #Cardiorenal syndrome #L pleural effusion #AFib Patient presenting with signs of volume overload.  Unclear precipitant, though may be driven by his A. fib though he is not currently in RVR.  Creatinine is elevated as well, suspect cardiorenal syndrome in setting of poor forward flow but will need to diuresis with caution, plan to consult cardiology for assistance.  Given prior findings of both heart failure and CKD, will initiate limited work-up for possible infiltrative diseases. -Consult heart failure service -Lasix 20 mg IV twice daily -Daily weights -Strict I/O -Check iron, ferritin, UPEP, SPEP, SIFE -Keep K above 4, mag above 2 -Hold amiodarone, see below -Hold metoprolol in setting of acute exacerbation -Continue apixaban, atorvastatin, potassium supplement  #Elevated troponin EKG unchanged from prior and patient without symptoms with exception of some intermittent jaw pain.  Suspect this is all due to demand in the setting of his volume overload as well as his AKI. -Trend troponin  #Transaminitis #Hyperbilirubinemia Transaminitis likely secondary to recent initiation of amiodarone but hyperbilirubinemia is unexpected.  Hold amiodarone, will obtain acute hepatitis panel and abdominal ultrasound. -Follow-up acute hepatitis panel -Follow-up abdominal ultrasound  #Known medical problems Hypothyroidism-continue Synthroid   Code Status: DNR/DNI, confirmed with patient and wife at bedside after discussion DVT Prophylaxis: On therapeutic anticoagulation Family Communication: Wife updated at bedside Disposition Plan: Inpatient, Progressive Cardiac   Time spent: 16 min  Venora Maples MD/MPH [facility]

## 2020-05-27 NOTE — ED Triage Notes (Addendum)
Patient presents with sob and weakness. Wife reports he is not ambulating well like usual. Reports he cannot walk short distances without SOB. Wife also reports he is not urinating much and abdomen is distended. HX dementia. Patient denies pain. Recently placed on medications for A-fib. Patient has no energy. Falling asleep in wheelchair and wife reports that is abnormal for him.

## 2020-05-28 DIAGNOSIS — I5023 Acute on chronic systolic (congestive) heart failure: Secondary | ICD-10-CM

## 2020-05-28 LAB — HEPATITIS PANEL, ACUTE
HCV Ab: NONREACTIVE
Hep A IgM: NONREACTIVE
Hep B C IgM: NONREACTIVE
Hepatitis B Surface Ag: NONREACTIVE

## 2020-05-28 LAB — BASIC METABOLIC PANEL
Anion gap: 12 (ref 5–15)
BUN: 44 mg/dL — ABNORMAL HIGH (ref 8–23)
CO2: 22 mmol/L (ref 22–32)
Calcium: 8.8 mg/dL — ABNORMAL LOW (ref 8.9–10.3)
Chloride: 104 mmol/L (ref 98–111)
Creatinine, Ser: 2.01 mg/dL — ABNORMAL HIGH (ref 0.61–1.24)
GFR, Estimated: 32 mL/min — ABNORMAL LOW (ref >60)
Glucose, Bld: 95 mg/dL (ref 70–99)
Potassium: 3.4 mmol/L — ABNORMAL LOW (ref 3.5–5.1)
Sodium: 138 mmol/L (ref 135–145)

## 2020-05-28 LAB — HEPATIC FUNCTION PANEL
ALT: 70 U/L — ABNORMAL HIGH (ref 0–44)
AST: 42 U/L — ABNORMAL HIGH (ref 15–41)
Albumin: 3.5 g/dL (ref 3.5–5.0)
Alkaline Phosphatase: 64 U/L (ref 38–126)
Bilirubin, Direct: 1.4 mg/dL — ABNORMAL HIGH (ref 0.0–0.2)
Indirect Bilirubin: 2.1 mg/dL — ABNORMAL HIGH (ref 0.3–0.9)
Total Bilirubin: 3.5 mg/dL — ABNORMAL HIGH (ref 0.3–1.2)
Total Protein: 6.4 g/dL — ABNORMAL LOW (ref 6.5–8.1)

## 2020-05-28 LAB — MAGNESIUM: Magnesium: 2.5 mg/dL — ABNORMAL HIGH (ref 1.7–2.4)

## 2020-05-28 MED ORDER — AMIODARONE HCL 200 MG PO TABS
200.0000 mg | ORAL_TABLET | Freq: Two times a day (BID) | ORAL | Status: DC
  Administered 2020-05-28 – 2020-05-29 (×3): 200 mg via ORAL
  Filled 2020-05-28 (×3): qty 1

## 2020-05-28 MED ORDER — POTASSIUM CHLORIDE 20 MEQ PO PACK
40.0000 meq | PACK | Freq: Every day | ORAL | Status: DC
  Administered 2020-05-28 – 2020-05-29 (×2): 40 meq via ORAL
  Filled 2020-05-28 (×2): qty 2

## 2020-05-28 MED ORDER — METOPROLOL TARTRATE 25 MG PO TABS: 12.5000 mg | ORAL_TABLET | Freq: Two times a day (BID) | ORAL | Status: DC

## 2020-05-28 NOTE — Progress Notes (Signed)
ANTICOAGULATION CONSULT NOTE  Pharmacy Consult for apixaban dosing Indication: atrial fibrillation  No Known Allergies  Patient Measurements: Height: 5\' 7"  (170.2 cm) Weight: 68 kg (150 lb) IBW/kg (Calculated) : 66.1  Vital Signs: Temp: 97.8 F (36.6 C) (03/06 0443) Temp Source: Oral (03/06 0443) BP: 111/93 (03/06 0523) Pulse Rate: 96 (03/06 0523)  Labs: Recent Labs    05/27/20 1138 05/27/20 1351 05/28/20 0532  HGB 14.8  --   --   HCT 44.4  --   --   PLT 282  --   --   LABPROT 24.9*  --   --   INR 2.4*  --   --   CREATININE 2.26*  --  2.01*  TROPONINIHS 218* 202*  --     Estimated Creatinine Clearance: 24.2 mL/min (A) (by C-G formula based on SCr of 2.01 mg/dL (H)).   Medical History: Past Medical History:  Diagnosis Date  . (HFpEF) heart failure with preserved ejection fraction (HCC)   . CKD (chronic kidney disease), stage III (HCC)   . Dementia (HCC)   . History of stress test    a. 03/2020 MV: No ischemia/infarct. EF 40% (55-60% by echo 01/2020).  02/2020 Hyperlipidemia   . Hypertension   . Hypertrophic cardiomyopathy (HCC)    a. 01/2020 Echo: EF 55-60%, no rwma. Sev eccentric LVH of apical and basal-septal segments. gr1 DD. Mildly enlarged RV w/ nl RV fxn. sev dil LA, mod dil RA. Mild MR.  02/2020 Hypothyroidism   . Persistent atrial fibrillation (HCC)    a. Dx 01/2020 s/p DCCV 02/2020; b. 02/2020 Recurrent Afib noted-->rate controlled-->amio started 05/2020 in setting of ongoing CHF; c. CHA2DS2VASc = 4-->Eliquis 2.5 BID.    Medications:  Scheduled:  . apixaban  2.5 mg Oral BID  . cholecalciferol  5,000 Units Oral Daily  . furosemide  40 mg Intravenous BID  . levothyroxine  75 mcg Oral Q0600  . potassium chloride  10 mEq Oral Daily  . potassium chloride  40 mEq Oral Daily  . sodium chloride flush  3 mL Intravenous Q12H  . cyanocobalamin  1,000 mcg Oral Daily    Assessment: 87 y.o. male with history of heart failure with preserved ejection fraction, persistent  A. fib, persistent troponinemia, hypertension, CKD, hypothyroidism, who presents with shortness of breath. He was on apixaban 2.5 mg BID prior to arrival. He meets 2/3 dose reduction criteria (Serum creatinine ?1.5 mg/dL and ?87 years of age). H&H, platelets wnl  Goal of Therapy:  Monitor platelets by anticoagulation protocol: Yes   Plan:   Continue apixaban 2.5 mg po BID  CBC at least every 3 days per protocol  96 05/28/2020,7:33 AM

## 2020-05-28 NOTE — Evaluation (Signed)
Physical Therapy Evaluation Patient Details Name: Robert Frye MRN: [id number] DOB: 1931-04-06 Today's Date: 05/28/2020   History of Present Illness  Pt is an 87 y/o M admitted on 05/27/20 with c/c of SOB. Pt admitted for tx of CHF exacerbation. PMH: heart failure with preserved EF, persistent A-fib, persistent troponinemia, HTN, CKD, hypothyroidism, dementia, HOH  Clinical Impression  Pt seen for PT evaluation with communication negatively impacted by [facility] with wife assisting throughout. Pt noted to have elevated HR at rest (125 bpm) but MD cleared pt for limited participation in PT based on pt's tolerance. Pt is able to complete bed mobility with use of hospital bed features but does require min assist for stand pivot to recliner 2/2 unsteadiness on feet. HR up to 127 bpm after transferring. Pt does endorse feeling "woozy" upon sitting EOB that does decline but notes it again after transferring to recliner; BP = 105/60 (in LLE) - nurse & MD notified of pt's c/o. Functional mobility limited 2/2 elevated HR but anticipate pt can ambulate with minimal assistance but would benefit from RW for increased balance. Will attempt to see pt tomorrow to progress ambulation with AD as needed. At this time, recommending HHPT f/u but will f/u on this during tomorrow's session.   SpO2 >90% throughout on 2L/min, no c/o SOB    Follow Up Recommendations Home health PT;Supervision for mobility/OOB    Equipment Recommendations  Rolling walker with 5" wheels;3in1 (PT)    Recommendations for Other Services       Precautions / Restrictions Precautions Precautions: Fall Restrictions Weight Bearing Restrictions: No      Mobility  Bed Mobility Overal bed mobility: Modified Independent             General bed mobility comments: supine>sit with HOB elevated & use of bed rails    Transfers Overall transfer level: Needs assistance   Transfers: Sit to/from Stand;Stand Pivot Transfers Sit to Stand: Min  assist Stand pivot transfers: Min assist       General transfer comment: decreased balance & unsteadiness with pt reaching for UE support during stand pivot bed>recliner  Ambulation/Gait                Stairs            Wheelchair Mobility    Modified Rankin (Stroke Patients Only)       Balance Overall balance assessment: Needs assistance Sitting-balance support: Feet supported;No upper extremity supported Sitting balance-Leahy Scale: Good Sitting balance - Comments: supervision static sitting EOB   Standing balance support: Bilateral upper extremity supported;During functional activity Standing balance-Leahy Scale: Fair                               Pertinent Vitals/Pain Pain Assessment: No/denies pain    Home Living Family/patient expects to be discharged to:: Private residence Living Arrangements: Spouse/significant other Available Help at Discharge: Family;Available 24 hours/day Type of Home: House Home Access: Stairs to enter Entrance Stairs-Rails: Left Entrance Stairs-Number of Steps: 3 Home Layout: One level        Prior Function Level of Independence: Independent         Comments: without AD, denies falls in past 6 months     Hand Dominance   Dominant Hand: Right    Extremity/Trunk Assessment   Upper Extremity Assessment Upper Extremity Assessment: Generalized weakness    Lower Extremity Assessment Lower Extremity Assessment: Generalized weakness  Communication   Communication: HOH (hearing aide in R ear)  Cognition Arousal/Alertness: Awake/alert Behavior During Therapy: WFL for tasks assessed/performed Overall Cognitive Status: Difficult to assess                                        General Comments General comments (skin integrity, edema, etc.): Educated pt on ability to perform seated LAQ & hip flexion while in hospital to maintain strength.    Exercises     Assessment/Plan     PT Assessment Patient needs continued PT services  PT Problem List Decreased strength;Decreased balance;Cardiopulmonary status limiting activity;Decreased knowledge of use of DME;Decreased mobility;Decreased activity tolerance;Decreased safety awareness       PT Treatment Interventions DME instruction;Functional mobility training;Balance training;Patient/family education;Wheelchair mobility training;Gait training;Therapeutic activities;Stair training;Therapeutic exercise;Cognitive remediation;Manual techniques    PT Goals (Current goals can be found in the Care Plan section)  Acute Rehab PT Goals Patient Stated Goal: get better PT Goal Formulation: With patient/family Time For Goal Achievement: 06/11/20 Potential to Achieve Goals: Good    Frequency Min 2X/week   Barriers to discharge        Co-evaluation               AM-PAC PT "6 Clicks" Mobility  Outcome Measure Help needed turning from your back to your side while in a flat bed without using bedrails?: None Help needed moving from lying on your back to sitting on the side of a flat bed without using bedrails?: None Help needed moving to and from a bed to a chair (including a wheelchair)?: A Little Help needed standing up from a chair using your arms (e.g., wheelchair or bedside chair)?: A Little Help needed to walk in hospital room?: A Little Help needed climbing 3-5 steps with a railing? : A Lot 6 Click Score: 19    End of Session Equipment Utilized During Treatment: Oxygen Activity Tolerance: Treatment limited secondary to medical complications (Comment) Patient left: in chair;with call bell/phone within reach;with chair alarm set;with family/visitor present Nurse Communication: Mobility status (HR) PT Visit Diagnosis: Muscle weakness (generalized) (M62.81);Difficulty in walking, not elsewhere classified (R26.2);Unsteadiness on feet (R26.81)    Time: 2334-3568 PT Time Calculation (min) (ACUTE ONLY): 16  min   Charges:   PT Evaluation $PT Eval Low Complexity: 1 Low          Aleda Grana, PT, DPT 05/28/20, 2:16 PM   Sandi Mariscal 05/28/2020, 2:12 PM

## 2020-05-28 NOTE — Progress Notes (Signed)
PROGRESS NOTE    Robert Frye  BPZ:[id number] DOB: 05/25/1931 DOA: 05/27/2020 PCP: Gracelyn Nurse, MD  Outpatient Specialists: [facility] [place]    Brief Narrative:   Admitted for CHF exacerbation   Assessment & Plan:   Active Problems:   Paroxysmal atrial fibrillation (HCC)   Elevated troponin   Essential hypertension   HLD (hyperlipidemia)   CKD (chronic kidney disease), stage IIIa   Acquired thrombophilia (HCC)   Acute on chronic systolic (congestive) heart failure (HCC)  # Acute on chronic heart failure with reduced ejection fraction # Hypertrophic cardiomyopathy # Cardiorenal syndrome # L pleural effusion # AFib with RVR # Acute hypoxic respiratory failure # Tropinemia Recent dx of chf in setting of a fib. Failed dccv. Started on amio last month. Negative stress test recently. Here w/ dyspnea, found with elevated bnp, new pleural effusion, likely cardiorenal syndrome, new mild O2 requirement. Amio and BB held overnight w/ concern for poss amio toxicity (cardiology thinks unlikely) and possible worsened EF. Cr improved today w/ diuresis. Demand the likely culprit for mild tropinemia. HR in 120s this morning of amio and BB - continue lasix 40 IV bid - TTE pending - likely resumption of BB and amio today, LFTs pending - monitor I/Os and daily weights - [place] O2, wean as able - consider thoracentesis if respiratory picture doesn't improve w/ diuresis - cont eliquis  # Transaminitis # Hyperbilirubinemia Likely congestive hepatopathy. RUQ u/s unremarkable - trend LFTs - f/u hepatitis panel - statin and amio on hold  # HTN Controlled - monitor  # AKI on CKD 3 Baseline cr 1.4, here 2.26 on presentation, improved to 2.01 w/ diuresis - cont diuresis and monitor  #Transaminitis #Hyperbilirubinemia Transaminitis likely secondary to recent initiation of amiodarone but hyperbilirubinemia is unexpected.  Hold amiodarone, will obtain acute hepatitis panel and  abdominal ultrasound. -Follow-up acute hepatitis panel -Follow-up abdominal ultrasound  # Hypokalemia K 3.4, likely 2/2 diuretics - start k 40 qd - f/u mg  # Hypothyroid TSH wnl - cont home levo  # Dementia # Debility - pt/ot consulted  DVT prophylaxis: eliquis Code Status: dnr Family Communication: wife updated bedside 3/6  Level of care: Progressive Cardiac Status is: Inpatient  Remains inpatient appropriate because:Inpatient level of care appropriate due to severity of illness   Dispo: The patient is from: Home              Anticipated d/c is to: tbd              Patient currently is not medically stable to d/c.   Difficult to place patient No        Consultants:  cardiology  Procedures: none  Antimicrobials:  none    Subjective: Overnight frequent urination. Wife says more confused than normal, not agitated. No reported dyspnea or chest pain  Objective: Vitals:   05/28/20 0148 05/28/20 0443 05/28/20 0504 05/28/20 0523  BP: 125/90 95/71  (!) 111/93  Pulse: 61 (!) 122  96  Resp: 18     Temp: 98.3 F (36.8 C) 97.8 F (36.6 C)    TempSrc: Oral Oral    SpO2: 96% 96%  95%  Weight:   68 kg   Height:        Intake/Output Summary (Last 24 hours) at 05/28/2020 0735 Last data filed at 05/28/2020 0500 Gross per 24 hour  Intake 3 ml  Output 660 ml  Net -657 ml   Filed Weights   05/27/20 1134 05/27/20 1633 05/28/20  0504  Weight: 70.9 kg 72.9 kg 68 kg    Examination:  General exam: sleeping comfortably Respiratory system: faint rales at bases Cardiovascular system: S1 & S2 heard, irreg irreg, tachycardic, soft systolic murmur. Trace LE edema Gastrointestinal system: Abdomen is nondistended, soft and nontender. No organomegaly or masses felt. Normal bowel sounds heard. Central nervous system: asleep, somewhat arousable Extremities: Symmetric 5 x 5 power. Skin: No rashes, lesions or ulcers Psychiatry: unable to assess    Data Reviewed: I have  personally reviewed following labs and imaging studies  CBC: Recent Labs  Lab 05/27/20 1138  WBC 12.9*  HGB 14.8  HCT 44.4  MCV 102.1*  PLT 282   Basic Metabolic Panel: Recent Labs  Lab 05/27/20 1138 05/28/20 0532  NA 135 138  K 3.9 3.4*  CL 100 104  CO2 23 22  GLUCOSE 123* 95  BUN 49* 44*  CREATININE 2.26* 2.01*  CALCIUM 9.3 8.8*   GFR: Estimated Creatinine Clearance: 24.2 mL/min (A) (by C-G formula based on SCr of 2.01 mg/dL (H)). Liver Function Tests: Recent Labs  Lab 05/27/20 1138  AST 66*  ALT 92*  ALKPHOS 71  BILITOT 3.9*  PROT 6.7  ALBUMIN 3.9   No results for input(s): LIPASE, AMYLASE in the last 168 hours. No results for input(s): AMMONIA in the last 168 hours. Coagulation Profile: Recent Labs  Lab 05/27/20 1138  INR 2.4*   Cardiac Enzymes: No results for input(s): CKTOTAL, CKMB, CKMBINDEX, TROPONINI in the last 168 hours. BNP (last 3 results) No results for input(s): PROBNP in the last 8760 hours. HbA1C: No results for input(s): HGBA1C in the last 72 hours. CBG: No results for input(s): GLUCAP in the last 168 hours. Lipid Profile: No results for input(s): CHOL, HDL, LDLCALC, TRIG, CHOLHDL, LDLDIRECT in the last 72 hours. Thyroid Function Tests: Recent Labs    05/27/20 1147  TSH 3.088   Anemia Panel: Recent Labs    05/27/20 1615  FERRITIN 159  TIBC 367  IRON 66   Urine analysis: No results found for: COLORURINE, APPEARANCEUR, LABSPEC, PHURINE, GLUCOSEU, HGBUR, BILIRUBINUR, KETONESUR, PROTEINUR, UROBILINOGEN, NITRITE, LEUKOCYTESUR Sepsis Labs: @LABRCNTIP (procalcitonin:4,lacticidven:4)  ) Recent Results (from the past 240 hour(s))  Resp Panel by RT-PCR (Flu A&B, Covid) Nasopharyngeal Swab     Status: None   Collection Time: 05/27/20 12:38 PM   Specimen: Nasopharyngeal Swab; Nasopharyngeal(NP) swabs in vial transport medium  Result Value Ref Range Status   SARS Coronavirus 2 by RT PCR NEGATIVE NEGATIVE Final    Comment:  (NOTE) SARS-CoV-2 target nucleic acids are NOT DETECTED.  The SARS-CoV-2 RNA is generally detectable in upper respiratory specimens during the acute phase of infection. The lowest concentration of SARS-CoV-2 viral copies this assay can detect is 138 copies/mL. A negative result does not preclude SARS-Cov-2 infection and should not be used as the sole basis for treatment or other patient management decisions. A negative result may occur with  improper specimen collection/handling, submission of specimen other than nasopharyngeal swab, presence of viral mutation(s) within the areas targeted by this assay, and inadequate number of viral copies(<138 copies/mL). A negative result must be combined with clinical observations, patient history, and epidemiological information. The expected result is Negative.  Fact Sheet for Patients:  07/27/20  Fact Sheet for Healthcare Providers:  BloggerCourse.com  This test is no t yet approved or cleared by the [place] FDA and  has been authorized for detection and/or diagnosis of SARS-CoV-2 by FDA under an Emergency Use Authorization (EUA). This EUA  will remain  in effect (meaning this test can be used) for the duration of the COVID-19 declaration under Section 564(b)(1) of the Act, 21 U.S.C.section 360bbb-3(b)(1), unless the authorization is terminated  or revoked sooner.       Influenza A by PCR NEGATIVE NEGATIVE Final   Influenza B by PCR NEGATIVE NEGATIVE Final    Comment: (NOTE) The Xpert Xpress SARS-CoV-2/FLU/RSV plus assay is intended as an aid in the diagnosis of influenza from Nasopharyngeal swab specimens and should not be used as a sole basis for treatment. Nasal washings and aspirates are unacceptable for Xpert Xpress SARS-CoV-2/FLU/RSV testing.  Fact Sheet for Patients: BloggerCourse.com  Fact Sheet for Healthcare  Providers: SeriousBroker.it  This test is not yet approved or cleared by the [place] FDA and has been authorized for detection and/or diagnosis of SARS-CoV-2 by FDA under an Emergency Use Authorization (EUA). This EUA will remain in effect (meaning this test can be used) for the duration of the COVID-19 declaration under Section 564(b)(1) of the Act, 21 U.S.C. section 360bbb-3(b)(1), unless the authorization is terminated or revoked.  Performed at [facility], [address] [place], [place] [address]          Radiology Studies: DG Chest 2 View  Result Date: 05/27/2020 CLINICAL DATA:  Shortness of breath and weakness. EXAM: CHEST - 2 VIEW COMPARISON:  February 25, 2020 FINDINGS: There is a small right pleural effusion with underlying atelectasis. There is a moderate left effusion with underlying opacity, new in the interval. Stable cardiomegaly. The hila and mediastinum are unremarkable. No pneumothorax. No overt edema. IMPRESSION: 1. New small to moderate left effusion with underlying opacity. The underlying opacity may represent atelectasis. Recommend short-term follow-up imaging to ensure resolution. 2. Tiny right effusion with underlying atelectasis. 3. No other acute abnormalities. Electronically Signed   By: Gerome Sam III M.D   On: 05/27/2020 12:17   US Abdomen Limited RUQ (LIVER/GB)  Result Date: 05/27/2020 CLINICAL DATA:  Transaminitis EXAM: ULTRASOUND ABDOMEN LIMITED RIGHT UPPER QUADRANT COMPARISON:  None. FINDINGS: Gallbladder: No gallstones or wall thickening visualized. No sonographic Murphy sign noted by sonographer. Common bile duct: Diameter: 2.9 mm Liver: No focal lesion identified. Within normal limits in parenchymal echogenicity. Portal vein is patent on color Doppler imaging with normal direction of blood flow towards the liver. Other: None. IMPRESSION: 1. Right pleural effusion. 2. The gallbladder, common bile duct, and  liver are unremarkable. Electronically Signed   By: Gerome Sam III M.D   On: 05/27/2020 15:39        Scheduled Meds: . apixaban  2.5 mg Oral BID  . cholecalciferol  5,000 Units Oral Daily  . furosemide  40 mg Intravenous BID  . levothyroxine  75 mcg Oral Q0600  . potassium chloride  10 mEq Oral Daily  . sodium chloride flush  3 mL Intravenous Q12H  . cyanocobalamin  1,000 mcg Oral Daily   Continuous Infusions: . sodium chloride       LOS: 1 day    Time spent: 45 min    Silvano Bilis, MD [facility]   If 7PM-7AM, please contact night-coverage [email or web address] Password [facility] 05/28/2020, 7:35 AM

## 2020-05-28 NOTE — Plan of Care (Signed)
Alert, oriented to person and hospital only. Calm and cooperative, hard of hearing, wife at bedside. On A-fib, rate between 70 to 120's, on 2 LPM nasal canula, O2 saturation at 90's. Plan of care done as ordered. Call light within reach.

## 2020-05-29 ENCOUNTER — Inpatient Hospital Stay (HOSPITAL_COMMUNITY)
Admit: 2020-05-29 | Discharge: 2020-05-29 | Disposition: A | Discharge status: Home / Self Care | Payer: Medicare Other | Attending: Nurse Practitioner | Admitting: Nurse Practitioner

## 2020-05-29 ENCOUNTER — Ambulatory Visit: Payer: Medicare Other | Admitting: Physician Assistant

## 2020-05-29 DIAGNOSIS — I480 Paroxysmal atrial fibrillation: Secondary | ICD-10-CM

## 2020-05-29 DIAGNOSIS — R0600 Dyspnea, unspecified: Secondary | ICD-10-CM

## 2020-05-29 LAB — BASIC METABOLIC PANEL
Anion gap: 12 (ref 5–15)
BUN: 35 mg/dL — ABNORMAL HIGH (ref 8–23)
CO2: 28 mmol/L (ref 22–32)
Calcium: 8.8 mg/dL — ABNORMAL LOW (ref 8.9–10.3)
Chloride: 101 mmol/L (ref 98–111)
Creatinine, Ser: 1.81 mg/dL — ABNORMAL HIGH (ref 0.61–1.24)
GFR, Estimated: 36 mL/min — ABNORMAL LOW (ref >60)
Glucose, Bld: 96 mg/dL (ref 70–99)
Potassium: 3 mmol/L — ABNORMAL LOW (ref 3.5–5.1)
Sodium: 141 mmol/L (ref 135–145)

## 2020-05-29 LAB — HEPATIC FUNCTION PANEL
ALT: 66 U/L — ABNORMAL HIGH (ref 0–44)
AST: 42 U/L — ABNORMAL HIGH (ref 15–41)
Albumin: 3.6 g/dL (ref 3.5–5.0)
Alkaline Phosphatase: 66 U/L (ref 38–126)
Bilirubin, Direct: 1.3 mg/dL — ABNORMAL HIGH (ref 0.0–0.2)
Indirect Bilirubin: 2.2 mg/dL — ABNORMAL HIGH (ref 0.3–0.9)
Total Bilirubin: 3.5 mg/dL — ABNORMAL HIGH (ref 0.3–1.2)
Total Protein: 6.5 g/dL (ref 6.5–8.1)

## 2020-05-29 LAB — SAVE SMEAR(SSMR), FOR PROVIDER SLIDE REVIEW

## 2020-05-29 LAB — MAGNESIUM: Magnesium: 2.6 mg/dL — ABNORMAL HIGH (ref 1.7–2.4)

## 2020-05-29 LAB — ECHOCARDIOGRAM LIMITED
Height: 67 in
S' Lateral: 2.6 cm
Weight: 2409.61 oz

## 2020-05-29 LAB — PATHOLOGIST SMEAR REVIEW

## 2020-05-29 MED ORDER — AMIODARONE HCL IN DEXTROSE 360-4.14 MG/200ML-% IV SOLN
60.0000 mg/h | INTRAVENOUS | Status: DC
  Administered 2020-05-29 (×2): 60 mg/h via INTRAVENOUS
  Filled 2020-05-29 (×2): qty 200

## 2020-05-29 MED ORDER — POTASSIUM CHLORIDE 20 MEQ PO PACK
40.0000 meq | PACK | Freq: Two times a day (BID) | ORAL | Status: DC
  Administered 2020-05-29 – 2020-05-31 (×4): 40 meq via ORAL
  Filled 2020-05-29 (×4): qty 2

## 2020-05-29 MED ORDER — POTASSIUM CHLORIDE CRYS ER 20 MEQ PO TBCR: 80.0000 meq | EXTENDED_RELEASE_TABLET | Freq: Once | ORAL | Status: DC

## 2020-05-29 MED ORDER — DOCUSATE SODIUM 100 MG PO CAPS
100.0000 mg | ORAL_CAPSULE | Freq: Every day | ORAL | Status: DC
  Administered 2020-05-29 – 2020-06-01 (×4): 100 mg via ORAL
  Filled 2020-05-29 (×4): qty 1

## 2020-05-29 MED ORDER — AMIODARONE HCL IN DEXTROSE 360-4.14 MG/200ML-% IV SOLN
30.0000 mg/h | INTRAVENOUS | Status: DC
  Administered 2020-05-30 (×2): 30 mg/h via INTRAVENOUS
  Filled 2020-05-29 (×2): qty 200

## 2020-05-29 MED ORDER — FUROSEMIDE 10 MG/ML IJ SOLN
20.0000 mg | Freq: Two times a day (BID) | INTRAMUSCULAR | Status: DC
  Administered 2020-05-29 – 2020-05-30 (×3): 20 mg via INTRAVENOUS
  Filled 2020-05-29 (×3): qty 2

## 2020-05-29 MED ORDER — AMIODARONE LOAD VIA INFUSION
150.0000 mg | Freq: Once | INTRAVENOUS | Status: AC
  Administered 2020-05-29: 150 mg via INTRAVENOUS
  Filled 2020-05-29: qty 83.34

## 2020-05-29 NOTE — Progress Notes (Signed)
Physical Therapy Treatment Patient Details Name: Robert Frye MRN: [id number] DOB: [birth year] Today's Date: 05/29/2020    History of Present Illness Pt is an 87 y/o M admitted on 05/27/20 with c/c of SOB. Pt admitted for tx of CHF exacerbation. PMH: heart failure with preserved EF, persistent A-fib, persistent troponinemia, HTN, CKD, hypothyroidism, dementia, HOH    PT Comments    MD cleared pt for ambulation despite elevated resting HR. Pt received in recliner & agreeable to tx with wife present. Pt requires cuing for safe hand placement during sit<>stand transfers but is able to ambulate 50 ft with RW & min assist. Pt notes feeling "woozy" upon standing but states it goes away, but notes same symptoms during ambulation so pt returned to room. Pt performed BLE LAQ for strengthening. Educated pt's wife on recommendation of assistance with mobility & use of RW & provided her with gait belt. Pt's wife Robert Frye) reports she feels comfortable assisting pt. Will continue to follow pt acutely to progress gait with LRAD, address balance & endurance.   BP lying in reclined chair: 110/81 mmHg in LUE (MAP 91) Standing at 0: 112/73 mmhg in LUE (MAP 84) Sitting in chair after ambulating: 113/72 mmHg in LUE (MAP 85)  SpO2 95% on room air after ambulation HR = 127-132 bpm at rest & during activity during session    Follow Up Recommendations  Home health PT;Supervision for mobility/OOB     Equipment Recommendations  Rolling walker with 5" wheels;3in1 (PT)    Recommendations for Other Services       Precautions / Restrictions Precautions Precautions: Fall Restrictions Weight Bearing Restrictions: No    Mobility  Bed Mobility Overal bed mobility: Needs Assistance Bed Mobility: Supine to Sit     Supine to sit: Min assist     General bed mobility comments: not observed, pt received & left sitting in recliner    Transfers Overall transfer level: Needs assistance Equipment used: Rolling  walker (2 wheeled) Transfers: Sit to/from Stand Sit to Stand: Min assist;Min guard         General transfer comment: cues for RW mgt, hand placement  Ambulation/Gait Ambulation/Gait assistance: Min Chemical engineer (Feet): 50 Feet Assistive device: Rolling walker (2 wheeled) Gait Pattern/deviations: Decreased step length - right;Decreased step length - left;Decreased stride length Gait velocity: decreased       Stairs             Wheelchair Mobility    Modified Rankin (Stroke Patients Only)       Balance Overall balance assessment: Needs assistance Sitting-balance support: Feet supported;No upper extremity supported Sitting balance-Leahy Scale: Good     Standing balance support: Bilateral upper extremity supported;During functional activity Standing balance-Leahy Scale: Fair                              Cognition Arousal/Alertness: Awake/alert Behavior During Therapy: WFL for tasks assessed/performed Overall Cognitive Status: History of cognitive impairments - at baseline                                 General Comments: pt able to follow 1 step commands with increased time (also  [facility]), oriented to self and hospital      Exercises General Exercises - Lower Extremity Long Arc Quad: AROM;Strengthening;Right;Left;10 reps;Seated Other Exercises     General Comments       Pertinent Vitals/Pain  Pain Assessment: No/denies pain          PT Goals (current goals can now be found in the care plan section) Acute Rehab PT Goals Patient Stated Goal: get better PT Goal Formulation: With patient/family Time For Goal Achievement: 06/11/20 Potential to Achieve Goals: Good Progress towards PT goals: Progressing toward goals    Frequency    Min 2X/week      PT Plan Current plan remains appropriate    Co-evaluation              AM-PAC PT "6 Clicks" Mobility   Outcome Measure  Help needed turning from your back to  your side while in a flat bed without using bedrails?: None Help needed moving from lying on your back to sitting on the side of a flat bed without using bedrails?: None Help needed moving to and from a bed to a chair (including a wheelchair)?: A Little Help needed standing up from a chair using your arms (e.g., wheelchair or bedside chair)?: A Little Help needed to walk in hospital room?: A Little Help needed climbing 3-5 steps with a railing? : A Lot 6 Click Score: 19    End of Session Equipment Utilized During Treatment: Gait belt Activity Tolerance:  (limited 2/2 feeling "woozy" with mobility) Patient left: in chair;with call bell/phone within reach;with chair alarm set;with family/visitor present Nurse Communication:  (c/o feeling "woozy" during session) PT Visit Diagnosis: Muscle weakness (generalized) (M62.81);Difficulty in walking, not elsewhere classified (R26.2);Unsteadiness on feet (R26.81)     Time: 5916-3846 PT Time Calculation (min) (ACUTE ONLY): 17 min  Charges:  $Therapeutic Activity: 8-22 mins                     Aleda Grana, PT, DPT 05/29/20, 3:24 PM    Sandi Mariscal 05/29/2020, 3:21 PM

## 2020-05-29 NOTE — Progress Notes (Signed)
OT Cancellation Note  Patient Details Name: JHOEL STIEG MRN: [id number] DOB: 1931-04-06   Cancelled Treatment:    Reason Eval/Treat Not Completed: Patient at procedure or test/ unavailable. Consult received, chart reviewed. Upon initial attempt, pt having an echocardiogram. Will re-attempt OT eval at later time.  Wynona Canes, MPH, MS, OTR/L ascom [phone] 05/29/20, 9:29 AM

## 2020-05-29 NOTE — Telephone Encounter (Signed)
Noted.  Pt was in the hospital over the weekend.  We should ensure he gets in to see Dr. Kirke Corin at his earliest convenience.

## 2020-05-29 NOTE — Progress Notes (Signed)
*  PRELIMINARY RESULTS* Echocardiogram 2D Echocardiogram has been performed.  Robert Frye [name] 05/29/2020, 9:53 AM

## 2020-05-29 NOTE — Progress Notes (Signed)
pts BP low/ asymptomatic/ MD aware/ orders to hold AM dose of lasix/ will continue to monitor

## 2020-05-29 NOTE — Progress Notes (Signed)
Progress Note  Patient Name: Robert Frye Date of Encounter: 05/29/2020  [facility] Cardiologist: [name], MD   Subjective   Patient hypotensive and lasix held this morning. Amio resumed 3/6. Statin still on hold. LFTs improving but still mildly elevated. UOP -1.6 but patient reports more. He feels breathing is better. No chest pain. Afib rates are elevated. BB still on hold for aute CHF and intermittent hypotension.   Inpatient Medications    Scheduled Meds: . amiodarone  200 mg Oral BID  . apixaban  2.5 mg Oral BID  . cholecalciferol  5,000 Units Oral Daily  . furosemide  40 mg Intravenous BID  . levothyroxine  75 mcg Oral Q0600  . potassium chloride  40 mEq Oral BID  . sodium chloride flush  3 mL Intravenous Q12H  . cyanocobalamin  1,000 mcg Oral Daily   Continuous Infusions: . sodium chloride     PRN Meds: sodium chloride, acetaminophen, ondansetron (ZOFRAN) IV, sodium chloride flush   Vital Signs    Vitals:   05/29/20 0550 05/29/20 0616 05/29/20 0905 05/29/20 1050  BP:  112/74 91/70 115/88  Pulse:  99 (!) 120 (!) 132  Resp:   18 20  Temp:  98.2 F (36.8 C) 98.1 F (36.7 C) 98 F (36.7 C)  TempSrc:  Oral    SpO2:  98% 95% 94%  Weight: 68.3 kg     Height:        Intake/Output Summary (Last 24 hours) at 05/29/2020 1115 Last data filed at 05/29/2020 0950 Gross per 24 hour  Intake 480 ml  Output 1650 ml  Net -1170 ml   Last 3 Weights 05/29/2020 05/28/2020 05/27/2020  Weight (lbs) 150 lb 9.6 oz 150 lb 160 lb 11.2 oz  Weight (kg) 68.312 kg 68.04 kg 72.893 kg      Telemetry    Afib HR 120s - Personally Reviewed  ECG    No new - Personally Reviewed  Physical Exam   GEN: No acute distress.   Neck: + JVD Cardiac: Irreg IRreg, tachy, no murmurs, rubs, or gallops.  Respiratory: Clear to auscultation bilaterally. GI: Soft, nontender, non-distended  MS: Trace lower leg edema; No deformity. Neuro:  Nonfocal  Psych: Normal affect   Labs    High  Sensitivity Troponin:   Recent Labs  Lab 05/27/20 1138 05/27/20 1351  TROPONINIHS 218* 202*      Chemistry Recent Labs  Lab 05/27/20 1138 05/28/20 0532 05/29/20 0421  NA 135 138 141  K 3.9 3.4* 3.0*  CL 100 104 101  CO2 23 22 28   GLUCOSE 123* 95 96  BUN 49* 44* 35*  CREATININE 2.26* 2.01* 1.81*  CALCIUM 9.3 8.8* 8.8*  PROT 6.7 6.4*  --   ALBUMIN 3.9 3.5  --   AST 66* 42*  --   ALT 92* 70*  --   ALKPHOS 71 64  --   BILITOT 3.9* 3.5*  --   GFRNONAA 27* 32* 36*  ANIONGAP 12 12 12      Hematology Recent Labs  Lab 05/27/20 1138  WBC 12.9*  RBC 4.35  HGB 14.8  HCT 44.4  MCV 102.1*  MCH 34.0  MCHC 33.3  RDW 15.6*  PLT 282    BNP Recent Labs  Lab 05/27/20 1615  BNP 2,489.7*     DDimer No results for input(s): DDIMER in the last 168 hours.   Radiology    DG Chest 2 View  Result Date: 05/27/2020 CLINICAL DATA:  Shortness  of breath and weakness. EXAM: CHEST - 2 VIEW COMPARISON:  February 25, 2020 FINDINGS: There is a small right pleural effusion with underlying atelectasis. There is a moderate left effusion with underlying opacity, new in the interval. Stable cardiomegaly. The hila and mediastinum are unremarkable. No pneumothorax. No overt edema. IMPRESSION: 1. New small to moderate left effusion with underlying opacity. The underlying opacity may represent atelectasis. Recommend short-term follow-up imaging to ensure resolution. 2. Tiny right effusion with underlying atelectasis. 3. No other acute abnormalities. Electronically Signed   By: Gerome Sam III M.D   On: 05/27/2020 12:17   US Abdomen Limited RUQ (LIVER/GB)  Result Date: 05/27/2020 CLINICAL DATA:  Transaminitis EXAM: ULTRASOUND ABDOMEN LIMITED RIGHT UPPER QUADRANT COMPARISON:  None. FINDINGS: Gallbladder: No gallstones or wall thickening visualized. No sonographic Murphy sign noted by sonographer. Common bile duct: Diameter: 2.9 mm Liver: No focal lesion identified. Within normal limits in parenchymal  echogenicity. Portal vein is patent on color Doppler imaging with normal direction of blood flow towards the liver. Other: None. IMPRESSION: 1. Right pleural effusion. 2. The gallbladder, common bile duct, and liver are unremarkable. Electronically Signed   By: Gerome Sam III M.D   On: 05/27/2020 15:39    Cardiac Studies   Echo ordered  Echo 01/2020 1. Left ventricular ejection fraction, by estimation, is 55 to 60%. The  left ventricle has normal function. The left ventricle has no regional  wall motion abnormalities. There is severe eccentric left ventricular  hypertrophy of the apical and  basal-septal segments. Left ventricular diastolic parameters are  consistent with Grade I diastolic dysfunction (impaired relaxation).  2. Right ventricular systolic function is normal. The right ventricular  size is mildly enlarged.  3. Left atrial size was severely dilated.  4. Right atrial size was moderately dilated.  5. The mitral valve is normal in structure. Mild mitral valve  regurgitation. No evidence of mitral stenosis.  6. The aortic valve is normal in structure. Aortic valve regurgitation is  not visualized. No aortic stenosis is present.  7. The inferior vena cava is normal in size with greater than 50%  respiratory variability, suggesting right atrial pressure of 3 mmHg.    Patient Profile     87 y.o. male with pmh of HFpEF, hypertrophic CM, CKD 3, HTN, HL, dementia, hypothyroidism who is being seen for acute CHF.   Assessment & Plan    Acute on chronic HFpEF Hypertrophic CM - diagnosed in 2011 in the setting of afib RVR with EF 55-60% with severe apical basal hypertrophy. Management has been difficult due to afib, hypotension, and kidney insufficiency. Amio recently added - Presented with volume overload found to have AKI, transaminitis, acute CHF - BNP 2,489  - BB held for acute CHF, also borderline pressures. If pressures stable restart BB - IV lasix 40mg  BID -  UOP -1.6 - creatinine improving with diuresis - echo ordered - Still think patient needs diuresis. Re-check BP, if ok, give lasix. Md to see  Persistent Afib - s/p failed cardioversion 02/2020 - Recurrent afib and started on amiodarone 2/14 with plan for DCCV after loading - Eliquis 2.5mg  BID - In rate controlled afib on admission, however rates are now elevated to 120s - BB held for acute CHF and amio held for transaminitis.   - Amio resumed 3/6.  Might need cardiversion during admission - Plan to resume BB for better rate control, however has intermittent low pressures.    Elevated HS troponin Demand ischemia - HS  troponin peak 218 and down trending - suspect demand ischemia in the setting of Acute CHF - statin held with increased LFTs, however suspect this is from [facility]. Resume statin if LFTs normalize - No asa with Eliquis - BB held for acute CHF  HTN - intermittently soft - IV lasix as tolerated - BB held as above  Transaminitis - in the setting of recently started amiodarone so this was discontinued. Statin also held - Improving but still elevated - Work-up so far unrevealing - amiodarone restarted  AKI/CKD stage 3 - Scr 2.26 on admission - Scr baseline 1.4-1.7 - creatinine improving with diuresis  HLD - statin held for LFTs, resume with normalization of LFTs  Hypokalemia - supplement as needed, goal >4 - ordered by IM  For questions or updates, please contact [facility] Please consult [email or web address] for contact info under        Signed, Cadence David Stall, PA-C  05/29/2020, 11:15 AM

## 2020-05-29 NOTE — Evaluation (Signed)
Occupational Therapy Evaluation Patient Details Name: Robert Frye MRN: [id number] DOB: 08-18-1931 Today's Date: 05/29/2020    History of Present Illness Pt is an 87 y/o M admitted on 05/27/20 with c/c of SOB. Pt admitted for tx of CHF exacerbation. PMH: heart failure with preserved EF, persistent A-fib, persistent troponinemia, HTN, CKD, hypothyroidism, dementia, HOH   Clinical Impression   Pt was seen for OT evaluation this date. Prior to hospital admission, pt was ambulating without AD per spouse and only recently requiring assist in the past couple weeks with toilet transfers. Spouse manages medications, meals, cleaning, and provides transportation. Pt alert and oriented to self and hospital. Pt endorses feeling "whoozy" while supine in bed. Orthostatic vitals taken (please see below for additional detail) and RN notified of BP drop from supine>sit at end of session. Spouse endorses pt does get whoozy at home with this body position change. Pt/spouse educated in positional changes and strategies to minimize falls and lightheadedness. Pt required MIN A for bed mobility and transfers with cues for sequencing. Upon standing, pt's male purewick became displaced causing urine to drop onto the floor. Pt returned to sitting and CNA called to assist with clean up and linens changes. Pt required MIN A for LB dressing. Pt left seated in recliner with family present. Currently pt demonstrates impairments as described below (See OT problem list) which functionally limit his ability to perform ADL/self-care tasks. HR during session 120's-132. Pt would benefit from skilled OT services to address noted impairments and functional limitations (see below for any additional details) in order to maximize safety and independence while minimizing falls risk and caregiver burden. Upon hospital discharge, recommend HHOT *pending progress* to maximize pt safety and return to functional independence during meaningful occupations  of daily life.     Follow Up Recommendations  Home health OT;Supervision/Assistance - 24 hour (pending progress)    Equipment Recommendations  3 in 1 bedside commode;Other (comment);Tub/shower seat (2WW)    Recommendations for Other Services       Precautions / Restrictions Precautions Precautions: Fall Restrictions Weight Bearing Restrictions: No      Mobility Bed Mobility Overal bed mobility: Needs Assistance Bed Mobility: Supine to Sit     Supine to sit: Min assist          Transfers Overall transfer level: Needs assistance   Transfers: Sit to/from Stand Sit to Stand: Min assist;Min guard         General transfer comment: cues for RW mgt, hand placement    Balance Overall balance assessment: Needs assistance Sitting-balance support: Feet supported;No upper extremity supported Sitting balance-Leahy Scale: Good     Standing balance support: Bilateral upper extremity supported;During functional activity Standing balance-Leahy Scale: Fair                             ADL either performed or assessed with clinical judgement   ADL Overall ADL's : Needs assistance/impaired                                       General ADL Comments: Min A for LB dressing to doff/donn socks, Max A pericare in standing, cues and Min A for ADL transfers with RW, occasional cues for sequencing, Min A for UB dressing     Vision Patient Visual Report: No change from baseline  Perception     Praxis      Pertinent Vitals/Pain Pain Assessment: No/denies pain     Hand Dominance Right   Extremity/Trunk Assessment Upper Extremity Assessment Upper Extremity Assessment: Generalized weakness   Lower Extremity Assessment Lower Extremity Assessment: Generalized weakness       Communication Communication Communication: HOH (hearing aide in R ear)   Cognition Arousal/Alertness: Awake/alert Behavior During Therapy: WFL for tasks  assessed/performed Overall Cognitive Status: History of cognitive impairments - at baseline                                 General Comments: pt able to follow 1 step commands with increased time (also HOH), oriented to self and hospital   General Comments  orthostatic BP's taken, dropped with sup>sit and with increased time did improve a little in standing (see vitals section for detail); took O2 off and placed on room air and pt able to maintain >94% SpO2    Exercises Other Exercises Other Exercises: Bed mobility, pericare in standing (pt male purewick failed and urine leaked onto the floor, pt returned to sitting for clean up), to recliner after clean up   Shoulder Instructions      Home Living Family/patient expects to be discharged to:: Private residence Living Arrangements: Spouse/significant other Available Help at Discharge: Family;Available 24 hours/day Type of Home: House Home Access: Stairs to enter [employer] of Steps: 3 Entrance Stairs-Rails: Left Home Layout: One level     Bathroom Shower/Tub: Walk-in shower;Tub/shower unit (has both, uses walk in)   [employer]: Standard     Home Equipment: None          Prior Functioning/Environment Level of Independence: Independent;Needs assistance  Gait / Transfers Assistance Needed: pt/spouse report pt has been ambulating without AD ADL's / Homemaking Assistance Needed: Spouse reports pt was independent with ADL up until the past couple weeks where she needed to assist him with getting to the bathroom and toilet transfers; spouse assists with med mgt, meals, cleaning, and all transportation   Comments: without AD, denies falls in past 6 months        OT Problem List: Decreased strength;Cardiopulmonary status limiting activity;Decreased cognition;Decreased activity tolerance;Impaired balance (sitting and/or standing);Decreased knowledge of use of DME or AE      OT  Treatment/Interventions: Self-care/ADL training;Therapeutic exercise;Therapeutic activities;Energy conservation;DME and/or AE instruction;Patient/family education;Balance training    OT Goals(Current goals can be found in the care plan section) Acute Rehab OT Goals Patient Stated Goal: get better OT Goal Formulation: With patient/family Time For Goal Achievement: 06/12/20 Potential to Achieve Goals: Good ADL Goals Pt Will Perform Lower Body Dressing: with supervision;with set-up;sit to/from stand Pt Will Transfer to Toilet: ambulating;with supervision;bedside commode (LRAD for amb) Additional ADL Goal #1: Pt will implement learned falls prevention strategies with cues from spouse to minimize OH/dizziness during ADL/ADL transfers.  OT Frequency: Min 2X/week   Barriers to D/C:            Co-evaluation              AM-PAC OT "6 Clicks" Daily Activity     Outcome Measure Help from another person eating meals?: None Help from another person taking care of personal grooming?: A Little Help from another person toileting, which includes using toliet, bedpan, or urinal?: A Little Help from another person bathing (including washing, rinsing, drying)?: A Little Help from another person to put on and taking  off regular upper body clothing?: A Little Help from another person to put on and taking off regular lower body clothing?: A Little 6 Click Score: 19   End of Session Equipment Utilized During Treatment: Gait belt;Rolling walker Nurse Communication: Mobility status;Other (comment) (BP)  Activity Tolerance: Patient tolerated treatment well Patient left: in chair;with call bell/phone within reach;with chair alarm set;with family/visitor present  OT Visit Diagnosis: Other abnormalities of gait and mobility (R26.89);Muscle weakness (generalized) (M62.81);Other symptoms and signs involving cognitive function                Time: 1012-1057 OT Time Calculation (min): 45 min Charges:  OT  General Charges $OT Visit: 1 Visit OT Evaluation $OT Eval Moderate Complexity: 1 Mod OT Treatments $Self Care/Home Management : 38-52 mins  Wynona Canes, MPH, MS, OTR/L ascom [phone] 05/29/20, 12:33 PM

## 2020-05-29 NOTE — Progress Notes (Addendum)
PROGRESS NOTE    Robert Frye  SJG:[id number] DOB: 1932/01/09 DOA: 05/27/2020 PCP: Gracelyn Nurse, MD  Outpatient Specialists: [facility] [place]    Brief Narrative:   Admitted for CHF exacerbation   Assessment & Plan:   Active Problems:   Paroxysmal atrial fibrillation (HCC)   Elevated troponin   Essential hypertension   HLD (hyperlipidemia)   CKD (chronic kidney disease), stage IIIa   Acquired thrombophilia (HCC)   Acute on chronic systolic (congestive) heart failure (HCC)  # Acute on chronic heart failure with reduced ejection fraction # Hypertrophic cardiomyopathy # Cardiorenal syndrome # L pleural effusion # AFib with RVR # Acute hypoxic respiratory failure # Tropinemia Recent dx of chf in setting of a fib. Failed dccv. Started on amio last month. Negative stress test recently. Here w/ dyspnea, found with elevated bnp, new pleural effusion, likely cardiorenal syndrome, new mild O2 requirement. Amio and BB held initially, amio re-started yesterday. Cr improving w/ diuresis.  Demand the likely culprit for mild tropinemia. HR in 120s this morning. Out 1.6 L yesterday - continue lasix 40 IV bid - TTE completed, read pending - likely resumption of BB if EF relatively unchanged.  - f/u cardiology recs when available - monitor I/Os and daily weights - [place] O2, wean as able - consider thoracentesis if respiratory picture doesn't improve w/ diuresis - cont eliquis  # Transaminitis # Hyperbilirubinemia Likely congestive hepatopathy. RUQ u/s unremarkable. Hepatitis panel neg. Chronic problem - trend LFTs - f/u inr, smear to eval for hemolysis - amio resumed, will re-start statin if today's LFTs also improved  # HTN # Orthostasis bp low normal, orthostatic with PT today on IV lasix and amio - monitor - non-pharm approaches to orthostasis  # AKI on CKD 3 Baseline cr 1.4, here 2.26 on presentation, improved to 1.8 today w/ diuresis - cont diuresis and  monitor  # Hypokalemia K 3., likely 2/2 diuretics. Mg wnl - incr k to 40 bid  # Hypothyroid TSH wnl - cont home levo  # Dementia # Debility - pt/ot consulted, advise home health pt/ot  DVT prophylaxis: eliquis Code Status: dnr Family Communication: wife updated bedside 3/7  Level of care: Progressive Cardiac Status is: Inpatient  Remains inpatient appropriate because:Inpatient level of care appropriate due to severity of illness   Dispo: The patient is from: Home              Anticipated d/c is to: tbd              Patient currently is not medically stable to d/c.   Difficult to place patient No        Consultants:  cardiology  Procedures: none  Antimicrobials:  none    Subjective: No acute events overnight. Out of bed in chair this morning. More alert. No complaints. No chest pain or dyspnea. Appetite poor.  Objective: Vitals:   05/29/20 0550 05/29/20 0616 05/29/20 0905 05/29/20 1050  BP:  112/74 91/70 115/88  Pulse:  99 (!) 120 (!) 132  Resp:   18 20  Temp:  98.2 F (36.8 C) 98.1 F (36.7 C) 98 F (36.7 C)  TempSrc:  Oral    SpO2:  98% 95% 94%  Weight: 68.3 kg     Height:        Intake/Output Summary (Last 24 hours) at 05/29/2020 1126 Last data filed at 05/29/2020 0950 Gross per 24 hour  Intake 480 ml  Output 1650 ml  Net -1170 ml  Filed Weights   05/28/20 0504 05/29/20 0550  Weight: 68 kg 68.3 kg    Examination:  General exam: sleeping comfortably Respiratory system: faint rales at bases Cardiovascular system: S1 & S2 heard, irreg irreg, tachycardic, soft systolic murmur. Trace LE edema Gastrointestinal system: Abdomen is nondistended, soft and nontender. No organomegaly or masses felt. Normal bowel sounds heard. Central nervous system: asleep, somewhat arousable Extremities: Symmetric 5 x 5 power. Skin: No rashes, lesions or ulcers Psychiatry: unable to assess    Data Reviewed: I have personally reviewed following labs and  imaging studies  CBC: Recent Labs  Lab 05/27/20 1138  WBC 12.9*  HGB 14.8  HCT 44.4  MCV 102.1*  PLT 282   Basic Metabolic Panel: Recent Labs  Lab 05/27/20 1138 05/28/20 0532 05/29/20 0421  NA 135 138 141  K 3.9 3.4* 3.0*  CL 100 104 101  CO2 23 22 28   GLUCOSE 123* 95 96  BUN 49* 44* 35*  CREATININE 2.26* 2.01* 1.81*  CALCIUM 9.3 8.8* 8.8*  MG  --  2.5* 2.6*   GFR: Estimated Creatinine Clearance: 26.9 mL/min (A) (by C-G formula based on SCr of 1.81 mg/dL (H)). Liver Function Tests: Recent Labs  Lab 05/27/20 1138 05/28/20 0532  AST 66* 42*  ALT 92* 70*  ALKPHOS 71 64  BILITOT 3.9* 3.5*  PROT 6.7 6.4*  ALBUMIN 3.9 3.5   No results for input(s): LIPASE, AMYLASE in the last 168 hours. No results for input(s): AMMONIA in the last 168 hours. Coagulation Profile: Recent Labs  Lab 05/27/20 1138  INR 2.4*   Cardiac Enzymes: No results for input(s): CKTOTAL, CKMB, CKMBINDEX, TROPONINI in the last 168 hours. BNP (last 3 results) No results for input(s): PROBNP in the last 8760 hours. HbA1C: No results for input(s): HGBA1C in the last 72 hours. CBG: No results for input(s): GLUCAP in the last 168 hours. Lipid Profile: No results for input(s): CHOL, HDL, LDLCALC, TRIG, CHOLHDL, LDLDIRECT in the last 72 hours. Thyroid Function Tests: Recent Labs    05/27/20 1147  TSH 3.088   Anemia Panel: Recent Labs    05/27/20 1615  FERRITIN 159  TIBC 367  IRON 66   Urine analysis: No results found for: COLORURINE, APPEARANCEUR, LABSPEC, PHURINE, GLUCOSEU, HGBUR, BILIRUBINUR, KETONESUR, PROTEINUR, UROBILINOGEN, NITRITE, LEUKOCYTESUR Sepsis Labs: @LABRCNTIP (procalcitonin:4,lacticidven:4)  ) Recent Results (from the past 240 hour(s))  Resp Panel by RT-PCR (Flu A&B, Covid) Nasopharyngeal Swab     Status: None   Collection Time: 05/27/20 12:38 PM   Specimen: Nasopharyngeal Swab; Nasopharyngeal(NP) swabs in vial transport medium  Result Value Ref Range Status   SARS  Coronavirus 2 by RT PCR NEGATIVE NEGATIVE Final    Comment: (NOTE) SARS-CoV-2 target nucleic acids are NOT DETECTED.  The SARS-CoV-2 RNA is generally detectable in upper respiratory specimens during the acute phase of infection. The lowest concentration of SARS-CoV-2 viral copies this assay can detect is 138 copies/mL. A negative result does not preclude SARS-Cov-2 infection and should not be used as the sole basis for treatment or other patient management decisions. A negative result may occur with  improper specimen collection/handling, submission of specimen other than nasopharyngeal swab, presence of viral mutation(s) within the areas targeted by this assay, and inadequate number of viral copies(<138 copies/mL). A negative result must be combined with clinical observations, patient history, and epidemiological information. The expected result is Negative.  Fact Sheet for Patients:   Fact Sheet for Healthcare Providers:  07/27/20  This test is no t  yet approved or cleared by the [place] and  has been authorized for detection and/or diagnosis of SARS-CoV-2 by FDA under an Emergency Use Authorization (EUA). This EUA will remain  in effect (meaning this test can be used) for the duration of the COVID-19 declaration under Section 564(b)(1) of the Act, 21 U.S.C.section 360bbb-3(b)(1), unless the authorization is terminated  or revoked sooner.       Influenza A by PCR NEGATIVE NEGATIVE Final   Influenza B by PCR NEGATIVE NEGATIVE Final    Comment: (NOTE) The Xpert Xpress SARS-CoV-2/FLU/RSV plus assay is intended as an aid in the diagnosis of influenza from Nasopharyngeal swab specimens and should not be used as a sole basis for treatment. Nasal washings and aspirates are unacceptable for Xpert Xpress SARS-CoV-2/FLU/RSV testing.  Fact Sheet for  Patients: BloggerCourse.com  Fact Sheet for Healthcare Providers: SeriousBroker.it  This test is not yet approved or cleared by the [place] FDA and has been authorized for detection and/or diagnosis of SARS-CoV-2 by FDA under an Emergency Use Authorization (EUA). This EUA will remain in effect (meaning this test can be used) for the duration of the COVID-19 declaration under Section 564(b)(1) of the Act, 21 U.S.C. section 360bbb-3(b)(1), unless the authorization is terminated or revoked.  Performed at [facility], [address], [place], [place] [address]          Radiology Studies: DG Chest 2 View  Result Date: 05/27/2020 CLINICAL DATA:  Shortness of breath and weakness. EXAM: CHEST - 2 VIEW COMPARISON:  February 25, 2020 FINDINGS: There is a small right pleural effusion with underlying atelectasis. There is a moderate left effusion with underlying opacity, new in the interval. Stable cardiomegaly. The hila and mediastinum are unremarkable. No pneumothorax. No overt edema. IMPRESSION: 1. New small to moderate left effusion with underlying opacity. The underlying opacity may represent atelectasis. Recommend short-term follow-up imaging to ensure resolution. 2. Tiny right effusion with underlying atelectasis. 3. No other acute abnormalities. Electronically Signed   By: Gerome Sam III M.D   On: 05/27/2020 12:17   US Abdomen Limited RUQ (LIVER/GB)  Result Date: 05/27/2020 CLINICAL DATA:  Transaminitis EXAM: ULTRASOUND ABDOMEN LIMITED RIGHT UPPER QUADRANT COMPARISON:  None. FINDINGS: Gallbladder: No gallstones or wall thickening visualized. No sonographic Murphy sign noted by sonographer. Common bile duct: Diameter: 2.9 mm Liver: No focal lesion identified. Within normal limits in parenchymal echogenicity. Portal vein is patent on color Doppler imaging with normal direction of blood flow towards the liver. Other: None.  IMPRESSION: 1. Right pleural effusion. 2. The gallbladder, common bile duct, and liver are unremarkable. Electronically Signed   By: Gerome Sam III M.D   On: 05/27/2020 15:39        Scheduled Meds: . amiodarone  200 mg Oral BID  . apixaban  2.5 mg Oral BID  . cholecalciferol  5,000 Units Oral Daily  . docusate sodium  100 mg Oral Daily  . furosemide  40 mg Intravenous BID  . levothyroxine  75 mcg Oral Q0600  . potassium chloride  40 mEq Oral BID  . sodium chloride flush  3 mL Intravenous Q12H  . cyanocobalamin  1,000 mcg Oral Daily   Continuous Infusions: . sodium chloride       LOS: 2 days    Time spent: 30 min    Silvano Bilis, MD [facility]   If 7PM-7AM, please contact night-coverage [email or web address] Password [facility] 05/29/2020, 11:26 AM

## 2020-05-29 NOTE — Consult Note (Addendum)
   Heart Failure Nurse Navigator Note  HFpEF now reported at 50 to 55% previously been reported in December 2021 at 30%.  Severe concentric LVH.  Right ventricular systolic function is normal.     He presented to the emergency room with complaints of worsening shortness of breath and weakness.   Comorbidities:  Persistent atrial fibrillation Hypertension Chronic kidney disease Dementia Hyperlipidemia  Medications:  Amiodarone infusion Amiodarone 200 mg 2 times a day Eliquis 2.5 mg 2 times a day Furosemide 40 mg 2 times a day IV  Metoprolol tartrate 12-1/2 mg 2 times a day is currently on hold   Labs:  Sodium 141, potassium 3, chloride 121, CO2 28, BUN 35, creatinine 1.8 down from 2.01 of yesterday, magnesium 2.6 Intake 240 mL Output 1650 mL Weight is 68.3 kg Blood pressure 129/90  Assessment:  General-he is awake and alert sitting up in the chair at bedside, wife is present.  He is in no acute distress.  HEENT-pupils are equal, normocephalic, hard of hearing.  Cardiac-heart tones are irregular and tachycardic.  Chest-clear to anterior auscultation.  Abdomen-soft rounded nontender.  Musculoskeletal-there is no lower extremity edema noted.  Psych-is pleasant and appropriate, makes good eye contact.  Neurologic-speech is clear, moves all extremities without difficulty.   Initial visit with patient and his wife on this admission.  She states that home that she had been weighing him daily and had not seen a weight gain of 2 to 3 pounds overnight or 5 pounds within the week to report.  States over the last month he had gone up gradually 5-1/2 pounds.  She also states that he hardly drinks anything, felt if he drank 8 ounces that he was doing good, is also eating very little.   Discussed once he is discharged following up with the outpatient heart failure clinic, has been given an appointment for March 15.   We will continue to follow along.   Tresa Endo RN [facility]

## 2020-05-29 NOTE — Telephone Encounter (Signed)
Pt has appt scheduled with Dr. Kirke Corin this week 3/10.

## 2020-05-30 ENCOUNTER — Inpatient Hospital Stay: Payer: Medicare Other

## 2020-05-30 DIAGNOSIS — I5033 Acute on chronic diastolic (congestive) heart failure: Secondary | ICD-10-CM

## 2020-05-30 DIAGNOSIS — N179 Acute kidney failure, unspecified: Secondary | ICD-10-CM

## 2020-05-30 LAB — COMPREHENSIVE METABOLIC PANEL
ALT: 58 U/L — ABNORMAL HIGH (ref 0–44)
AST: 36 U/L (ref 15–41)
Albumin: 3.5 g/dL (ref 3.5–5.0)
Alkaline Phosphatase: 68 U/L (ref 38–126)
Anion gap: 12 (ref 5–15)
BUN: 29 mg/dL — ABNORMAL HIGH (ref 8–23)
CO2: 25 mmol/L (ref 22–32)
Calcium: 8.6 mg/dL — ABNORMAL LOW (ref 8.9–10.3)
Chloride: 101 mmol/L (ref 98–111)
Creatinine, Ser: 1.67 mg/dL — ABNORMAL HIGH (ref 0.61–1.24)
GFR, Estimated: 39 mL/min — ABNORMAL LOW (ref >60)
Glucose, Bld: 105 mg/dL — ABNORMAL HIGH (ref 70–99)
Potassium: 3.4 mmol/L — ABNORMAL LOW (ref 3.5–5.1)
Sodium: 138 mmol/L (ref 135–145)
Total Bilirubin: 3.2 mg/dL — ABNORMAL HIGH (ref 0.3–1.2)
Total Protein: 6.4 g/dL — ABNORMAL LOW (ref 6.5–8.1)

## 2020-05-30 LAB — PROTEIN ELECTROPHORESIS, SERUM
A/G Ratio: 1.3 (ref 0.7–1.7)
Albumin ELP: 3.7 g/dL (ref 2.9–4.4)
Alpha-1-Globulin: 0.3 g/dL (ref 0.0–0.4)
Alpha-2-Globulin: 0.7 g/dL (ref 0.4–1.0)
Beta Globulin: 0.9 g/dL (ref 0.7–1.3)
Gamma Globulin: 1 g/dL (ref 0.4–1.8)
Globulin, Total: 2.9 g/dL (ref 2.2–3.9)
Total Protein ELP: 6.6 g/dL (ref 6.0–8.5)

## 2020-05-30 LAB — MAGNESIUM: Magnesium: 2.4 mg/dL (ref 1.7–2.4)

## 2020-05-30 LAB — PROTIME-INR
INR: 1.9 — ABNORMAL HIGH (ref 0.8–1.2)
INR: 1.6 — ABNORMAL HIGH (ref 0.8–1.2)
Prothrombin Time: 21.1 seconds — ABNORMAL HIGH (ref 11.4–15.2)
Prothrombin Time: 18.8 seconds — ABNORMAL HIGH (ref 11.4–15.2)

## 2020-05-30 MED ORDER — DM-GUAIFENESIN ER 30-600 MG PO TB12
1.0000 | ORAL_TABLET | Freq: Two times a day (BID) | ORAL | Status: DC
  Administered 2020-05-30 – 2020-06-01 (×4): 1 via ORAL
  Filled 2020-05-30 (×4): qty 1

## 2020-05-30 MED ORDER — ATORVASTATIN CALCIUM 10 MG PO TABS
10.0000 mg | ORAL_TABLET | Freq: Every day | ORAL | Status: DC
  Administered 2020-05-30 – 2020-06-01 (×3): 10 mg via ORAL
  Filled 2020-05-30 (×3): qty 1

## 2020-05-30 MED ORDER — POTASSIUM CHLORIDE CRYS ER 20 MEQ PO TBCR
40.0000 meq | EXTENDED_RELEASE_TABLET | Freq: Once | ORAL | Status: AC
  Administered 2020-05-30: 40 meq via ORAL
  Filled 2020-05-30: qty 2

## 2020-05-30 MED ORDER — METOPROLOL TARTRATE 25 MG PO TABS
12.5000 mg | ORAL_TABLET | Freq: Two times a day (BID) | ORAL | Status: DC
  Administered 2020-05-30 – 2020-06-01 (×5): 12.5 mg via ORAL
  Filled 2020-05-30 (×5): qty 1

## 2020-05-30 NOTE — Care Management Important Message (Signed)
Important Message  Patient Details  Name: Robert Frye MRN: [id number] Date of Birth: June 22, 1931   Medicare Important Message Given:  Yes     [name] 05/30/2020, 11:27 AM

## 2020-05-30 NOTE — Progress Notes (Addendum)
PROGRESS NOTE    Robert Frye  LKG:[id number] DOB: Jul 06, 1931 DOA: 05/27/2020 PCP: Gracelyn Nurse, MD  Outpatient Specialists: [facility] [place]    Brief Narrative:   Admitted for CHF exacerbation   Assessment & Plan:   Active Problems:   Paroxysmal atrial fibrillation (HCC)   Elevated troponin   Essential hypertension   HLD (hyperlipidemia)   CKD (chronic kidney disease), stage IIIa   Acute on chronic diastolic heart failure (HCC)   Acquired thrombophilia (HCC)   Acute on chronic systolic (congestive) heart failure (HCC)  # Acute on chronic heart failure with reduced ejection fraction # Hypertrophic cardiomyopathy # Cardiorenal syndrome # Left pleural effusion # AFib with RVR # Acute hypoxic respiratory failure # Tropinemia Recent dx of chf in setting of a fib. Failed dccv last year. Started on amio last month. Negative stress test recently. Here w/ dyspnea, found with elevated bnp, new pleural effusion, likely cardiorenal syndrome, new mild O2 requirement. Hypoxia resolved, is weaned off o2. Amio and BB held initially, amio re-started 3/6. Cr improving w/ diuresis.  Demand the likely culprit for mild tropinemia.  Out 2.2 L yesterday. Remains in RVR with HR in 120s, hemodynamically stable. Pleural effusion improved on 3/8 cxr. - continue lasix 20 IV bid - TTE completed, EF maintained, severe LVH which is not new - BB re-started by cardiology today (was held given soft BPs and concern for systolic dysfunction) - cardiology planning DCCV tomorrow, will make NPO at midnight - monitor I/Os and daily weights - [place] O2, wean as able - holding on thoracentesis given sig improvement in respiratory status and size of effusion w/ diuresis  - cont eliquis  # Questionable aspiration event Choking with taking today's PO potassium. No hypoxia. CXR shows interval improvement - monitor - SLP swallow eval ordered  # Transaminitis # Hyperbilirubinemia Likely congestive  hepatopathy. RUQ u/s unremarkable. Hepatitis panel neg. Chronic problem. Improving w/ diuresis. Smear not suggestive of underlying etiology - trend LFTs - f/u inr, smear to eval for hemolysis - amio resumed, will re-start statin as well - outpt GI f/u  # HTN # Orthostasis bp wnl, was orthostatic w/ PT on 3/7.   - monitor - non-pharm approaches to orthostasis  # AKI on CKD 3 Baseline cr 1.4, here 2.26 on presentation, improved to 1.62 today w/ diuresis - cont diuresis and monitor  # Hypokalemia K 3.4, likely 2/2 diuretics. Mg wnl - have increased k to 40 bid, will give an additional dose of 40  # Hypothyroid TSH wnl - cont home levo  # Dementia # Debility - pt/ot consulted, advise home health pt/ot  DVT prophylaxis: eliquis Code Status: dnr Family Communication: son updated bedside 3/8  Level of care: Progressive Cardiac Status is: Inpatient  Remains inpatient appropriate because:Inpatient level of care appropriate due to severity of illness   Dispo: The patient is from: Home              Anticipated d/c is to: tbd              Patient currently is not medically stable to d/c.   Difficult to place patient No        Consultants:  cardiology  Procedures: none  Antimicrobials:  none    Subjective: No acute events overnight. Out of bed in chair this morning. More alert. No complaints. No chest pain or dyspnea. Appetite OK. Dyspnea resolved.  Objective: Vitals:   05/30/20 0000 05/30/20 0400 05/30/20 0501 05/30/20 6644  BP: (!) 137/98 (!) 129/92  (!) 129/92  Pulse:  (!) 122  (!) 120  Resp: 15 20  20   Temp: 98.6 F (37 C) (!) 97.5 F (36.4 C)  98 F (36.7 C)  TempSrc: Oral Oral  Oral  SpO2: 97% 93%  97%  Weight:   67.7 kg   Height:        Intake/Output Summary (Last 24 hours) at 05/30/2020 1052 Last data filed at 05/30/2020 0950 Gross per 24 hour  Intake 1005.23 ml  Output 2050 ml  Net -1044.77 ml   Filed Weights   05/29/20 0550 05/30/20 0501   Weight: 68.3 kg 67.7 kg    Examination:  General exam: sleeping comfortably Respiratory system: faint rales at bases Cardiovascular system: S1 & S2 heard, irreg irreg, tachycardic, soft systolic murmur. Trace LE edema Gastrointestinal system: Abdomen is nondistended, soft and nontender. No organomegaly or masses felt. Normal bowel sounds heard. Central nervous system: asleep, somewhat arousable Extremities: Symmetric 5 x 5 power. Skin: No rashes, lesions or ulcers Psychiatry: unable to assess    Data Reviewed: I have personally reviewed following labs and imaging studies  CBC: Recent Labs  Lab 05/27/20 1138  WBC 12.9*  HGB 14.8  HCT 44.4  MCV 102.1*  PLT 282   Basic Metabolic Panel: Recent Labs  Lab 05/27/20 1138 05/28/20 0532 05/29/20 0421 05/30/20 0416  NA 135 138 141 138  K 3.9 3.4* 3.0* 3.4*  CL 100 104 101 101  CO2 23 22 28 25   GLUCOSE 123* 95 96 105*  BUN 49* 44* 35* 29*  CREATININE 2.26* 2.01* 1.81* 1.67*  CALCIUM 9.3 8.8* 8.8* 8.6*  MG  --  2.5* 2.6* 2.4   GFR: Estimated Creatinine Clearance: 29.1 mL/min (A) (by C-G formula based on SCr of 1.67 mg/dL (H)). Liver Function Tests: Recent Labs  Lab 05/27/20 1138 05/28/20 0532 05/29/20 0421 05/30/20 0416  AST 66* 42* 42* 36  ALT 92* 70* 66* 58*  ALKPHOS 71 64 66 68  BILITOT 3.9* 3.5* 3.5* 3.2*  PROT 6.7 6.4* 6.5 6.4*  ALBUMIN 3.9 3.5 3.6 3.5   No results for input(s): LIPASE, AMYLASE in the last 168 hours. No results for input(s): AMMONIA in the last 168 hours. Coagulation Profile: Recent Labs  Lab 05/27/20 1138 05/30/20 0416  INR 2.4* 1.9*   Cardiac Enzymes: No results for input(s): CKTOTAL, CKMB, CKMBINDEX, TROPONINI in the last 168 hours. BNP (last 3 results) No results for input(s): PROBNP in the last 8760 hours. HbA1C: No results for input(s): HGBA1C in the last 72 hours. CBG: No results for input(s): GLUCAP in the last 168 hours. Lipid Profile: No results for input(s): CHOL,  HDL, LDLCALC, TRIG, CHOLHDL, LDLDIRECT in the last 72 hours. Thyroid Function Tests: Recent Labs    05/27/20 1147  TSH 3.088   Anemia Panel: Recent Labs    05/27/20 1615  FERRITIN 159  TIBC 367  IRON 66   Urine analysis: No results found for: COLORURINE, APPEARANCEUR, LABSPEC, PHURINE, GLUCOSEU, HGBUR, BILIRUBINUR, KETONESUR, PROTEINUR, UROBILINOGEN, NITRITE, LEUKOCYTESUR Sepsis Labs: @LABRCNTIP (procalcitonin:4,lacticidven:4)  ) Recent Results (from the past 240 hour(s))  Resp Panel by RT-PCR (Flu A&B, Covid) Nasopharyngeal Swab     Status: None   Collection Time: 05/27/20 12:38 PM   Specimen: Nasopharyngeal Swab; Nasopharyngeal(NP) swabs in vial transport medium  Result Value Ref Range Status   SARS Coronavirus 2 by RT PCR NEGATIVE NEGATIVE Final    Comment: (NOTE) SARS-CoV-2 target nucleic acids are NOT DETECTED.  The  SARS-CoV-2 RNA is generally detectable in upper respiratory specimens during the acute phase of infection. The lowest concentration of SARS-CoV-2 viral copies this assay can detect is 138 copies/mL. A negative result does not preclude SARS-Cov-2 infection and should not be used as the sole basis for treatment or other patient management decisions. A negative result may occur with  improper specimen collection/handling, submission of specimen other than nasopharyngeal swab, presence of viral mutation(s) within the areas targeted by this assay, and inadequate number of viral copies(<138 copies/mL). A negative result must be combined with clinical observations, patient history, and epidemiological information. The expected result is Negative.  Fact Sheet for Patients:  BloggerCourse.com  Fact Sheet for Healthcare Providers:  SeriousBroker.it  This test is no t yet approved or cleared by the [place] FDA and  has been authorized for detection and/or diagnosis of SARS-CoV-2 by FDA under an Emergency  Use Authorization (EUA). This EUA will remain  in effect (meaning this test can be used) for the duration of the COVID-19 declaration under Section 564(b)(1) of the Act, 21 U.S.C.section 360bbb-3(b)(1), unless the authorization is terminated  or revoked sooner.       Influenza A by PCR NEGATIVE NEGATIVE Final   Influenza B by PCR NEGATIVE NEGATIVE Final    Comment: (NOTE) The Xpert Xpress SARS-CoV-2/FLU/RSV plus assay is intended as an aid in the diagnosis of influenza from Nasopharyngeal swab specimens and should not be used as a sole basis for treatment. Nasal washings and aspirates are unacceptable for Xpert Xpress SARS-CoV-2/FLU/RSV testing.  Fact Sheet for Patients: BloggerCourse.com  Fact Sheet for Healthcare Providers: SeriousBroker.it  This test is not yet approved or cleared by the [place] FDA and has been authorized for detection and/or diagnosis of SARS-CoV-2 by FDA under an Emergency Use Authorization (EUA). This EUA will remain in effect (meaning this test can be used) for the duration of the COVID-19 declaration under Section 564(b)(1) of the Act, 21 U.S.C. section 360bbb-3(b)(1), unless the authorization is terminated or revoked.  Performed at [facility], [address], [place], [place] [address]          Radiology Studies: ECHOCARDIOGRAM LIMITED  Result Date: 05/29/2020    ECHOCARDIOGRAM LIMITED REPORT   Patient Name:   Robert Frye Date of Exam: 05/29/2020 Medical Rec #:  [id number]       Height:       67.0 in Accession #:    [id number]      Weight:       150.6 lb Date of Birth:  October 30, 1931        BSA:          1.793 m Patient Age:    87 years        BP:           91/70 mmHg Patient Gender: M               HR:           108 bpm. Exam Location:  [facility] Procedure: Limited Echo, Limited Color Doppler, Cardiac Doppler and Strain            Analysis Indications:     R06.00 Dyspnea  History:          Patient has prior history of Echocardiogram examinations, most                  recent 02/14/2020. Hypertrophic Cardiomyopathy and HFpEF, CKD,  Arrythmias:Atrial Fibrillation; Risk Factors:Hypertension and                  Dyslipidemia.  Sonographer:     Humphrey RollsJoan Heiss RDCS (AE) Referring Phys:  [id number] Dois DavenportHRISTOPHER RONALD [facility] Diagnosing Phys: Lorine BearsMuhammad Arida MD  Sonographer Comments: Suboptimal apical window. Global longitudinal strain was attempted. IMPRESSIONS  1. Left ventricular ejection fraction, by estimation, is 50 to 55%. The left ventricle has low normal function. Left ventricular endocardial border not optimally defined to evaluate regional wall motion. There is severe concentric left ventricular hypertrophy. Left ventricular diastolic parameters are indeterminate. Small cavity size.  2. Right ventricular systolic function is normal. The right ventricular size is normal. Tricuspid regurgitation signal is inadequate for assessing PA pressure.  3. Moderate pleural effusion in the left lateral region.  4. The mitral valve is normal in structure. Mild mitral valve regurgitation. No evidence of mitral stenosis.  5. The aortic valve is normal in structure. Aortic valve regurgitation is not visualized. No aortic stenosis is present.  6. The inferior vena cava is dilated in size with <50% respiratory variability, suggesting right atrial pressure of 15 mmHg. FINDINGS  Left Ventricle: Left ventricular ejection fraction, by estimation, is 50 to 55%. The left ventricle has low normal function. Left ventricular endocardial border not optimally defined to evaluate regional wall motion. The left ventricular internal cavity  size was small. There is severe concentric left ventricular hypertrophy. Left ventricular diastolic parameters are indeterminate. Right Ventricle: The right ventricular size is normal. No increase in right ventricular wall thickness. Right ventricular systolic function is normal. Tricuspid  regurgitation signal is inadequate for assessing PA pressure. Left Atrium: Left atrial size was normal in size. Right Atrium: Right atrial size was normal in size. Pericardium: There is no evidence of pericardial effusion. Mitral Valve: The mitral valve is normal in structure. Mild mitral valve regurgitation. No evidence of mitral valve stenosis. Tricuspid Valve: The tricuspid valve is normal in structure. Tricuspid valve regurgitation is not demonstrated. No evidence of tricuspid stenosis. Aortic Valve: The aortic valve is normal in structure. Aortic valve regurgitation is not visualized. No aortic stenosis is present. Pulmonic Valve: The pulmonic valve was normal in structure. Pulmonic valve regurgitation is not visualized. No evidence of pulmonic stenosis. Aorta: The aortic root is normal in size and structure. Venous: The inferior vena cava is dilated in size with less than 50% respiratory variability, suggesting right atrial pressure of 15 mmHg. IAS/Shunts: No atrial level shunt detected by color flow Doppler. Additional Comments: There is a moderate pleural effusion in the left lateral region. LEFT VENTRICLE PLAX 2D LVIDd:         2.90 cm LVIDs:         2.60 cm LV PW:         1.70 cm LV IVS:        1.40 cm  LEFT ATRIUM         Index LA diam:    3.50 cm 1.95 cm/m Lorine BearsMuhammad Arida MD Electronically signed by Lorine BearsMuhammad Arida MD Signature Date/Time: 05/29/2020/11:44:11 AM    Final         Scheduled Meds: . apixaban  2.5 mg Oral BID  . cholecalciferol  5,000 Units Oral Daily  . docusate sodium  100 mg Oral Daily  . furosemide  20 mg Intravenous BID  . levothyroxine  75 mcg Oral Q0600  . metoprolol tartrate  12.5 mg Oral BID  . potassium chloride  40 mEq Oral BID  . sodium  chloride flush  3 mL Intravenous Q12H  . cyanocobalamin  1,000 mcg Oral Daily   Continuous Infusions: . sodium chloride    . amiodarone 30 mg/hr (05/30/20 0742)     LOS: 3 days    Time spent: 30 min    Silvano Bilis,  MD [facility]   If 7PM-7AM, please contact night-coverage [email or web address] Password [facility] 05/30/2020, 10:52 AM

## 2020-05-30 NOTE — Progress Notes (Addendum)
Progress Note  Patient Name: Robert Frye Date of Encounter: 05/30/2020  [facility] Cardiologist: [name], MD   Subjective   Patient is feeling better today. Denies chest pain, shortness of breath, or palpitations. He is still in rapid afib with rates in the 120s. He is on IV amiodarone. Pressures are better today. Cardioversion was discussed.   Inpatient Medications    Scheduled Meds: . apixaban  2.5 mg Oral BID  . cholecalciferol  5,000 Units Oral Daily  . docusate sodium  100 mg Oral Daily  . furosemide  20 mg Intravenous BID  . levothyroxine  75 mcg Oral Q0600  . metoprolol tartrate  12.5 mg Oral BID  . potassium chloride  40 mEq Oral BID  . sodium chloride flush  3 mL Intravenous Q12H  . cyanocobalamin  1,000 mcg Oral Daily   Continuous Infusions: . sodium chloride    . amiodarone 30 mg/hr (05/30/20 0742)   PRN Meds: sodium chloride, acetaminophen, ondansetron (ZOFRAN) IV, sodium chloride flush   Vital Signs    Vitals:   05/30/20 0000 05/30/20 0400 05/30/20 0501 05/30/20 0734  BP: (!) 137/98 (!) 129/92  (!) 129/92  Pulse:  (!) 122  (!) 120  Resp: 15 20  20   Temp: 98.6 F (37 C) (!) 97.5 F (36.4 C)  98 F (36.7 C)  TempSrc: Oral Oral  Oral  SpO2: 97% 93%  97%  Weight:   67.7 kg   Height:        Intake/Output Summary (Last 24 hours) at 05/30/2020 0910 Last data filed at 05/29/2020 2249 Gross per 24 hour  Intake 765.23 ml  Output 2250 ml  Net -1484.77 ml   Last 3 Weights 05/30/2020 05/29/2020 05/28/2020  Weight (lbs) 149 lb 4 oz 150 lb 9.6 oz 150 lb  Weight (kg) 67.7 kg 68.312 kg 68.04 kg      Telemetry    Afib HR 120s - Personally Reviewed  ECG     No new tracing- Personally Reviewed  Physical Exam   GEN: No acute distress.   Neck: No JVD Cardiac: Irreg Irreg, tachy, no murmurs, rubs, or gallops.  Respiratory: Clear to auscultation bilaterally. GI: Soft, nontender, non-distended  MS: No edema; No deformity. Neuro:  Nonfocal   Psych: Normal affect   Labs    High Sensitivity Troponin:   Recent Labs  Lab 05/27/20 1138 05/27/20 1351  TROPONINIHS 218* 202*      Chemistry Recent Labs  Lab 05/28/20 0532 05/29/20 0421 05/30/20 0416  NA 138 141 138  K 3.4* 3.0* 3.4*  CL 104 101 101  CO2 22 28 25   GLUCOSE 95 96 105*  BUN 44* 35* 29*  CREATININE 2.01* 1.81* 1.67*  CALCIUM 8.8* 8.8* 8.6*  PROT 6.4* 6.5 6.4*  ALBUMIN 3.5 3.6 3.5  AST 42* 42* 36  ALT 70* 66* 58*  ALKPHOS 64 66 68  BILITOT 3.5* 3.5* 3.2*  GFRNONAA 32* 36* 39*  ANIONGAP 12 12 12      Hematology Recent Labs  Lab 05/27/20 1138  WBC 12.9*  RBC 4.35  HGB 14.8  HCT 44.4  MCV 102.1*  MCH 34.0  MCHC 33.3  RDW 15.6*  PLT 282    BNP Recent Labs  Lab 05/27/20 1615  BNP 2,489.7*     DDimer No results for input(s): DDIMER in the last 168 hours.   Radiology    ECHOCARDIOGRAM LIMITED  Result Date: 05/29/2020    ECHOCARDIOGRAM LIMITED REPORT   Patient Name:  Robert Frye Date of Exam: 05/29/2020 Medical Rec #:  [id number]       Height:       67.0 in Accession #:    [id number]      Weight:       150.6 lb Date of Birth:  1932/03/23        BSA:          1.793 m Patient Age:    87 years        BP:           91/70 mmHg Patient Gender: M               HR:           108 bpm. Exam Location:  [facility] Procedure: Limited Echo, Limited Color Doppler, Cardiac Doppler and Strain            Analysis Indications:     R06.00 Dyspnea  History:         Patient has prior history of Echocardiogram examinations, most                  recent 02/14/2020. Hypertrophic Cardiomyopathy and HFpEF, CKD,                  Arrythmias:Atrial Fibrillation; Risk Factors:Hypertension and                  Dyslipidemia.  Sonographer:     Humphrey Rolls RDCS (AE) Referring Phys:  [id number] Dois Davenport [facility] Diagnosing Phys: [name] MD  Sonographer Comments: Suboptimal apical window. Global longitudinal strain was attempted. IMPRESSIONS  1. Left ventricular ejection fraction,  by estimation, is 50 to 55%. The left ventricle has low normal function. Left ventricular endocardial border not optimally defined to evaluate regional wall motion. There is severe concentric left ventricular hypertrophy. Left ventricular diastolic parameters are indeterminate. Small cavity size.  2. Right ventricular systolic function is normal. The right ventricular size is normal. Tricuspid regurgitation signal is inadequate for assessing PA pressure.  3. Moderate pleural effusion in the left lateral region.  4. The mitral valve is normal in structure. Mild mitral valve regurgitation. No evidence of mitral stenosis.  5. The aortic valve is normal in structure. Aortic valve regurgitation is not visualized. No aortic stenosis is present.  6. The inferior vena cava is dilated in size with <50% respiratory variability, suggesting right atrial pressure of 15 mmHg. FINDINGS  Left Ventricle: Left ventricular ejection fraction, by estimation, is 50 to 55%. The left ventricle has low normal function. Left ventricular endocardial border not optimally defined to evaluate regional wall motion. The left ventricular internal cavity  size was small. There is severe concentric left ventricular hypertrophy. Left ventricular diastolic parameters are indeterminate. Right Ventricle: The right ventricular size is normal. No increase in right ventricular wall thickness. Right ventricular systolic function is normal. Tricuspid regurgitation signal is inadequate for assessing PA pressure. Left Atrium: Left atrial size was normal in size. Right Atrium: Right atrial size was normal in size. Pericardium: There is no evidence of pericardial effusion. Mitral Valve: The mitral valve is normal in structure. Mild mitral valve regurgitation. No evidence of mitral valve stenosis. Tricuspid Valve: The tricuspid valve is normal in structure. Tricuspid valve regurgitation is not demonstrated. No evidence of tricuspid stenosis. Aortic Valve: The  aortic valve is normal in structure. Aortic valve regurgitation is not visualized. No aortic stenosis is present. Pulmonic Valve: The pulmonic valve was normal in structure. Pulmonic valve regurgitation  is not visualized. No evidence of pulmonic stenosis. Aorta: The aortic root is normal in size and structure. Venous: The inferior vena cava is dilated in size with less than 50% respiratory variability, suggesting right atrial pressure of 15 mmHg. IAS/Shunts: No atrial level shunt detected by color flow Doppler. Additional Comments: There is a moderate pleural effusion in the left lateral region. LEFT VENTRICLE PLAX 2D LVIDd:         2.90 cm LVIDs:         2.60 cm LV PW:         1.70 cm LV IVS:        1.40 cm  LEFT ATRIUM         Index LA diam:    3.50 cm 1.95 cm/m Lorine BearsMuhammad Arida MD Electronically signed by Lorine BearsMuhammad Arida MD Signature Date/Time: 05/29/2020/11:44:11 AM    Final     Cardiac Studies   Echo 01/2020 1. Left ventricular ejection fraction, by estimation, is 55 to 60%. The  left ventricle has normal function. The left ventricle has no regional  wall motion abnormalities. There is severe eccentric left ventricular  hypertrophy of the apical and  basal-septal segments. Left ventricular diastolic parameters are  consistent with Grade I diastolic dysfunction (impaired relaxation).  2. Right ventricular systolic function is normal. The right ventricular  size is mildly enlarged.  3. Left atrial size was severely dilated.  4. Right atrial size was moderately dilated.  5. The mitral valve is normal in structure. Mild mitral valve  regurgitation. No evidence of mitral stenosis.  6. The aortic valve is normal in structure. Aortic valve regurgitation is  not visualized. No aortic stenosis is present.  7. The inferior vena cava is normal in size with greater than 50%  respiratory variability, suggesting right atrial pressure of 3 mmHg.    Patient Profile     87 y.o. male with pmh of  HFpEF, hypertrophic CM, CKD 3, HTN, HL, dementia, hypothyroidism who is being seen for acute CHF.  Assessment & Plan    Acute on chronic HFpEF Hypertrophic CM - diagnosed in 2011 in the setting of afib RVR with EF 55-60% with severe apical basal hypertrophy. Management has been difficult due to afib, hypotension, and kidney insufficiency. Amio recently added - Presented with volume overload found to have AKI, transaminitis, acute CHF - BNP 2,489  - BB initially held for acute CHF, also borderline pressures. - IV lasix 40mg  BID>>decreased to 20mg  IV BID - UOP -2/2L. Net -3.5L - creatinine improving with diuresis, 1.81>1.67 - echo this admission showed preserved EF - Metoprolol 12.5mg  BID restarted today - Volume status improving. Suspect he might not need much more IV diuresis  Persistent Afib - s/p failed cardioversion 02/2020 - Recurrent afib and started on amiodarone 2/14 with plan for DCCV after loading - Eliquis 2.5mg  BID - In rate controlled afib on admission however BB held for acute CHF and hypotension, and Amio held for transaminitis.   - Heart rates now elevated to 120s.  - Amio resumed 3/6 and switched to IV amiodarone - Needs better rate control. Since BP is better restart Metoprolol 12.5mg  BID  - Plan for DCCV tomorrow  Elevated HS troponin Demand ischemia - HS troponin peak 218 and down trending - suspect demand ischemia in the setting of Acute CHF - statin held with increased LFTs, however [facility] Resume statin if LFTs normalize - No asa with Eliquis - BB resumed as above  HTN -  Improved - BB restarted - IV lasix  Transaminitis - in the setting of recently started amiodarone. amio and stain held - Improving - Work-up so far unrevealing  AKI/CKD stage 3 - Scr baseline 1.4-1.7 - creatinine improving with diuresis  HLD - statin held for LFTs, resume with normalization of LFTs  Hypokalemia - supplement as needed, goal >4 -  ordered by IM  For questions or updates, please contact [facility] Please consult [email or web address] for contact info under        Signed, Cadence David Stall, PA-C  05/30/2020, 9:10 AM

## 2020-05-30 NOTE — Progress Notes (Signed)
PT Cancellation Note  Patient Details Name: Robert Frye MRN: [id number] DOB: November 02, 1931   Cancelled Treatment:    Pt in bed, coughing.  Stated he took some pills and it feels stuck.  Spitting occasionally in cup.  Wife stated RN is aware.  BP elevated 118/104 HR 111.  Wife stated he is having cardiac procedure at 8:00 tomorrow.  Will see pt as appropriate when ready for therapy.   [name] 05/30/2020, 2:25 PM

## 2020-05-31 ENCOUNTER — Inpatient Hospital Stay: Payer: Medicare Other | Admitting: Anesthesiology

## 2020-05-31 ENCOUNTER — Encounter
Admission: EM | Disposition: A | Discharge status: Home Health | Payer: Self-pay | Source: Home / Self Care | Attending: Obstetrics and Gynecology

## 2020-05-31 ENCOUNTER — Encounter: Payer: Self-pay | Admitting: Family Medicine

## 2020-05-31 ENCOUNTER — Other Ambulatory Visit: Payer: Self-pay

## 2020-05-31 DIAGNOSIS — I4892 Unspecified atrial flutter: Secondary | ICD-10-CM

## 2020-05-31 HISTORY — PX: CARDIOVERSION: SHX1299

## 2020-05-31 LAB — COMPREHENSIVE METABOLIC PANEL
ALT: 59 U/L — ABNORMAL HIGH (ref 0–44)
AST: 40 U/L (ref 15–41)
Albumin: 3.6 g/dL (ref 3.5–5.0)
Alkaline Phosphatase: 72 U/L (ref 38–126)
Anion gap: 11 (ref 5–15)
BUN: 27 mg/dL — ABNORMAL HIGH (ref 8–23)
CO2: 24 mmol/L (ref 22–32)
Calcium: 8.9 mg/dL (ref 8.9–10.3)
Chloride: 101 mmol/L (ref 98–111)
Creatinine, Ser: 1.88 mg/dL — ABNORMAL HIGH (ref 0.61–1.24)
GFR, Estimated: 34 mL/min — ABNORMAL LOW (ref >60)
Glucose, Bld: 101 mg/dL — ABNORMAL HIGH (ref 70–99)
Potassium: 4.5 mmol/L (ref 3.5–5.1)
Sodium: 136 mmol/L (ref 135–145)
Total Bilirubin: 3 mg/dL — ABNORMAL HIGH (ref 0.3–1.2)
Total Protein: 6.6 g/dL (ref 6.5–8.1)

## 2020-05-31 LAB — CBC
HCT: 45.4 % (ref 39.0–52.0)
Hemoglobin: 15.1 g/dL (ref 13.0–17.0)
MCH: 33.2 pg (ref 26.0–34.0)
MCHC: 33.3 g/dL (ref 30.0–36.0)
MCV: 99.8 fL (ref 80.0–100.0)
Platelets: 240 10*3/uL (ref 150–400)
RBC: 4.55 MIL/uL (ref 4.22–5.81)
RDW: 15.7 % — ABNORMAL HIGH (ref 11.5–15.5)
WBC: 11.3 10*3/uL — ABNORMAL HIGH (ref 4.0–10.5)
nRBC: 0 % (ref 0.0–0.2)

## 2020-05-31 LAB — MAGNESIUM: Magnesium: 2.4 mg/dL (ref 1.7–2.4)

## 2020-05-31 SURGERY — CARDIOVERSION
Anesthesia: General

## 2020-05-31 MED ORDER — PROPOFOL 10 MG/ML IV BOLUS
INTRAVENOUS | Status: DC | PRN
Start: 2020-05-31 — End: 2020-05-31
  Administered 2020-05-31: 60 mg via INTRAVENOUS

## 2020-05-31 MED ORDER — AMIODARONE HCL 200 MG PO TABS
400.0000 mg | ORAL_TABLET | Freq: Two times a day (BID) | ORAL | Status: DC
  Administered 2020-05-31 – 2020-06-01 (×2): 400 mg via ORAL
  Filled 2020-05-31 (×2): qty 2

## 2020-05-31 MED ORDER — FUROSEMIDE 20 MG PO TABS
20.0000 mg | ORAL_TABLET | Freq: Every day | ORAL | Status: DC
  Administered 2020-05-31 – 2020-06-01 (×2): 20 mg via ORAL
  Filled 2020-05-31 (×3): qty 1

## 2020-05-31 MED ORDER — SODIUM CHLORIDE 0.9 % IV SOLN: INTRAVENOUS | Status: DC | PRN

## 2020-05-31 NOTE — Progress Notes (Addendum)
Progress Note  Patient Name: Robert Frye Date of Encounter: 05/31/2020  [facility] Cardiologist: [name], MD   Subjective   Feels ok, denies cp, sob, palpitations. No acute events overnight. Ready to have procedure/DCCV performed  Inpatient Medications    Scheduled Meds: . [MAR Hold] apixaban  2.5 mg Oral BID  . [MAR Hold] atorvastatin  10 mg Oral Daily  . [MAR Hold] cholecalciferol  5,000 Units Oral Daily  . [MAR Hold] dextromethorphan-guaiFENesin  1 tablet Oral BID  . [MAR Hold] docusate sodium  100 mg Oral Daily  . [MAR Hold] furosemide  20 mg Intravenous BID  . [MAR Hold] levothyroxine  75 mcg Oral Q0600  . [MAR Hold] metoprolol tartrate  12.5 mg Oral BID  . [MAR Hold] potassium chloride  40 mEq Oral BID  . [MAR Hold] sodium chloride flush  3 mL Intravenous Q12H  . [MAR Hold] cyanocobalamin  1,000 mcg Oral Daily   Continuous Infusions: . [MAR Hold] sodium chloride    . amiodarone 30 mg/hr (05/31/20 0753)   PRN Meds: [MAR Hold] sodium chloride, [MAR Hold] acetaminophen, [MAR Hold] ondansetron (ZOFRAN) IV, [MAR Hold] sodium chloride flush   Vital Signs    Vitals:   05/30/20 2053 05/30/20 2100 05/31/20 0400 05/31/20 0742  BP:   (!) 142/90 (!) 127/94  Pulse:  (!) 116 (!) 121 (!) 112  Resp: 19 20 16 17   Temp:   98.1 F (36.7 C) 97.8 F (36.6 C)  TempSrc:   Oral Oral  SpO2:  94% 93% 93%  Weight:      Height:        Intake/Output Summary (Last 24 hours) at 05/31/2020 0800 Last data filed at 05/31/2020 07/31/2020 Gross per 24 hour  Intake 1721.25 ml  Output 1000 ml  Net 721.25 ml   Last 3 Weights 05/30/2020 05/29/2020 05/28/2020  Weight (lbs) 149 lb 4 oz 150 lb 9.6 oz 150 lb  Weight (kg) 67.7 kg 68.312 kg 68.04 kg      Telemetry    Atrial flutter - Personally Reviewed  ECG    Atrial flutter - Personally Reviewed  Physical Exam   GEN: No acute distress.   Neck: No JVD Cardiac: regular, tachycardic, no murmurs Respiratory: poor inspiratory  effort GI: Soft, nontender, non-distended  MS: No edema; No deformity. Neuro:  Nonfocal  Psych: Normal affect   Labs    High Sensitivity Troponin:   Recent Labs  Lab 05/27/20 1138 05/27/20 1351  TROPONINIHS 218* 202*      Chemistry Recent Labs  Lab 05/29/20 0421 05/30/20 0416 05/31/20 0502  NA 141 138 136  K 3.0* 3.4* 4.5  CL 101 101 101  CO2 28 25 24   GLUCOSE 96 105* 101*  BUN 35* 29* 27*  CREATININE 1.81* 1.67* 1.88*  CALCIUM 8.8* 8.6* 8.9  PROT 6.5 6.4* 6.6  ALBUMIN 3.6 3.5 3.6  AST 42* 36 40  ALT 66* 58* 59*  ALKPHOS 66 68 72  BILITOT 3.5* 3.2* 3.0*  GFRNONAA 36* 39* 34*  ANIONGAP 12 12 11      Hematology Recent Labs  Lab 05/27/20 1138 05/31/20 0502  WBC 12.9* 11.3*  RBC 4.35 4.55  HGB 14.8 15.1  HCT 44.4 45.4  MCV 102.1* 99.8  MCH 34.0 33.2  MCHC 33.3 33.3  RDW 15.6* 15.7*  PLT 282 240    BNP Recent Labs  Lab 05/27/20 1615  BNP 2,489.7*     DDimer No results for input(s): DDIMER in the last  168 hours.   Radiology    DG Chest Port 1 View  Result Date: 05/30/2020 CLINICAL DATA:  Cough. Sensation of medication being stuck since taking it this morning. EXAM: PORTABLE CHEST 1 VIEW COMPARISON:  PA and lateral chest 05/27/2020. FINDINGS: Left pleural effusion and basilar airspace disease are improved. Right lung is clear. Heart size is normal. No pneumothorax. Aortic atherosclerosis noted. No radiopaque foreign body is seen. No acute bony abnormality. IMPRESSION: Decreased left pleural effusion and basilar airspace disease. No new abnormality. Electronically Signed   By: Drusilla Kanner M.D.   On: 05/30/2020 16:11   ECHOCARDIOGRAM LIMITED  Result Date: 05/29/2020    ECHOCARDIOGRAM LIMITED REPORT   Patient Name:   Robert Frye Date of Exam: 05/29/2020 Medical Rec #:  [id number]       Height:       67.0 in Accession #:    [id number]      Weight:       150.6 lb Date of Birth:  1931-10-24        BSA:          1.793 m Patient Age:    87 years        BP:            91/70 mmHg Patient Gender: M               HR:           108 bpm. Exam Location:  [facility] Procedure: Limited Echo, Limited Color Doppler, Cardiac Doppler and Strain            Analysis Indications:     R06.00 Dyspnea  History:         Patient has prior history of Echocardiogram examinations, most                  recent 02/14/2020. Hypertrophic Cardiomyopathy and HFpEF, CKD,                  Arrythmias:Atrial Fibrillation; Risk Factors:Hypertension and                  Dyslipidemia.  Sonographer:     Humphrey Rolls RDCS (AE) Referring Phys:  [id number] [facility] Diagnosing Phys: [name] MD  Sonographer Comments: Suboptimal apical window. Global longitudinal strain was attempted. IMPRESSIONS  1. Left ventricular ejection fraction, by estimation, is 50 to 55%. The left ventricle has low normal function. Left ventricular endocardial border not optimally defined to evaluate regional wall motion. There is severe concentric left ventricular hypertrophy. Left ventricular diastolic parameters are indeterminate. Small cavity size.  2. Right ventricular systolic function is normal. The right ventricular size is normal. Tricuspid regurgitation signal is inadequate for assessing PA pressure.  3. Moderate pleural effusion in the left lateral region.  4. The mitral valve is normal in structure. Mild mitral valve regurgitation. No evidence of mitral stenosis.  5. The aortic valve is normal in structure. Aortic valve regurgitation is not visualized. No aortic stenosis is present.  6. The inferior vena cava is dilated in size with <50% respiratory variability, suggesting right atrial pressure of 15 mmHg. FINDINGS  Left Ventricle: Left ventricular ejection fraction, by estimation, is 50 to 55%. The left ventricle has low normal function. Left ventricular endocardial border not optimally defined to evaluate regional wall motion. The left ventricular internal cavity  size was small. There is severe concentric left  ventricular hypertrophy. Left ventricular diastolic parameters are indeterminate. Right Ventricle: The right ventricular  size is normal. No increase in right ventricular wall thickness. Right ventricular systolic function is normal. Tricuspid regurgitation signal is inadequate for assessing PA pressure. Left Atrium: Left atrial size was normal in size. Right Atrium: Right atrial size was normal in size. Pericardium: There is no evidence of pericardial effusion. Mitral Valve: The mitral valve is normal in structure. Mild mitral valve regurgitation. No evidence of mitral valve stenosis. Tricuspid Valve: The tricuspid valve is normal in structure. Tricuspid valve regurgitation is not demonstrated. No evidence of tricuspid stenosis. Aortic Valve: The aortic valve is normal in structure. Aortic valve regurgitation is not visualized. No aortic stenosis is present. Pulmonic Valve: The pulmonic valve was normal in structure. Pulmonic valve regurgitation is not visualized. No evidence of pulmonic stenosis. Aorta: The aortic root is normal in size and structure. Venous: The inferior vena cava is dilated in size with less than 50% respiratory variability, suggesting right atrial pressure of 15 mmHg. IAS/Shunts: No atrial level shunt detected by color flow Doppler. Additional Comments: There is a moderate pleural effusion in the left lateral region. LEFT VENTRICLE PLAX 2D LVIDd:         2.90 cm LVIDs:         2.60 cm LV PW:         1.70 cm LV IVS:        1.40 cm  LEFT ATRIUM         Index LA diam:    3.50 cm 1.95 cm/m [name] MD Electronically signed by [name] MD Signature Date/Time: 05/29/2020/11:44:11 AM    Final     Cardiac Studies   Echo 05/29/2020 1. Left ventricular ejection fraction, by estimation, is 50 to 55%. The  left ventricle has low normal function. Left ventricular endocardial  border not optimally defined to evaluate regional wall motion. There is  severe concentric left ventricular   hypertrophy. Left ventricular diastolic parameters are indeterminate.  Small cavity size.  2. Right ventricular systolic function is normal. The right ventricular  size is normal. Tricuspid regurgitation signal is inadequate for assessing  PA pressure.  3. Moderate pleural effusion in the left lateral region.  4. The mitral valve is normal in structure. Mild mitral valve  regurgitation. No evidence of mitral stenosis.  5. The aortic valve is normal in structure. Aortic valve regurgitation is  not visualized. No aortic stenosis is present.  6. The inferior vena cava is dilated in size with <50% respiratory  variability, suggesting right atrial pressure of 15 mmHg.   Patient Profile     87 y.o. male with hx of HCM (concentric hypertrophy), atrial flutter, CKD presenting with shortness of breath, being seen for persistent atrial flutter and HFpEF.  Assessment & Plan    1. Persistent atrial flutter -telemetry shows aflutter -plan for DCCV today- -cont eliquis, IV amiodarone (likely convert to po if successful DCCV) -Maintenance of sinus rhythm might be difficult due to severely dilated left atrium.  Continue amiodarone as above.  2. HCM, HFpEF -creatinine rise today -decrease lasix to 20mg  po (prior to avoid overdiuresing with severe concentric lvh) -lopressor 12.5mg  bid   Total encounter time 35 minutes  Greater than 50% was spent in counseling and coordination of care with the patient       Signed, , MD  05/31/2020, 8:00 AM

## 2020-05-31 NOTE — Transfer of Care (Signed)
Immediate Anesthesia Transfer of Care Note  Patient: Robert Frye  Procedure(s) Performed: CARDIOVERSION (N/A )  Patient Location: Short Stay  Anesthesia Type:General  Level of Consciousness: drowsy  Airway & Oxygen Therapy: Patient connected to nasal cannula oxygen  Post-op Assessment: Post -op Vital signs reviewed and stable  Post vital signs: stable  Last Vitals:  Vitals Value Taken Time  BP 107/76 05/31/20 0800  Temp    Pulse 70 05/31/20 0801  Resp 18 05/31/20 0801  SpO2 100 % 05/31/20 0801    Last Pain:  Vitals:   05/31/20 0742  TempSrc: Oral  PainSc: 0-No pain         Complications: No complications documented.

## 2020-05-31 NOTE — Procedures (Signed)
Cardioversion procedure note For atrial flutter.  Procedure Details:  Consent: Risks of procedure as well as the alternatives and risks of each were explained to the (patient/caregiver).  Consent for procedure obtained.  Time Out: Verified patient identification, verified procedure, site/side was marked, verified correct patient position, special equipment/implants available, medications/allergies/relevent history reviewed, required imaging and test results available.  Performed  Patient placed on cardiac monitor, pulse oximetry, supplemental oxygen as necessary.   Sedation given: propofol IV,per anesthesia team  Pacer pads placed anterior and posterior chest.   Cardioverted 1 time(s).   Cardioverted at  100J. Synchronized biphasic Converted to NSR   Evaluation: Findings: Post procedure EKG shows: NSR Complications: None Patient did tolerate procedure well.  Time Spent Directly with the Patient:  35 minutes   Debbe Odea, M.D.

## 2020-05-31 NOTE — Progress Notes (Signed)
PT Cancellation Note  Patient Details Name: Robert Frye MRN: [id number] DOB: 06-19-1931   Cancelled Treatment:    Reason Eval/Treat Not Completed: Patient not medically ready (Pt underwent cardioversion this morning under general anesthesia. Will need new PT order or order continuation for our services to continue. Will hold services at this time, await updated orders.)   [name] C 05/31/2020, 2:54 PM

## 2020-05-31 NOTE — Progress Notes (Signed)
OT Cancellation Note  Patient Details Name: Robert Frye MRN: [id number] DOB: 03/06/1932   Cancelled Treatment:    Reason Eval/Treat Not Completed: Other (comment)  Pt underwent cardioversion this date requiring anesthesia. Will require new therapy order to resume services. Will compete current OT order and await new post-procedure orders. Thank you.  Rejeana Brock, MS, OTR/L ascom [phone] 05/31/20, 2:58 PM

## 2020-05-31 NOTE — TOC Initial Note (Signed)
Transition of Care [facility]) - Initial/Assessment Note    Patient Details  Name: Robert Frye MRN: [id number] Date of Birth: [birth year]  [facility] CM/SW Contact:    Maree Krabbe, LCSW Phone Number: 05/31/2020, 12:23 PM  Clinical Narrative:   Pt's spouse present at bedside. Pt lives at home with spouse. Pt's spouse inquiring if insurance would cover [facility]. Pt's spouse is ok with it if it covers. Pt's spouse didn't have agency preference. Pt will need a walker and 3n1 per spouse. Referral given to [facility] with [facility] and DME referral given to [facility] with [facility].\  [facility] could possibly service pt depending on d/c date. Undetermined at this time.                Expected Discharge Plan: Home w Home Health Services Barriers to Discharge: Continued Medical Work up   Patient Goals and CMS Choice Patient states their goals for this hospitalization and ongoing recovery are:: to get pt home   Choice offered to / list presented to : Spouse  Expected Discharge Plan and Services Expected Discharge Plan: Home w Home Health Services In-house Referral: NA   Post Acute Care Choice: Home Health Living arrangements for the past 2 months: Single Family Home                                      Prior Living Arrangements/Services Living arrangements for the past 2 months: Single Family Home Lives with:: Spouse Patient language and need for interpreter reviewed:: Yes Do you feel safe going back to the place where you live?: Yes      Need for Family Participation in Patient Care: Yes (Comment) Care giver support system in place?: Yes (comment)   Criminal Activity/Legal Involvement Pertinent to Current Situation/Hospitalization: No - Comment as needed  Activities of Daily Living Home Assistive Devices/Equipment: None ADL Screening (condition at time of admission) Patient's cognitive ability adequate to safely complete daily activities?: No Is the patient deaf or have  difficulty hearing?: Yes Does the patient have difficulty seeing, even when wearing glasses/contacts?: No Does the patient have difficulty concentrating, remembering, or making decisions?: Yes Patient able to express need for assistance with ADLs?: Yes Does the patient have difficulty dressing or bathing?: No Independently performs ADLs?: Yes (appropriate for developmental age) Does the patient have difficulty walking or climbing stairs?: No Weakness of Legs: None Weakness of Arms/Hands: None  Permission Sought/Granted Permission sought to share information with : Family Supports    Share Information with NAME: Tamela Oddi     Permission granted to share info w Relationship: spouse     Emotional Assessment Appearance:: Appears stated age Attitude/Demeanor/Rapport: Unable to Assess Affect (typically observed): Unable to Assess Orientation: : Oriented to Self,Oriented to Place,Oriented to Situation Alcohol / Substance Use: Not Applicable Psych Involvement: No (comment)  Admission diagnosis:  Shortness of breath [R06.02] Pleural effusion [J90] Transaminitis [R74.01] Persistent atrial fibrillation (HCC) [I48.19] AKI (acute kidney injury) (HCC) [N17.9] Acute on chronic systolic (congestive) heart failure (HCC) [I50.23] Systolic congestive heart failure, unspecified HF chronicity (HCC) [I50.20] Patient Active Problem List   Diagnosis Date Noted  . Atrial flutter (HCC)   . Acute on chronic systolic (congestive) heart failure (HCC) 05/27/2020  . Persistent atrial fibrillation (HCC)   . Acute on chronic heart failure with preserved ejection fraction (HFpEF) (HCC)   . Acquired thrombophilia (HCC)   . AKI (acute kidney  injury) (HCC)   . Paroxysmal atrial fibrillation (HCC) 02/13/2020  . Hypokalemia 02/13/2020  . Elevated troponin 02/13/2020  . Essential hypertension 02/13/2020  . HLD (hyperlipidemia) 02/13/2020  . CKD (chronic kidney disease), stage IIIa 02/13/2020  . Acute CHF  (congestive heart failure) (HCC) 02/13/2020  . Dementia without behavioral disturbance (HCC) 04/21/2017  . Hypothyroidism 10/07/2013   PCP:  Gracelyn Nurse, MD Pharmacy:   [facility] - [place], [place] - [address] [place] [address] Phone: [phone] Fax: [phone]  [facility] - [place], [place] - [address] [place], [address] [place], [address] [place] [address] Phone: [phone] Fax: [phone]     Social Determinants of Health (SDOH) Interventions    Readmission Risk Interventions No flowsheet data found.

## 2020-05-31 NOTE — Evaluation (Signed)
Clinical/Bedside Swallow Evaluation Patient Details  Name: Robert Frye MRN: [id number] Date of Birth: June 27, 1931  Today's Date: 05/31/2020 Time: SLP Start Time (ACUTE ONLY): 1330 SLP Stop Time (ACUTE ONLY): 1425 SLP Time Calculation (min) (ACUTE ONLY): 55 min  Past Medical History:  Past Medical History:  Diagnosis Date  . (HFpEF) heart failure with preserved ejection fraction (HCC)   . CKD (chronic kidney disease), stage III (HCC)   . Dementia (HCC)   . History of stress test    a. 03/2020 MV: No ischemia/infarct. EF 40% (55-60% by echo 01/2020).  Marland Kitchen Hyperlipidemia   . Hypertension   . Hypertrophic cardiomyopathy (HCC)    a. 01/2020 Echo: EF 55-60%, no rwma. Sev eccentric LVH of apical and basal-septal segments. gr1 DD. Mildly enlarged RV w/ nl RV fxn. sev dil LA, mod dil RA. Mild MR.  Marland Kitchen Hypothyroidism   . Persistent atrial fibrillation (HCC)    a. Dx 01/2020 s/p DCCV 02/2020; b. 02/2020 Recurrent Afib noted-->rate controlled-->amio started 05/2020 in setting of ongoing CHF; c. CHA2DS2VASc = 4-->Eliquis 2.5 BID.   Past Surgical History:  Past Surgical History:  Procedure Laterality Date  . CARDIOVERSION N/A 03/14/2020   Procedure: CARDIOVERSION;  Surgeon: Iran Ouch, MD;  Location: [facility] ORS;  Service: Cardiovascular;  Laterality: N/A;   HPI:  Pt is a 87 y.o. male with history of Dementia, HOH, heart failure with preserved ejection fraction, persistent A. fib, persistent troponinemia, hypertension, CKD, hypothyroidism, who presents with shortness of breath. Review of chart shows patient has been seeing cardiology frequently over the past several months.  Last echo done November 2021 and showed EF 55 to 60%, no regional wall motion abnormalities, and severe LVH with interventricular septal measurement of 9 mm.  Had stress test in January 2022 which showed EF of 40% at that time without any evidence of ischemia.  Last seen the week prior in cardiology clinic, at that time reporting  significant dyspnea on exertion with plan to perform cardiac MRI and possible left and right heart cath in the near future.  at admit, discussed symptoms with wife and him in detail, she reports that since last cardiology visit he has gotten progressively weaker and more short of breath.  He is unable to get up to go to the bathroom and even take a few steps without becoming very short of breath and unsteady.  CXR: Decreased left pleural effusion and basilar airspace disease. No new  abnormality.   Assessment / Plan / Recommendation Clinical Impression  Pt appears to present w/ adequate oropharyngeal phase swallow function w/ No oropharyngeal phase dysphagia noted, No neuromuscular weakness noted. Pt consumed po trials w/ No overt, clinical s/s of aspiration during the po trials. Pt appears at reduced risk for aspiration following general aspiration precautions. OF NOTE, pt has a Baseline dx of Dementia -- any Cognitive decline can impact oropharyngeal phase swallowing thus increase risk for aspiration, choking. Pt endorsed, and Wife agreed, that he has "some" difficulty swallowing Pills -- he choked when attempting a Potassium tablet w/ water w/ NSG this admit. Wife stated she usually cuts that tablet in half but pt c/o "sharp edges" of the tablet.   During po trials, pt consumed all consistencies given w/ no overt coughing, decline in vocal quality, or change in respiratory presentation during/post trials. NO STRAWS WERE USED - pt drinks from a Cup at home Baseline. Oral phase appeared [facility] w/ timely bolus management, mastication, and control of bolus propulsion for A-P transfer  for swallowing. Oral clearing achieved w/ all trial consistencies. OM Exam appeared [facility] w/ no unilateral weakness noted. Speech Clear. Pt fed self w/ min setup support.    Recommend continue a Regular consistency diet w/ well-Cut meats, moistened foods; Thin liquids VIA CUP - pt does not use straws at home. Recommend general  aspiration precautions, Pills WHOLE in Puree for safer, easier swallowing as pt described Larger and/or Cut pills causing difficulty to swallow. Much Education given on Pills in Puree and why; food consistencies and easy to eat options; general aspiration precautions. NSG to reconsult if any new needs arise. NSG agreed. SLP Visit Diagnosis: Dysphagia, unspecified (R13.10) (baseline Dementia)    Aspiration Risk   (reduced following general precautions)    Diet Recommendation  Regular/Mech Soft diet (meats, foods well-Cut and moistened); Thin liquids VIA CUP ONLY.  General aspiration precautions.  Reduce distractions at meals.  Medication Administration: Whole meds with puree (for safer swallowing forward)    Other  Recommendations Recommended Consults:  (Dietician f/u if needed) Oral Care Recommendations: Oral care BID;Patient independent with oral care Other Recommendations:  (n/a)   Follow up Recommendations None      Frequency and Duration  (n/a)   (n/a)       Prognosis Prognosis for Safe Diet Advancement: Good Barriers to Reach Goals: Cognitive deficits;Time post onset;Severity of deficits (basleine)      Swallow Study   General Date of Onset: 05/27/20 HPI: Pt is a 87 y.o. male with history of Dementia, HOH, heart failure with preserved ejection fraction, persistent A. fib, persistent troponinemia, hypertension, CKD, hypothyroidism, who presents with shortness of breath. Review of chart shows patient has been seeing cardiology frequently over the past several months.  Last echo done November 2021 and showed EF 55 to 60%, no regional wall motion abnormalities, and severe LVH with interventricular septal measurement of 9 mm.  Had stress test in January 2022 which showed EF of 40% at that time without any evidence of ischemia.  Last seen the week prior in cardiology clinic, at that time reporting significant dyspnea on exertion with plan to perform cardiac MRI and possible left and right  heart cath in the near future.  at admit, discussed symptoms with wife and him in detail, she reports that since last cardiology visit he has gotten progressively weaker and more short of breath.  He is unable to get up to go to the bathroom and even take a few steps without becoming very short of breath and unsteady.  CXR: Decreased left pleural effusion and basilar airspace disease. No new  abnormality. Type of Study: Bedside Swallow Evaluation Previous Swallow Assessment: none Diet Prior to this Study: Regular;Thin liquids Temperature Spikes Noted: No (wbc 11.3 declining) Respiratory Status: Nasal cannula (2L) History of Recent Intubation: No Behavior/Cognition: Alert;Cooperative;Pleasant mood;Confused;Distractible;Requires cueing (baseline Dementia) Oral Cavity Assessment: Within Functional Limits Oral Care Completed by SLP: Yes Oral Cavity - Dentition: Adequate natural dentition Vision: Functional for self-feeding Self-Feeding Abilities: Able to feed self;Needs assist;Needs set up Patient Positioning: Upright in bed (needed support cues to sit fully upright) Baseline Vocal Quality: Normal Volitional Cough: Strong Volitional Swallow: Able to elicit    Oral/Motor/Sensory Function Overall Oral Motor/Sensory Function: Within functional limits   Ice Chips Ice chips: Within functional limits Presentation: Spoon (fed; 2 trials)   Thin Liquid Thin Liquid: Within functional limits Presentation: Cup;Self Fed (~4-5 ozs total) Other Comments: NO STRAWS USED    Nectar Thick Nectar Thick Liquid: Not tested   Honey  Thick Honey Thick Liquid: Not tested   Puree Puree: Within functional limits Presentation: Spoon;Self Fed (3-4 trials)   Solid     Solid: Within functional limits Presentation: Spoon;Self Fed (2 trials)        Jerilynn Som, MS, CCC-SLP Speech Language Pathologist [facility] [phone] Skylen Spiering 05/31/2020,5:14 PM

## 2020-05-31 NOTE — Progress Notes (Signed)
[facility] Progress Note  Patient: Robert Frye    IFO:[id number]  DOA: 05/27/2020     Date of Service: the patient was seen and examined on 05/31/2020  Chief Complaint  Patient presents with  . Shortness of Breath   Brief hospital course: Robert Frye is a 87 y.o. male with history of heart failure with preserved ejection fraction, persistent A. fib, persistent troponinemia, hypertension, CKD, hypothyroidism, who presented at [facility] ED on 3/7 with shortness of breath.   Review of chart shows patient has been seeing cardiology frequently over the past several months.  Last echo done November 2021 and showed EF 55 to 60%, no regional wall motion abnormalities, and severe LVH with interventricular septal measurement of 9 mm.  Had stress test in January 2022 which showed EF of 40% at that time without any evidence of ischemia.  Last seen the week prior in cardiology clinic, at that time reporting significant dyspnea on exertion with plan to perform cardiac MRI and possible left and right heart cath in the near future.  Today history very difficult to obtain due to the patient's mild dementia as well as him being hard of hearing.  Discussed symptoms with wife and him in detail, she reports that since her last cardiology visit he has gotten progressively weaker and more short of breath.  He is unable to get up to go to the bathroom and even take a few steps without becoming very short of breath and unsteady.  He has not reported any chest pain or belly pain over this time.  However he has had some intermittent pain in his jaw, last occurred yesterday.  Wife reports he has been taking all his medications as prescribed as well as amiodarone which is new as of last week.  In the ED initial vital signs notable only for O2 sat in the low 90s on room air.  Lab work-up notable for increasing creatinine since last week from 1.9 to 2.2 (baseline 1.4), bump in LFTs with AST 66 and ALT 92 (previously  normal), direct bili 1.4 and indirect bili 2.5 (T bili previously 1.9), troponin 218 (baseline around 120s), CBC at baseline, INR 2.4, Covid and flu test negative.  Chest x-ray was obtained which showed new left-sided pleural effusion.  EKG was obtained which showed no changes compared to priors.  He was admitted for further management of CHF exacerbation and A. fib with RVR, cardiology consulted and patient was managed as below.    Assessment and Plan: Active Problems:   Paroxysmal atrial fibrillation (HCC)   Elevated troponin   Essential hypertension   HLD (hyperlipidemia)   CKD (chronic kidney disease), stage IIIa   Acute on chronic diastolic heart failure (HCC)   Acquired thrombophilia (HCC)   Acute on chronic systolic (congestive) heart failure (HCC)  # Acute on chronic heart failure with reduced ejection fraction # Hypertrophic cardiomyopathy # Cardiorenal syndrome # Left pleural effusion # AFib with RVR # Acute hypoxic respiratory failure # Tropinemia Recent dx of chf in setting of a fib. Failed dccv last year. Started on amio last month. Negative stress test recently. Here w/ dyspnea, found with elevated bnp, new pleural effusion, likely cardiorenal syndrome, new mild O2 requirement. Hypoxia resolved, is weaned off o2. Amio and BB held initially, amio re-started 3/6. Cr improving w/ diuresis.  Demand the likely culprit for mild tropinemia.  Remains in RVR with HR in 120s, hemodynamically stable. Pleural effusion improved on 3/8 cxr. - continue lasix 20  IV bid - TTE completed, EF maintained, severe LVH which is not new - BB re-started by cardiology today (was held given soft BPs and concern for systolic dysfunction) - 3/9 s/p DCCV, tolerated procedure well - monitor I/Os and daily weights - [place] O2, wean as able - holding on thoracentesis given sig improvement in respiratory status and size of effusion w/ diuresis  - cont eliquis Follow cardiology for disposition plan   #  Questionable aspiration event Choking with taking today's PO potassium. No hypoxia. CXR shows interval improvement - monitor - SLP swallow eval ordered  # Transaminitis # Hyperbilirubinemia Likely congestive hepatopathy. RUQ u/s unremarkable. Hepatitis panel neg. Chronic problem. Improving w/ diuresis. Smear not suggestive of underlying etiology - trend LFTs - f/u inr, smear to eval for hemolysis - amio resumed, will re-start statin as well - outpt GI f/u  # HTN # Orthostasis bp wnl, was orthostatic w/ PT on 3/7.   - monitor - non-pharm approaches to orthostasis  # AKI on CKD 3 Baseline cr 1.4, here 2.26 on presentation, improved to 1.62--1.88 increased w/ diuresis - cont diuresis and monitor  # Hypokalemia K 3.4, likely 2/2 diuretics. Mg wnl k 4.5, DC'd oral potassium supplement Monitor and replete as needed   # Hypothyroid TSH wnl - cont home levo  # Dementia # Debility - pt/ot consulted, advise home health pt/ot   Body mass index is 23.38 kg/m.     Diet: Heart healthy DVT Prophylaxis: Therapeutic Anticoagulation with Eliquis   Advance goals of care discussion: DNR  Family Communication: family was present at bedside, at the time of interview.  The pt provided permission to discuss medical plan with the family. Opportunity was given to ask question and all questions were answered satisfactorily.   Disposition:  Pt is from home, admitted with A. fib with RVR, still has atrial arrhythmias, which precludes a safe discharge. 3/9 S/p cardioversion Discharge to home, when cleared by cardiology.  Subjective: No overnight issues, patient was seen after cardioversion done in the morning, patient tolerated procedure well, denied any chest pain or palpitations, no shortness of breath.   Physical Exam: General: Laying in bed, NAD.  Appear in no distress, affect appropriate Eyes: PERRLA ENT: Oral Mucosa Clear, moist  Neck: no JVD,  Cardiovascular: S1 and S2  Present, no Murmur,  Respiratory: good respiratory effort, Bilateral Air entry equal and Decreased, no Crackles, no wheezes Abdomen: Bowel Sound present, Soft and no tenderness,  Skin: no rashes Extremities: no Pedal edema, no calf tenderness Neurologic: without any new focal findings Gait not checked due to patient safety concerns  Vitals:   05/31/20 0815 05/31/20 0830 05/31/20 0835 05/31/20 0900  BP: 113/81 118/82 119/79 119/82  Pulse: 72 73 74 79  Resp: 18 17 18 18   Temp:    97.8 F (36.6 C)  TempSrc:    Oral  SpO2: 96% 94% 95% 94%  Weight:      Height:        Intake/Output Summary (Last 24 hours) at 05/31/2020 1124 Last data filed at 05/31/2020 0900 Gross per 24 hour  Intake 1581.25 ml  Output 1000 ml  Net 581.25 ml   Filed Weights   05/29/20 0550 05/30/20 0501  Weight: 68.3 kg 67.7 kg    Data Reviewed: I have personally reviewed and interpreted daily labs, tele strips, imagings as discussed above. I reviewed all nursing notes, pharmacy notes, vitals, pertinent old records I have discussed plan of care as described above with RN  and patient/family.  CBC: Recent Labs  Lab 05/27/20 1138 05/31/20 0502  WBC 12.9* 11.3*  HGB 14.8 15.1  HCT 44.4 45.4  MCV 102.1* 99.8  PLT 282 240   Basic Metabolic Panel: Recent Labs  Lab 05/27/20 1138 05/28/20 0532 05/29/20 0421 05/30/20 0416 05/31/20 0502  NA 135 138 141 138 136  K 3.9 3.4* 3.0* 3.4* 4.5  CL 100 104 101 101 101  CO2 23 22 28 25 24   GLUCOSE 123* 95 96 105* 101*  BUN 49* 44* 35* 29* 27*  CREATININE 2.26* 2.01* 1.81* 1.67* 1.88*  CALCIUM 9.3 8.8* 8.8* 8.6* 8.9  MG  --  2.5* 2.6* 2.4 2.4    Studies: DG Chest Port 1 View  Result Date: 05/30/2020 CLINICAL DATA:  Cough. Sensation of medication being stuck since taking it this morning. EXAM: PORTABLE CHEST 1 VIEW COMPARISON:  PA and lateral chest 05/27/2020. FINDINGS: Left pleural effusion and basilar airspace disease are improved. Right lung is clear. Heart  size is normal. No pneumothorax. Aortic atherosclerosis noted. No radiopaque foreign body is seen. No acute bony abnormality. IMPRESSION: Decreased left pleural effusion and basilar airspace disease. No new abnormality. Electronically Signed   By: 07/27/2020 M.D.   On: 05/30/2020 16:11    Scheduled Meds: . apixaban  2.5 mg Oral BID  . atorvastatin  10 mg Oral Daily  . cholecalciferol  5,000 Units Oral Daily  . dextromethorphan-guaiFENesin  1 tablet Oral BID  . docusate sodium  100 mg Oral Daily  . furosemide  20 mg Oral Daily  . levothyroxine  75 mcg Oral Q0600  . metoprolol tartrate  12.5 mg Oral BID  . potassium chloride  40 mEq Oral BID  . sodium chloride flush  3 mL Intravenous Q12H  . cyanocobalamin  1,000 mcg Oral Daily   Continuous Infusions: . sodium chloride    . amiodarone 30 mg/hr (05/31/20 0800)   PRN Meds: sodium chloride, acetaminophen, ondansetron (ZOFRAN) IV, sodium chloride flush  Time spent: 35 minutes  Author: 07/31/20. MD [facility] 05/31/2020 11:24 AM  To reach On-call, see care teams to locate the attending and reach out to them via [email or web address]. If 7PM-7AM, please contact night-coverage If you still have difficulty reaching the attending provider, please page the [facility] (Director on Call) for [facility] on amion for assistance.

## 2020-05-31 NOTE — Anesthesia Preprocedure Evaluation (Signed)
Anesthesia Evaluation  Patient identified by MRN, date of birth, ID band Patient awake    Reviewed: Allergy & Precautions, NPO status , Patient's Chart, lab work & pertinent test results  History of Anesthesia Complications Negative for: history of anesthetic complications  Airway Mallampati: III       Dental  (+) Dental Advidsory Given   Pulmonary shortness of breath, COPD,  COPD inhaler, neg recent URI, former smoker,    Pulmonary exam normal        Cardiovascular Exercise Tolerance: Poor hypertension, (-) angina+CHF  (-) Past MI and (-) Cardiac Stents + dysrhythmias Atrial Fibrillation (-) Valvular Problems/Murmurs     Neuro/Psych PSYCHIATRIC DISORDERS Dementia negative neurological ROS     GI/Hepatic negative GI ROS, Neg liver ROS,   Endo/Other  neg diabetesHypothyroidism   Renal/GU Renal InsufficiencyRenal disease  negative genitourinary   Musculoskeletal negative musculoskeletal ROS (+)   Abdominal Normal abdominal exam  (+)   Peds negative pediatric ROS (+)  Hematology  (+) Blood dyscrasia, ,   Anesthesia Other Findings Past Medical History: No date: Arrhythmia     Comment:  paroxysmal atrial fibrillation No date: CHF (congestive heart failure) (HCC) No date: Dementia (HCC) No date: Hyperlipidemia No date: Hypertension No date: Renal insufficiency No date: Thyroid disease  Reproductive/Obstetrics                             Anesthesia Physical  Anesthesia Plan  ASA: III  Anesthesia Plan: General   Post-op Pain Management:    Induction: Intravenous  PONV Risk Score and Plan: 2 and TIVA and Propofol infusion  Airway Management Planned: Nasal Cannula  Additional Equipment:   Intra-op Plan:   Post-operative Plan:   Informed Consent: I have reviewed the patients History and Physical, chart, labs and discussed the procedure including the risks, benefits and  alternatives for the proposed anesthesia with the patient or authorized representative who has indicated his/her understanding and acceptance.     Dental advisory given  Plan Discussed with: CRNA and Surgeon  Anesthesia Plan Comments:         Anesthesia Quick Evaluation

## 2020-05-31 NOTE — Anesthesia Postprocedure Evaluation (Signed)
Anesthesia Post Note  Patient: Robert Frye  Procedure(s) Performed: CARDIOVERSION (N/A )  Patient location during evaluation: Specials Recovery Anesthesia Type: General Level of consciousness: awake and alert Pain management: pain level controlled Vital Signs Assessment: post-procedure vital signs reviewed and stable Respiratory status: spontaneous breathing, nonlabored ventilation, respiratory function stable and patient connected to nasal cannula oxygen Cardiovascular status: blood pressure returned to baseline and stable Postop Assessment: no apparent nausea or vomiting Anesthetic complications: no   No complications documented.   Last Vitals:  Vitals:   05/31/20 0815 05/31/20 0830  BP: 113/81 118/82  Pulse: 72 73  Resp: 18 17  Temp:    SpO2: 96% 94%    Last Pain:  Vitals:   05/31/20 0830  TempSrc:   PainSc: 0-No pain                 Lenard Simmer

## 2020-05-31 NOTE — Progress Notes (Signed)
Mobility Specialist - Progress Note   05/31/20 1600  Mobility  Activity Ambulated in hall  Level of Assistance Moderate assist, patient does 50-74%  Assistive Device Front wheel walker  Distance Ambulated (ft) 80 ft  Mobility Response Tolerated well  Mobility performed by Mobility specialist  $Mobility charge 1 Mobility    Pre-mobility: 76 HR, 93% SpO2 Post-mobility: 78 HR, 94% SpO2   Pt received in bed utilizing room air. Spouse at bedside. St-RN entered to discontinue IV. No pain, nausea, fatigue. Pt new to AD use. Pt able to get EOB with minA. Mild dizziness upon sitting that resolved with rest. Ambulated 80' in hallway with RW. No LOB noted. Max VC to keep RW close to body, biomechanics, and direction. Carries over commands fairly. HOH. No c/o SOB. O2 difficult to determine during ambulation. No s/s of distress. Overall, pt tolerated well. Pt left in bed with all needs in reach, alarm set.    Robert Frye Mobility Specialist 05/31/20, 4:53 PM

## 2020-06-01 ENCOUNTER — Ambulatory Visit: Payer: Medicare Other | Admitting: Cardiovascular Disease

## 2020-06-01 ENCOUNTER — Encounter: Payer: Self-pay | Admitting: Cardiology

## 2020-06-01 DIAGNOSIS — I4892 Unspecified atrial flutter: Secondary | ICD-10-CM

## 2020-06-01 DIAGNOSIS — N1831 Chronic kidney disease, stage 3a: Secondary | ICD-10-CM

## 2020-06-01 DIAGNOSIS — R0602 Shortness of breath: Secondary | ICD-10-CM

## 2020-06-01 DIAGNOSIS — R7401 Elevation of levels of liver transaminase levels: Secondary | ICD-10-CM

## 2020-06-01 LAB — COMPREHENSIVE METABOLIC PANEL
ALT: 45 U/L — ABNORMAL HIGH (ref 0–44)
AST: 30 U/L (ref 15–41)
Albumin: 3.2 g/dL — ABNORMAL LOW (ref 3.5–5.0)
Alkaline Phosphatase: 64 U/L (ref 38–126)
Anion gap: 11 (ref 5–15)
BUN: 27 mg/dL — ABNORMAL HIGH (ref 8–23)
CO2: 23 mmol/L (ref 22–32)
Calcium: 8.6 mg/dL — ABNORMAL LOW (ref 8.9–10.3)
Chloride: 102 mmol/L (ref 98–111)
Creatinine, Ser: 1.65 mg/dL — ABNORMAL HIGH (ref 0.61–1.24)
GFR, Estimated: 40 mL/min — ABNORMAL LOW (ref >60)
Glucose, Bld: 87 mg/dL (ref 70–99)
Potassium: 4.3 mmol/L (ref 3.5–5.1)
Sodium: 136 mmol/L (ref 135–145)
Total Bilirubin: 2.6 mg/dL — ABNORMAL HIGH (ref 0.3–1.2)
Total Protein: 5.9 g/dL — ABNORMAL LOW (ref 6.5–8.1)

## 2020-06-01 LAB — CBC
HCT: 44.4 % (ref 39.0–52.0)
Hemoglobin: 15 g/dL (ref 13.0–17.0)
MCH: 33.5 pg (ref 26.0–34.0)
MCHC: 33.8 g/dL (ref 30.0–36.0)
MCV: 99.1 fL (ref 80.0–100.0)
Platelets: 218 10*3/uL (ref 150–400)
RBC: 4.48 MIL/uL (ref 4.22–5.81)
RDW: 15.4 % (ref 11.5–15.5)
WBC: 10.6 10*3/uL — ABNORMAL HIGH (ref 4.0–10.5)
nRBC: 0 % (ref 0.0–0.2)

## 2020-06-01 LAB — MAGNESIUM: Magnesium: 2.3 mg/dL (ref 1.7–2.4)

## 2020-06-01 LAB — PHOSPHORUS: Phosphorus: 2.8 mg/dL (ref 2.5–4.6)

## 2020-06-01 MED ORDER — AMIODARONE HCL 400 MG PO TABS: 400.0000 mg | ORAL_TABLET | Freq: Two times a day (BID) | ORAL | 0 refills | Status: DC

## 2020-06-01 NOTE — Discharge Summary (Signed)
[facility] Discharge Summary   Patient: Robert Frye JME:[id number]  PCP: Gracelyn Nurse, MD  Date of admission: 05/27/2020   Date of discharge:  06/01/2020     Discharge Diagnoses:  Principal diagnosis A. fib with RVR Active Problems:   Paroxysmal atrial fibrillation (HCC)   Elevated troponin   Essential hypertension   HLD (hyperlipidemia)   CKD (chronic kidney disease), stage IIIa   Acute on chronic heart failure with preserved ejection fraction (HFpEF) (HCC)   Acquired thrombophilia (HCC)   AKI (acute kidney injury) (HCC)   Acute on chronic systolic (congestive) heart failure (HCC)   Atrial flutter (HCC)   Admitted From: Home Disposition:  Home with [facility]  Recommendations for Outpatient Follow-up:  1. PCP: in 1 wk 2. F/u Cardio in 1-2 wks 3. Follow up LABS/TEST:  CXR after 2-3 wks, repeat LFTs in 1 to 2 weeks   Follow-up Information    [facility] Follow up on 06/06/2020.   Specialty: Cardiology Why: at 9:30am.  Contact information: [address] [place] [address] [phone]             Diet recommendation: Cardiac diet  Activity: The patient is advised to gradually reintroduce usual activities, as tolerated  Discharge Condition: stable  Code Status: DNR   History of present illness: As per the H and P dictated on admission  Hospital Course:  Robert Gardella Johnsonis a 87 y.o.malewith history of heart failure with preserved ejection fraction, persistent A. fib, persistent troponinemia, hypertension, CKD, hypothyroidism, who presented at [facility] ED on 3/7 with shortness of breath.  Review of chart shows patient has been seeing cardiology frequently over the past several months. Last echo done November 2021 and showed EF 55 to 60%, no regional wall motion abnormalities, and severe LVH with interventricular septal measurement of 9 mm. Had stress test in January 2022 which showed EF of  40% at that time without any evidence of ischemia. Last seen the week prior in cardiology clinic, at that time reporting significant dyspnea on exertion with plan to perform cardiac MRI and possible left and right heart cath in the near future. Today 05/27/20 history very difficult to obtain due to the patient's mild dementia as well as him being hard of hearing. Discussed symptoms with wife and him in detail, she reports that since her last cardiology visit he has gotten progressively weaker and more short of breath. He is unable to get up to go to the bathroom and even take a few steps without becoming very short of breath and unsteady. He has not reported any chest pain or belly pain over this time. However he has had some intermittent pain in his jaw, last occurred yesterday. Wife reports he has been taking all his medications as prescribed as well as amiodarone which is new as of last week. In the EDinitial vital signs notable only for O2 sat in the low 90s on room air. Lab work-up notable for increasing creatinine since last week from 1.9 to2.2(baseline 1.4), bump in LFTs with AST 66 and ALT 92 (previously normal), direct bili 1.4 and indirect bili 2.5 (T bili previously 1.9), troponin 218 (baseline around 120s), CBC at baseline, INR 2.4, Covid and flu test negative. Chest x-ray was obtained which showed new left-sided pleural effusion. EKG was obtained which showed no changes compared to priors. He was admitted for further management of CHF exacerbation and A. fib with RVR, cardiology consulted and  patient was managed as below. Assessment and Plan: Active Problems: Paroxysmal atrial fibrillation (HCC) Elevated troponin Essential hypertension HLD (hyperlipidemia) CKD (chronic kidney disease), stage IIIa Acute on chronic diastolic heart failure (HCC) Acquired thrombophilia (HCC) Acute on chronic systolic (congestive) heart failure (HCC)  # Acute on chronic heart failure  with reduced ejection fraction, # Hypertrophic cardiomyopathy # Cardiorenal syndrome, # Leftpleural effusion, # AFib with RVR # Acute hypoxic respiratory failure, # Tropinemia Recent dx of chf in setting of a fib. Failed dccvlast year. Started on amio last month. Negative stress test recently. Here w/ dyspnea, found with elevated bnp, new pleural effusion, likely cardiorenal syndrome, new mild O2 requirement.Hypoxia resolved, is weaned off o2.Amio and BB held initially, amio re-started 3/6. Cr improving w/ diuresis. Demand the likely culprit for mild tropinemia. Remains in RVR with HR in 120s, hemodynamically stable.Pleural effusion improved on 3/8 cxr. S/p lasix20 IV bid TTE completed,EF maintained, severe LVH which is not new. BB re-started by cardiology today (was held given soft BPs and concern for systolic dysfunction) - 3/9 s/p DCCV, tolerated procedure well.supplemental O2 weaned off, patient is saturating well on room air.  Thoracentesis was held given sig improvement in respiratory statusand size of effusion w/ diuresis. cont eliquis and started amiodarone 400 mg p.o. twice daily.  Cardiology cleared to discharge home and follow-up as an outpatient # Questionable aspiration event, Choking with PO potassium. No hypoxia. CXR shows interval improvement. SLP swallow eval done # Transaminitis, # Hyperbilirubinemia, Likely congestive hepatopathy. RUQ u/s unremarkable. Hepatitis panel neg. Chronic problem. Improving w/ diuresis. Smear not suggestive of underlying etiology. amio resumed,re-start statin as well. outpt GI f/u  and PCP f/u  # HTN, # Orthostasis, bpwnl, was orthostatic w/ PT on 3/7.Resolved, patient remained asymptomatic.  Recommended to monitor BP at home and follow with PCP and cardiology as an outpatient. # AKI on CKD 3, Baseline cr 1.4, here 2.26 on presentation, improved to 1.65, follow with PCP and repeat BMP after 1 to 2 weeks. # Hypokalemia, potassium repleted and  resolved.  Mag within normal limits # Hypothyroid, TSH wnl, cont home continue levothyroxine 75 mcg pod # Dementia and # Debility, - pt/ot consulted, advise home health pt/ot Body mass index is 22.65 kg/m.      Patient was seen by physical therapy, who recommended Home health, which was arranged. On the day of the discharge the patient's vitals were stable, and no other acute medical condition were reported by patient. the patient was felt safe to be discharge at Home with Home health.  Consultants: Cardiologist Procedures: Cardioversion  Discharge Exam: General: Appear in no distress, no Rash; Oral Mucosa Clear, moist. Cardiovascular: S1 and S2 Present, no Murmur, frequent PACs, converted to normal sinus rhythm, atrial arrhythmias noticed on the monitor,  Respiratory: normal respiratory effort, Bilateral Air entry present and no Crackles, no wheezes Abdomen: Bowel Sound present, Soft and no tenderness, no hernia Extremities: no Pedal edema, no calf tenderness Neurology: alert and oriented to time, place, and person affect appropriate.  Filed Weights   05/29/20 0550 05/30/20 0501 06/01/20 0500  Weight: 68.3 kg 67.7 kg 65.6 kg   Vitals:   06/01/20 0752 06/01/20 1138  BP: (!) 128/94 (!) 143/80  Pulse: 63 64  Resp: 16 16  Temp: 98.1 F (36.7 C) 97.7 F (36.5 C)  SpO2: 93% 93%    DISCHARGE MEDICATION: Allergies as of 06/01/2020   No Known Allergies     Medication List    TAKE these medications  amiodarone 400 MG tablet Commonly known as: PACERONE Take 1 tablet (400 mg total) by mouth 2 (two) times daily. What changed:   medication strength  how much to take   apixaban 2.5 MG Tabs tablet Commonly known as: ELIQUIS Take 1 tablet (2.5 mg total) by mouth 2 (two) times daily.   atorvastatin 10 MG tablet Commonly known as: LIPITOR Take 10 mg by mouth daily.   CLEAR EYES OP Place 1 drop into both eyes daily as needed (dry/itchy eyes).   cyanocobalamin 1000 MCG  tablet Take 1,000 mcg by mouth daily.   Dialyvite Vitamin D 5000 125 MCG (5000 UT) capsule Generic drug: Cholecalciferol Take 5,000 Units by mouth daily.   levothyroxine 75 MCG tablet Commonly known as: SYNTHROID Take 75 mcg by mouth daily before breakfast.   metoprolol tartrate 25 MG tablet Commonly known as: LOPRESSOR Take 0.5 tablets (12.5 mg total) by mouth 2 (two) times daily.   potassium chloride 10 MEQ tablet Commonly known as: KLOR-CON Take 1 tablet (10 mEq total) by mouth daily. What changed: how much to take   torsemide 10 MG tablet Commonly known as: DEMADEX Take 1 tablet (10 mg total) by mouth daily.            Durable Medical Equipment  (From admission, onward)         Start     Ordered   05/31/20 1201  For home use only DME 4 wheeled rolling walker with seat  Once       Question:  Patient needs a walker to treat with the following condition  Answer:  SOB (shortness of breath)   05/31/20 1201   05/31/20 1200  For home use only DME 3 n 1  Once        05/31/20 1159         No Known Allergies Discharge Instructions    Call MD for:   Complete by: As directed    Palpitations, shortness of breath Follow with PCP in 1 week Follow with cardiology in 1 to 2 weeks.   Diet - low sodium heart healthy   Complete by: As directed    Increase activity slowly   Complete by: As directed       The results of significant diagnostics from this hospitalization (including imaging, microbiology, ancillary and laboratory) are listed below for reference.    Significant Diagnostic Studies: DG Chest 2 View  Result Date: 05/27/2020 CLINICAL DATA:  Shortness of breath and weakness. EXAM: CHEST - 2 VIEW COMPARISON:  February 25, 2020 FINDINGS: There is a small right pleural effusion with underlying atelectasis. There is a moderate left effusion with underlying opacity, new in the interval. Stable cardiomegaly. The hila and mediastinum are unremarkable. No pneumothorax. No  overt edema. IMPRESSION: 1. New small to moderate left effusion with underlying opacity. The underlying opacity may represent atelectasis. Recommend short-term follow-up imaging to ensure resolution. 2. Tiny right effusion with underlying atelectasis. 3. No other acute abnormalities. Electronically Signed   By: Gerome Sam III M.D   On: 05/27/2020 12:17   DG Chest Port 1 View  Result Date: 05/30/2020 CLINICAL DATA:  Cough. Sensation of medication being stuck since taking it this morning. EXAM: PORTABLE CHEST 1 VIEW COMPARISON:  PA and lateral chest 05/27/2020. FINDINGS: Left pleural effusion and basilar airspace disease are improved. Right lung is clear. Heart size is normal. No pneumothorax. Aortic atherosclerosis noted. No radiopaque foreign body is seen. No acute bony abnormality. IMPRESSION: Decreased left pleural  effusion and basilar airspace disease. No new abnormality. Electronically Signed   By: Drusilla Kanner M.D.   On: 05/30/2020 16:11   ECHOCARDIOGRAM LIMITED  Result Date: 05/29/2020    ECHOCARDIOGRAM LIMITED REPORT   Patient Name:   JERED HEINY Date of Exam: 05/29/2020 Medical Rec #:  [id number]       Height:       67.0 in Accession #:    [id number]      Weight:       150.6 lb Date of Birth:  06/24/1931        BSA:          1.793 m Patient Age:    87 years        BP:           91/70 mmHg Patient Gender: M               HR:           108 bpm. Exam Location:  [facility] Procedure: Limited Echo, Limited Color Doppler, Cardiac Doppler and Strain            Analysis Indications:     R06.00 Dyspnea  History:         Patient has prior history of Echocardiogram examinations, most                  recent 02/14/2020. Hypertrophic Cardiomyopathy and HFpEF, CKD,                  Arrythmias:Atrial Fibrillation; Risk Factors:Hypertension and                  Dyslipidemia.  Sonographer:     Humphrey Rolls RDCS (AE) Referring Phys:  [id number] [facility] Diagnosing Phys: [name] MD  Sonographer  Comments: Suboptimal apical window. Global longitudinal strain was attempted. IMPRESSIONS  1. Left ventricular ejection fraction, by estimation, is 50 to 55%. The left ventricle has low normal function. Left ventricular endocardial border not optimally defined to evaluate regional wall motion. There is severe concentric left ventricular hypertrophy. Left ventricular diastolic parameters are indeterminate. Small cavity size.  2. Right ventricular systolic function is normal. The right ventricular size is normal. Tricuspid regurgitation signal is inadequate for assessing PA pressure.  3. Moderate pleural effusion in the left lateral region.  4. The mitral valve is normal in structure. Mild mitral valve regurgitation. No evidence of mitral stenosis.  5. The aortic valve is normal in structure. Aortic valve regurgitation is not visualized. No aortic stenosis is present.  6. The inferior vena cava is dilated in size with <50% respiratory variability, suggesting right atrial pressure of 15 mmHg. FINDINGS  Left Ventricle: Left ventricular ejection fraction, by estimation, is 50 to 55%. The left ventricle has low normal function. Left ventricular endocardial border not optimally defined to evaluate regional wall motion. The left ventricular internal cavity  size was small. There is severe concentric left ventricular hypertrophy. Left ventricular diastolic parameters are indeterminate. Right Ventricle: The right ventricular size is normal. No increase in right ventricular wall thickness. Right ventricular systolic function is normal. Tricuspid regurgitation signal is inadequate for assessing PA pressure. Left Atrium: Left atrial size was normal in size. Right Atrium: Right atrial size was normal in size. Pericardium: There is no evidence of pericardial effusion. Mitral Valve: The mitral valve is normal in structure. Mild mitral valve regurgitation. No evidence of mitral valve stenosis. Tricuspid Valve: The tricuspid valve is  normal in structure. Tricuspid  valve regurgitation is not demonstrated. No evidence of tricuspid stenosis. Aortic Valve: The aortic valve is normal in structure. Aortic valve regurgitation is not visualized. No aortic stenosis is present. Pulmonic Valve: The pulmonic valve was normal in structure. Pulmonic valve regurgitation is not visualized. No evidence of pulmonic stenosis. Aorta: The aortic root is normal in size and structure. Venous: The inferior vena cava is dilated in size with less than 50% respiratory variability, suggesting right atrial pressure of 15 mmHg. IAS/Shunts: No atrial level shunt detected by color flow Doppler. Additional Comments: There is a moderate pleural effusion in the left lateral region. LEFT VENTRICLE PLAX 2D LVIDd:         2.90 cm LVIDs:         2.60 cm LV PW:         1.70 cm LV IVS:        1.40 cm  LEFT ATRIUM         Index LA diam:    3.50 cm 1.95 cm/m [name] MD Electronically signed by [name] MD Signature Date/Time: 05/29/2020/11:44:11 AM    Final    US Abdomen Limited RUQ (LIVER/GB)  Result Date: 05/27/2020 CLINICAL DATA:  Transaminitis EXAM: ULTRASOUND ABDOMEN LIMITED RIGHT UPPER QUADRANT COMPARISON:  None. FINDINGS: Gallbladder: No gallstones or wall thickening visualized. No sonographic Murphy sign noted by sonographer. Common bile duct: Diameter: 2.9 mm Liver: No focal lesion identified. Within normal limits in parenchymal echogenicity. Portal vein is patent on color Doppler imaging with normal direction of blood flow towards the liver. Other: None. IMPRESSION: 1. Right pleural effusion. 2. The gallbladder, common bile duct, and liver are unremarkable. Electronically Signed   By: Gerome Sam III M.D   On: 05/27/2020 15:39    Microbiology: Recent Results (from the past 240 hour(s))  Resp Panel by RT-PCR (Flu A&B, Covid) Nasopharyngeal Swab     Status: None   Collection Time: 05/27/20 12:38 PM   Specimen: Nasopharyngeal Swab; Nasopharyngeal(NP)  swabs in vial transport medium  Result Value Ref Range Status   SARS Coronavirus 2 by RT PCR NEGATIVE NEGATIVE Final    Comment: (NOTE) SARS-CoV-2 target nucleic acids are NOT DETECTED.  The SARS-CoV-2 RNA is generally detectable in upper respiratory specimens during the acute phase of infection. The lowest concentration of SARS-CoV-2 viral copies this assay can detect is 138 copies/mL. A negative result does not preclude SARS-Cov-2 infection and should not be used as the sole basis for treatment or other patient management decisions. A negative result may occur with  improper specimen collection/handling, submission of specimen other than nasopharyngeal swab, presence of viral mutation(s) within the areas targeted by this assay, and inadequate number of viral copies(<138 copies/mL). A negative result must be combined with clinical observations, patient history, and epidemiological information. The expected result is Negative.  Fact Sheet for Patients:  BloggerCourse.com  Fact Sheet for Healthcare Providers:  SeriousBroker.it  This test is no t yet approved or cleared by the [place] FDA and  has been authorized for detection and/or diagnosis of SARS-CoV-2 by FDA under an Emergency Use Authorization (EUA). This EUA will remain  in effect (meaning this test can be used) for the duration of the COVID-19 declaration under Section 564(b)(1) of the Act, 21 U.S.C.section 360bbb-3(b)(1), unless the authorization is terminated  or revoked sooner.       Influenza A by PCR NEGATIVE NEGATIVE Final   Influenza B by PCR NEGATIVE NEGATIVE Final    Comment: (NOTE) The Xpert Xpress  SARS-CoV-2/FLU/RSV plus assay is intended as an aid in the diagnosis of influenza from Nasopharyngeal swab specimens and should not be used as a sole basis for treatment. Nasal washings and aspirates are unacceptable for Xpert Xpress  SARS-CoV-2/FLU/RSV testing.  Fact Sheet for Patients: BloggerCourse.com  Fact Sheet for Healthcare Providers: SeriousBroker.it  This test is not yet approved or cleared by the [place] FDA and has been authorized for detection and/or diagnosis of SARS-CoV-2 by FDA under an Emergency Use Authorization (EUA). This EUA will remain in effect (meaning this test can be used) for the duration of the COVID-19 declaration under Section 564(b)(1) of the Act, 21 U.S.C. section 360bbb-3(b)(1), unless the authorization is terminated or revoked.  Performed at [facility], [address] [place], [place] [address]      Labs: CBC: Recent Labs  Lab 05/27/20 1138 05/31/20 0502 06/01/20 0502  WBC 12.9* 11.3* 10.6*  HGB 14.8 15.1 15.0  HCT 44.4 45.4 44.4  MCV 102.1* 99.8 99.1  PLT 282 240 218   Basic Metabolic Panel: Recent Labs  Lab 05/28/20 0532 05/29/20 0421 05/30/20 0416 05/31/20 0502 06/01/20 0502  NA 138 141 138 136 136  K 3.4* 3.0* 3.4* 4.5 4.3  CL 104 101 101 101 102  CO2 GLUCOSE 95 96 105* 101* 87  BUN 44* 35* 29* 27* 27*  CREATININE 2.01* 1.81* 1.67* 1.88* 1.65*  CALCIUM 8.8* 8.8* 8.6* 8.9 8.6*  MG 2.5* 2.6* 2.4 2.4 2.3  PHOS  --   --   --   --  2.8   Liver Function Tests: Recent Labs  Lab 05/28/20 0532 05/29/20 0421 05/30/20 0416 05/31/20 0502 06/01/20 0502  AST 42* 42* 36 40 30  ALT 70* 66* 58* 59* 45*  ALKPHOS 64 66 68 72 64  BILITOT 3.5* 3.5* 3.2* 3.0* 2.6*  PROT 6.4* 6.5 6.4* 6.6 5.9*  ALBUMIN 3.5 3.6 3.5 3.6 3.2*   No results for input(s): LIPASE, AMYLASE in the last 168 hours. No results for input(s): AMMONIA in the last 168 hours. Cardiac Enzymes: No results for input(s): CKTOTAL, CKMB, CKMBINDEX, TROPONINI in the last 168 hours. BNP (last 3 results) Recent Labs    02/13/20 0735 02/25/20 0919 05/27/20 1615  BNP 1,073.4* 1,206.6* 2,489.7*   CBG: No results  for input(s): GLUCAP in the last 168 hours.  Time spent: 35 minutes  Signed:  Gillis Santa  [facility]   06/01/2020 11:54 AM

## 2020-06-01 NOTE — TOC Transition Note (Signed)
Transition of Care [facility]) - CM/SW Discharge Note   Patient Details  Name: Robert Frye MRN: [id number] Date of Birth: 1931-09-15  [facility] CM/SW Contact:  Maree Krabbe, LCSW Phone Number: 06/01/2020, 11:59 AM   Clinical Narrative:   Pt dc today. [facility] will service-- PT, OT, RN. No additional needs noted.     Final next level of care: Home w Home Health Services Barriers to Discharge: No Barriers Identified   Patient Goals and CMS Choice Patient states their goals for this hospitalization and ongoing recovery are:: to get pt home   Choice offered to / list presented to : Spouse  Discharge Placement              Patient chooses bed at:  (home)     Patient and family notified of of transfer: 06/01/20  Discharge Plan and Services In-house Referral: NA   Post Acute Care Choice: Home Health                    HH Arranged: RN,PT,OT HH Agency: [facility] Date HH Agency Contacted: 06/01/20 Time HH Agency Contacted: 1159 Representative spoke with at [facility] Agency: Barbara Cower  Social Determinants of Health (SDOH) Interventions     Readmission Risk Interventions No flowsheet data found.

## 2020-06-01 NOTE — Plan of Care (Signed)
SR maintained.  VSS.  Slept at short intervals this shift.  No changes in condition noted.

## 2020-06-01 NOTE — Progress Notes (Signed)
Progress Note  Patient Name: Robert Frye Date of Encounter: 06/01/2020  [facility] Cardiologist: [name], MD   Subjective   Wife and son at bedside, patient somewhat somnolent They report he was awake all night, confused, sundowning Apart from occasionally spitting  into a cup, reports no problems  Inpatient Medications    Scheduled Meds:  amiodarone  400 mg Oral BID   apixaban  2.5 mg Oral BID   atorvastatin  10 mg Oral Daily   cholecalciferol  5,000 Units Oral Daily   dextromethorphan-guaiFENesin  1 tablet Oral BID   docusate sodium  100 mg Oral Daily   furosemide  20 mg Oral Daily   levothyroxine  75 mcg Oral Q0600   metoprolol tartrate  12.5 mg Oral BID   sodium chloride flush  3 mL Intravenous Q12H   cyanocobalamin  1,000 mcg Oral Daily   Continuous Infusions:  sodium chloride     PRN Meds: sodium chloride, acetaminophen, ondansetron (ZOFRAN) IV, sodium chloride flush   Vital Signs    Vitals:   06/01/20 0000 06/01/20 0400 06/01/20 0500 06/01/20 0752  BP: 134/77 131/79  (!) 128/94  Pulse: 64 61  63  Resp: (!) 23 (!) 22  16  Temp: (!) 97.5 F (36.4 C) 97.6 F (36.4 C)  98.1 F (36.7 C)  TempSrc: Oral Oral  Oral  SpO2: 92% (!) 88%  93%  Weight:   65.6 kg   Height:        Intake/Output Summary (Last 24 hours) at 06/01/2020 1021 Last data filed at 06/01/2020 0200 Gross per 24 hour  Intake --  Output 200 ml  Net -200 ml   Last 3 Weights 06/01/2020 05/30/2020 05/29/2020  Weight (lbs) 144 lb 10 oz 149 lb 4 oz 150 lb 9.6 oz  Weight (kg) 65.6 kg 67.7 kg 68.312 kg      Telemetry    Normal sinus with PACs- Personally Reviewed  ECG     - Personally Reviewed  Physical Exam  GEN: No acute distress.   Neck: No JVD Cardiac: RRR, no murmurs, rubs, or gallops.  Respiratory: Clear to auscultation bilaterally. GI: Soft, nontender, non-distended  MS: No edema; No deformity. Neuro:  Nonfocal  Psych: Lethargic  Labs    High  Sensitivity Troponin:   Recent Labs  Lab 05/27/20 1138 05/27/20 1351  TROPONINIHS 218* 202*      Chemistry Recent Labs  Lab 05/30/20 0416 05/31/20 0502 06/01/20 0502  NA 138 136 136  K 3.4* 4.5 4.3  CL 101 101 102  CO2 25 24 23   GLUCOSE 105* 101* 87  BUN 29* 27* 27*  CREATININE 1.67* 1.88* 1.65*  CALCIUM 8.6* 8.9 8.6*  PROT 6.4* 6.6 5.9*  ALBUMIN 3.5 3.6 3.2*  AST 36 40 30  ALT 58* 59* 45*  ALKPHOS 68 72 64  BILITOT 3.2* 3.0* 2.6*  GFRNONAA 39* 34* 40*  ANIONGAP 12 11 11      Hematology Recent Labs  Lab 05/27/20 1138 05/31/20 0502 06/01/20 0502  WBC 12.9* 11.3* 10.6*  RBC 4.35 4.55 4.48  HGB 14.8 15.1 15.0  HCT 44.4 45.4 44.4  MCV 102.1* 99.8 99.1  MCH 34.0 33.2 33.5  MCHC 33.3 33.3 33.8  RDW 15.6* 15.7* 15.4  PLT 282 240 218    BNP Recent Labs  Lab 05/27/20 1615  BNP 2,489.7*     DDimer No results for input(s): DDIMER in the last 168 hours.   Radiology    DG Chest  Port 1 View  Result Date: 05/30/2020 CLINICAL DATA:  Cough. Sensation of medication being stuck since taking it this morning. EXAM: PORTABLE CHEST 1 VIEW COMPARISON:  PA and lateral chest 05/27/2020. FINDINGS: Left pleural effusion and basilar airspace disease are improved. Right lung is clear. Heart size is normal. No pneumothorax. Aortic atherosclerosis noted. No radiopaque foreign body is seen. No acute bony abnormality. IMPRESSION: Decreased left pleural effusion and basilar airspace disease. No new abnormality. Electronically Signed   By: Drusilla Kanner M.D.   On: 05/30/2020 16:11    Cardiac Studies   Echo 1. Left ventricular ejection fraction, by estimation, is 50 to 55%. The  left ventricle has low normal function. Left ventricular endocardial  border not optimally defined to evaluate regional wall motion. There is  severe concentric left ventricular  hypertrophy. Left ventricular diastolic parameters are indeterminate.  Small cavity size.  2. Right ventricular systolic  function is normal. The right ventricular  size is normal. Tricuspid regurgitation signal is inadequate for assessing  PA pressure.  3. Moderate pleural effusion in the left lateral region.  4. The mitral valve is normal in structure. Mild mitral valve  regurgitation. No evidence of mitral stenosis.  5. The aortic valve is normal in structure. Aortic valve regurgitation is  not visualized. No aortic stenosis is present.  6. The inferior vena cava is dilated in size with <50% respiratory  variability, suggesting right atrial pressure of 15 mmHg.   Patient Profile     87 y.o. male with hx of HCM (concentric hypertrophy), atrial flutter, CKD presenting with shortness of breath, being seen for persistent atrial flutter and HFpEF.  Assessment & Plan    1. Persistent atrial fib/flutter Successful cardioversion yesterday On Eliquis, oral amiodarone 400 twice daily Plan is for amiodarone 200 twice daily for 7 days then down to 200 twice daily Close outpatient follow-up High risk of recurrent arrhythmia given severely dilated left atrium  2. HCM, HFpEF Atrial fib flutter with RVR -We will continue Lasix 20 today, will transition back to oral torsemide 10 mg as he was taking as outpatient -lopressor 12.5mg  bid  3.  Delirium/sundowning Long discussion with wife and family at the bedside Has the potential to get worse if he stays inpatient Given rhythm normal sinus, breathing stable, would likely do better at home in his usual surroundings.  Wife exhausted, has been staying in the hospital for several days straight overnight, has not been sleeping as he has been agitated  Long discussion with family, rhythm stable, discussed medications, discussed delirium/sundowning, Discussed their  need for additional help at home  Total encounter time more than 35 minutes  Greater than 50% was spent in counseling and coordination of care with the patient     For questions or updates, please  contact [facility] Please consult [email or web address] for contact info under        Signed, Julien Nordmann, MD  06/01/2020, 10:21 AM

## 2020-06-05 NOTE — Progress Notes (Signed)
Patient ID: Robert Frye, male    DOB: September 25, 1931, 87 y.o.   MRN: [id number]  HPI  Mr Demarais is a 87 y/o male with a history of paroxysmal atrial fibrillation, hyperlipidemia, HTN, CKD, thyroid disease, dementia, previous tobacco use and chronic heart failure.   Echo report from 05/29/20 reviewed and showed an EF of 50-55% along with severe LVH, mild MR and Moderate pleural effusion in the left lateral region. Echo report from 02/14/20 reviewed and showed an EF of 55-60% along with severe LVH, severe LAE and mild MR.   Admitted 05/27/20 due to shortness of breath due to HF and AF with RVR. Cardiology consult obtained. Initially given IV lasix with transition to oral diuretics. Cardioversion done with improvement of symptoms. PT evaluation done. Discharged after 5 days. Was in the ED 02/25/20 due to shortness of breath. CXR shows cardiomegaly. Offered IV lasix but patient prefers to go home with diuretic doubled. Admitted 02/13/20 due to acute on chronic HF. Cardiology consult obtained. Initially given IV lasix with transition to oral diuretics. Eliquis started due to new onset atrial fibrillation. Elevated troponin thought to be due to demand ischemia. Hypokalemia corrected. Discharged after 2 days.   Patient presents for a follow-up visit with a chief complaint of intermittent light-headedness. He has associated decreased appetite, hearing loss, weight loss and difficulty sleeping along with this. His wife says that he sleeps a lot during the day and then is up often at night. He denies any abdominal distention, palpitations, pedal edema, chest pain, shortness of breath, cough, fatigue or wheezing.   Overall, his wife says that he's doing much better since his recent admission and recent cardioversion.   Past Medical History:  Diagnosis Date  . (HFpEF) heart failure with preserved ejection fraction (HCC)   . CKD (chronic kidney disease), stage III (HCC)   . Dementia (HCC)   . History of stress  test    a. 03/2020 MV: No ischemia/infarct. EF 40% (55-60% by echo 01/2020).  Marland Kitchen Hyperlipidemia   . Hypertension   . Hypertrophic cardiomyopathy (HCC)    a. 01/2020 Echo: EF 55-60%, no rwma. Sev eccentric LVH of apical and basal-septal segments. gr1 DD. Mildly enlarged RV w/ nl RV fxn. sev dil LA, mod dil RA. Mild MR.  Marland Kitchen Hypothyroidism   . Persistent atrial fibrillation (HCC)    a. Dx 01/2020 s/p DCCV 02/2020; b. 02/2020 Recurrent Afib noted-->rate controlled-->amio started 05/2020 in setting of ongoing CHF; c. CHA2DS2VASc = 4-->Eliquis 2.5 BID.   Past Surgical History:  Procedure Laterality Date  . CARDIOVERSION N/A 03/14/2020   Procedure: CARDIOVERSION;  Surgeon: Iran Ouch, MD;  Location: [facility] ORS;  Service: Cardiovascular;  Laterality: N/A;  . CARDIOVERSION N/A 05/31/2020   Procedure: CARDIOVERSION;  Surgeon: Debbe Odea, MD;  Location: [facility] ORS;  Service: Cardiovascular;  Laterality: N/A;   Family History  Problem Relation Age of Onset  . Heart attack Father   . Heart disease Sister   . Heart disease Brother    Social History   Tobacco Use  . Smoking status: Former Games developer  . Smokeless tobacco: Never Used  Substance Use Topics  . Alcohol use: Not Currently   No Known Allergies  Prior to Admission medications   Medication Sig Start Date End Date Taking? Authorizing Provider  amiodarone (PACERONE) 400 MG tablet Take 1 tablet (400 mg total) by mouth 2 (two) times daily. 06/01/20 07/01/20 Yes Gillis Santa, MD  apixaban (ELIQUIS) 2.5 MG TABS tablet Take 1 tablet (  2.5 mg total) by mouth 2 (two) times daily. 03/07/20 03/02/21 Yes Visser, Jacquelyn D, PA-C  atorvastatin (LIPITOR) 10 MG tablet Take 10 mg by mouth daily. 11/17/14  Yes [provider]  Cholecalciferol (DIALYVITE VITAMIN D 5000) 125 MCG (5000 UT) capsule Take 5,000 Units by mouth daily.   Yes [provider]  cyanocobalamin 1000 MCG tablet Take 1,000 mcg by mouth daily.   Yes [provider]  levothyroxine (SYNTHROID) 75 MCG tablet Take 75 mcg by mouth daily before breakfast. 11/12/14  Yes [provider]  metoprolol tartrate (LOPRESSOR) 25 MG tablet Take 0.5 tablets (12.5 mg total) by mouth 2 (two) times daily. 04/13/20 04/08/21 Yes Visser, Jacquelyn D, PA-C  Naphazoline HCl (CLEAR EYES OP) Place 1 drop into both eyes daily as needed (dry/itchy eyes).   Yes [provider]  potassium chloride SA (KLOR-CON) 10 MEQ tablet Take 1 tablet (10 mEq total) by mouth daily. Patient taking differently: Take 5 mEq by mouth daily. 04/13/20  Yes Visser, Jacquelyn D, PA-C  torsemide (DEMADEX) 10 MG tablet Take 1 tablet (10 mg total) by mouth daily. 04/13/20 07/12/20 Yes Marisue Ivan D, PA-C    Review of Systems  Constitutional: Positive for appetite change (decreased). Negative for fatigue.  HENT: Positive for hearing loss. Negative for congestion, rhinorrhea and sore throat.   Eyes: Negative.   Respiratory: Negative for cough, chest tightness, shortness of breath and wheezing.   Cardiovascular: Negative for chest pain, palpitations and leg swelling.  Gastrointestinal: Negative for abdominal distention and abdominal pain.  Endocrine: Negative.   Genitourinary: Negative.   Musculoskeletal: Negative for back pain and neck pain.  Skin: Negative.   Allergic/Immunologic: Negative.   Neurological: Positive for light-headedness. Negative for dizziness and headaches.  Hematological: Negative for adenopathy. Does not bruise/bleed easily.  Psychiatric/Behavioral: Positive for sleep disturbance. Negative for dysphoric mood. The patient is not nervous/anxious.    Vitals:   06/06/20 0910  BP: 115/79  Pulse: (!) 51  Resp: 18  SpO2: 99%  Weight: 142 lb 8 oz (64.6 kg)  Height: 5\' 7"  (1.702 m)   Wt Readings from Last 3 Encounters:  06/06/20 142 lb 8 oz (64.6 kg)  06/01/20 144 lb 10 oz (65.6 kg)  05/17/20 158 lb (71.7 kg)   Lab Results  Component Value Date    CREATININE 1.65 (H) 06/01/2020   CREATININE 1.88 (H) 05/31/2020   CREATININE 1.67 (H) 05/30/2020    Physical Exam Vitals and nursing note reviewed. Exam conducted with a chaperone present (wife is present).  Constitutional:      Appearance: Normal appearance.  HENT:     Head: Normocephalic and atraumatic.     Right Ear: Decreased hearing noted.     Left Ear: Decreased hearing noted.  Cardiovascular:     Rate and Rhythm: Normal rate and regular rhythm.  Pulmonary:     Effort: Pulmonary effort is normal. No respiratory distress.     Breath sounds: No wheezing or rales.  Abdominal:     General: There is no distension.     Palpations: Abdomen is soft.     Tenderness: There is no abdominal tenderness.  Musculoskeletal:        General: No tenderness.     Cervical back: Normal range of motion and neck supple.     Right lower leg: No edema.     Left lower leg: No edema.  Skin:    General: Skin is warm and dry.  Neurological:  General: No focal deficit present.     Mental Status: He is alert. Mental status is at baseline.  Psychiatric:        Mood and Affect: Mood normal.        Behavior: Behavior normal.     Assessment & Plan:  1: Chronic heart failure with preserved ejection fraction with structural changes (LVH)- - NYHA class II - euvolemic today - weighing daily and wife says that his weight has declined as he's not eating much; reminded to call for an overnight weight gain of > 2 pounds or a weekly weight gain of >5 pounds - weight down 21 pounds from last visit here 3 months ago - not adding any additional salt to his food - saw cardiology Michaelle Birks) 05/17/20 & returns next week - current BP will not allow for addition of entresto - BNP 05/27/20 was 2487.9  2: HTN- - BP looks good today - saw PCP Letitia Libra) 04/24/20 & returns tomorrow - BMP 06/01/20 reviewed and showed sodium 136, potassium 4.3, creatinine 1.65 and GFR 40  3: Paroxysmal atrial fibrillation- - currently  rate controlled - on apixaban and metoprolol tartrate - cardioversion recently done   Patient did not bring his medications nor a list. Each medication was verbally reviewed with the patient and he was encouraged to bring the bottles to every visit to confirm accuracy of list.  Return in 2 months or sooner for any questions/problems before then.

## 2020-06-06 ENCOUNTER — Ambulatory Visit: Payer: Medicare Other | Attending: Family | Admitting: Family

## 2020-06-06 ENCOUNTER — Encounter: Payer: Self-pay | Admitting: Family

## 2020-06-06 ENCOUNTER — Other Ambulatory Visit: Payer: Self-pay

## 2020-06-06 VITALS — BP 115/79 | HR 51 | Resp 18 | Ht 67.0 in | Wt 142.5 lb

## 2020-06-06 DIAGNOSIS — N183 Chronic kidney disease, stage 3 unspecified: Secondary | ICD-10-CM | POA: Insufficient documentation

## 2020-06-06 DIAGNOSIS — F0390 Unspecified dementia without behavioral disturbance: Secondary | ICD-10-CM | POA: Insufficient documentation

## 2020-06-06 DIAGNOSIS — I5032 Chronic diastolic (congestive) heart failure: Secondary | ICD-10-CM | POA: Diagnosis present

## 2020-06-06 DIAGNOSIS — I130 Hypertensive heart and chronic kidney disease with heart failure and stage 1 through stage 4 chronic kidney disease, or unspecified chronic kidney disease: Secondary | ICD-10-CM | POA: Insufficient documentation

## 2020-06-06 DIAGNOSIS — E039 Hypothyroidism, unspecified: Secondary | ICD-10-CM | POA: Diagnosis not present

## 2020-06-06 DIAGNOSIS — E785 Hyperlipidemia, unspecified: Secondary | ICD-10-CM | POA: Insufficient documentation

## 2020-06-06 DIAGNOSIS — Z87891 Personal history of nicotine dependence: Secondary | ICD-10-CM | POA: Insufficient documentation

## 2020-06-06 DIAGNOSIS — I480 Paroxysmal atrial fibrillation: Secondary | ICD-10-CM | POA: Diagnosis not present

## 2020-06-06 DIAGNOSIS — Z8249 Family history of ischemic heart disease and other diseases of the circulatory system: Secondary | ICD-10-CM | POA: Insufficient documentation

## 2020-06-06 DIAGNOSIS — I10 Essential (primary) hypertension: Secondary | ICD-10-CM

## 2020-06-06 DIAGNOSIS — Z7901 Long term (current) use of anticoagulants: Secondary | ICD-10-CM | POA: Diagnosis not present

## 2020-06-06 DIAGNOSIS — Z79890 Hormone replacement therapy (postmenopausal): Secondary | ICD-10-CM | POA: Insufficient documentation

## 2020-06-06 NOTE — Patient Instructions (Signed)
Continue weighing daily and call for an overnight weight gain of > 2 pounds or a weekly weight gain of >5 pounds. 

## 2020-06-13 ENCOUNTER — Ambulatory Visit (INDEPENDENT_AMBULATORY_CARE_PROVIDER_SITE_OTHER): Payer: Medicare Other | Admitting: Physician Assistant

## 2020-06-13 ENCOUNTER — Encounter: Payer: Self-pay | Admitting: Physician Assistant

## 2020-06-13 ENCOUNTER — Other Ambulatory Visit: Payer: Self-pay

## 2020-06-13 VITALS — BP 110/64 | HR 39 | Ht 67.0 in | Wt 144.0 lb

## 2020-06-13 DIAGNOSIS — I480 Paroxysmal atrial fibrillation: Secondary | ICD-10-CM

## 2020-06-13 DIAGNOSIS — Z8639 Personal history of other endocrine, nutritional and metabolic disease: Secondary | ICD-10-CM

## 2020-06-13 DIAGNOSIS — Z9889 Other specified postprocedural states: Secondary | ICD-10-CM | POA: Diagnosis not present

## 2020-06-13 DIAGNOSIS — N1831 Chronic kidney disease, stage 3a: Secondary | ICD-10-CM

## 2020-06-13 DIAGNOSIS — I5032 Chronic diastolic (congestive) heart failure: Secondary | ICD-10-CM

## 2020-06-13 DIAGNOSIS — Z79899 Other long term (current) drug therapy: Secondary | ICD-10-CM | POA: Diagnosis not present

## 2020-06-13 DIAGNOSIS — Z7901 Long term (current) use of anticoagulants: Secondary | ICD-10-CM

## 2020-06-13 DIAGNOSIS — E785 Hyperlipidemia, unspecified: Secondary | ICD-10-CM

## 2020-06-13 DIAGNOSIS — R0600 Dyspnea, unspecified: Secondary | ICD-10-CM

## 2020-06-13 DIAGNOSIS — R0609 Other forms of dyspnea: Secondary | ICD-10-CM

## 2020-06-13 MED ORDER — AMIODARONE HCL 200 MG PO TABS: 200.0000 mg | ORAL_TABLET | Freq: Two times a day (BID) | ORAL | Status: DC

## 2020-06-13 NOTE — Progress Notes (Signed)
Office Visit    Patient Name: Robert Frye Date of Encounter: 06/13/2020  Primary Care Provider:  Baxter Hire, MD Primary Cardiologist:  Robert Sacramento, MD  Chief Complaint    Chief Complaint  Patient presents with  . Hospitalization Follow-up    s/p cardioversion-Patient's wife states he has lost his appetite and has been very fatigued.    87 year old male with history of hypertension, hyperlipidemia, CKD 3, mild dementia, and evaluated today for follow-up after DCCV with report of weakness and fatigue and bradycardia noted.  Past Medical History    Past Medical History:  Diagnosis Date  . (HFpEF) heart failure with preserved ejection fraction ([place])   . CKD (chronic kidney disease), stage III ([place])   . Dementia ([place])   . History of stress test    Robert. 03/2020 MV: No ischemia/infarct. EF 40% (55-60% by echo 01/2020).  Marland Kitchen Hyperlipidemia   . Hypertension   . Hypertrophic cardiomyopathy ([place])    Robert. 01/2020 Echo: EF 55-60%, no rwma. Sev eccentric LVH of apical and basal-septal segments. gr1 DD. Mildly enlarged RV w/ nl RV fxn. sev dil LA, mod dil RA. Mild MR.  Marland Kitchen Hypothyroidism   . Persistent atrial fibrillation ([place])    Robert. Dx 01/2020 s/p DCCV 02/2020; b. 02/2020 Recurrent Afib noted-->rate controlled-->amio started 05/2020 in setting of ongoing CHF; c. CHA2DS2VASc = 4-->Eliquis 2.5 BID.   Past Surgical History:  Procedure Laterality Date  . CARDIOVERSION N/Robert 03/14/2020   Procedure: CARDIOVERSION;  Surgeon: Robert Hampshire, MD;  Location: [facility] ORS;  Service: Cardiovascular;  Laterality: N/Robert;  . CARDIOVERSION N/Robert 05/31/2020   Procedure: CARDIOVERSION;  Surgeon: Robert Sable, MD;  Location: [facility] ORS;  Service: Cardiovascular;  Laterality: N/Robert;    Allergies  No Known Allergies  History of Present Illness    Robert Frye is Robert 87 y.o. male with PMH as above.    He was admitted to [facility] and noted to be in atrial fibrillation with RVR, volume overloaded, and HS  Tn elevated thought 2/2 supply demand ischemia. 02/14/2020 echo showed EF 55 to 60%, NRWMA, severe eccentric left ventricular hypertrophy of the apical and basal septal segments, G1DD, mild RVE, severe LAE, moderate RAE, mild MR. Findings were noted to be consistent with hypertrophic obstructive cardiomyopathy on echo report.  He was IV diuresed with slight bump in renal function, improved after holding IV Lasix.  He was discharged on oral Lasix 20 mg daily, rate control, and reduced dose Eliquis. Possible outpatient cardiac MRI after OP cardioversion was recommended.   Since that time, he has been seen in the ED and admitted due to volume overload and with need for changes to outpatient medications to prevent volume overload.  Several adjustments to both oral outpatient diuresis and potassium supplementation have been required with frequent labs.  He has also been following with the heart failure clinic.  DCCV 12/21 performed with restoration of NSR.    Seen by Dr. Garen Frye 03/20/20 and again volume overloaded.  Lasix discontinued.  Started on torsemide 60m daily.  Seen again in clinic with report of improved symptoms and volume status. He reported occasionally productive cough with gray sputum.  He had Robert feeling of heaviness in his head for Robert brief period of time in the mornings.  His wife also noted increasing confusion, malaise/fatigue/listlessness.  BP soft.  Case discussed with primary cardiologist, Dr. AFletcher Frye recommendation for MPI.    He underwent MPI 04/12/2020 that was ruled in intermediate risk  study due to moderately reduced EF at 40% with consideration of atrial fibrillation rhythm.  Echo correlation advised with recent echo showing normal EF.  No evidence of ischemia was noted.    In January 2022, he struggled with volume status, low BP, DOE, and weakness.  Case discussed with Dr. Garen Frye with recommendation to reduce to torsemide $RemoveBefo'20mg'qFnsLSOWzvB$  daily and KCL tab 1-mEq, reduce lopressor to  12.$Remo'4mg'YYcNb$  BID. Seen again 2/23. Case discussed with primary cardiologist and decision to start on amiodarone load and plan for DCCV. He was started on amiodarone $RemoveBefor'200mg'inZkNZDlbThH$  BID. Plan was for follow-up with Dr. Fletcher Frye in 2 weeks.  05/25/20 phone note indicates increase in Metoprolol tartrate to $RemoveBef'25mg'zUhSSsBKhZ$  daily for tachycardia.    He then presented to the ED 3/5 and was admitted over the weekend for decline over the last week. He was noted to be volume overloaded with pleural effusion. Echo showed LVEF 50 to 55%, severe concentric LVH, indeterminate diastolic parameters, RV normal size and function, moderate pleural effusion in left lateral region, mild MR.   He was cardioverted 3/9 while on amiodarone $RemoveBefor'400mg'DIXseaiyUCdl$  BID then recommendation was for $Remov'200mg'QybxxC$  BID x7 days then $RemoveB'200mg'boPnHpwt$  daily with close follow-up given risk of recurrent arrhythmia and dilated LA. He was transitioned back to torsemide $RemoveBefo'10mg'LRSzwrkRLlM$  and lopressor 12.$RemoveBeforeDE'5mg'ShphypOwRzqNJKC$  BID. SNF recommended and deferred. Thoracentesis was held given improvement in respiratory status and size of effusion with diuresis. He was discharged with home health, though this does not appear yet followed up on.   Seen in follow-up by Robert Price, NP at the [facility] heart failure clinic 06/06/2020 noting intermittent lightheadedness though overall feeling improved since hospitalization.  Lab work 06/07/2020 via [facility]:  NA 141, K3.8, BUN 27, creatinine 2.3, GFR 27  Today, he returns to clinic and noted to be on amiodarone $RemoveBefor'400mg'CJoEKRxnBVty$  BID at discharge, rather than the recommended dose by cardiology of $RemoveBefor'200mg'MTFqZwQJKEwU$  BID. He is severely bradycardic with rates in the 30s and EKG reviewed with Dr. Fletcher Frye. He reports no energy, fatigue, dizziness both with exertion and at rest. Since discharge, he has had recent falls, but fortunately, he did not hit head. He did fall on wife with bruise on her forearm (left side). He has Robert bruise under his left arm but otherwise tolerated the falls. He reports orange urine. He is uninterested in  eating or drinking.  States his wife's iced tea is now very sweet and less appealing. No CP. Breathing improved. Main sx include dizziness, weakness,  Fatigue, and falls. No reported s/sx of bleeding. No LEE or other s/sx of volume overload.  Home Medications    Outpatient Encounter Medications as of 06/13/2020  Medication Sig  . apixaban (ELIQUIS) 2.5 MG TABS tablet Take 1 tablet (2.5 mg total) by mouth 2 (two) times daily.  Marland Kitchen atorvastatin (LIPITOR) 10 MG tablet Take 10 mg by mouth daily.  . Cholecalciferol (DIALYVITE VITAMIN D 5000) 125 MCG (5000 UT) capsule Take 5,000 Units by mouth daily.  . cyanocobalamin 1000 MCG tablet Take 1,000 mcg by mouth daily.  Marland Kitchen levothyroxine (SYNTHROID) 75 MCG tablet Take 75 mcg by mouth daily before breakfast.  . Naphazoline HCl (CLEAR EYES OP) Place 1 drop into both eyes daily as needed (dry/itchy eyes).  . potassium chloride SA (KLOR-CON) 10 MEQ tablet Take 1 tablet (10 mEq total) by mouth daily.  Marland Kitchen torsemide (DEMADEX) 10 MG tablet Take 1 tablet (10 mg total) by mouth daily.  . [DISCONTINUED] amiodarone (PACERONE) 400 MG tablet Take 1 tablet (400 mg total)  by mouth 2 (two) times daily.  . [DISCONTINUED] metoprolol tartrate (LOPRESSOR) 25 MG tablet Take 0.5 tablets (12.5 mg total) by mouth 2 (two) times daily.  Marland Kitchen amiodarone (PACERONE) 200 MG tablet Take 1 tablet (200 mg total) by mouth 2 (two) times daily.   No facility-administered encounter medications on file as of 06/13/2020.   Review of Systems    He denies chest pain, palpitations, shortness of breath, dyspnea, lower extremity edema, pnd, n, v, syncope, weight gain, or early satiety.  He is still using 1 pillow at night.  He continues to note confusion/memory issues and reduced energy.  He has dizziness, weakness, fatigue, and frequent falls. All other systems reviewed and are otherwise negative except as noted above.  Physical Exam    VS:  BP 110/64 (BP Location: Left Arm, Patient Position: Sitting,  Cuff Size: Normal)   Pulse (!) 39   Ht _0  (1.702 m)   Wt 144 lb (65.3 kg)   SpO2 96%   BMI 22.55 kg/m  , BMI Body mass index is 22.55 kg/m. GEN: Elderly gentleman, in no acute distress.  Joined by his wife. HEENT: normal. Neck: Supple, no JVD, carotid bruits, or masses. Cardiac: Bradycardic but regular. no murmurs, rubs, or gallops. No clubbing, cyanosis.. No lower extremity edema with wrinkles, radials/DP/PT 2+ and equal bilaterally.  Respiratory: CTAB.  Poor inspiratory effort.  Respirations regular and unlabored. GI: Soft, nontender, nondistended, BS + x 4. MS: no deformity or atrophy. Skin: warm and dry, no rash. Neuro:  Strength and sensation are intact. Psych: Normal affect.  Accessory Clinical Findings    ECG personally reviewed by me today -marked sinus bradycardia, 39 bpm, first-degree AV block with PR interval 228 ms, QRS 100 ms, QTC 495 ms, left axis deviation, repolarization abnormalities, reviewed with Dr. Fletcher Frye same day- no acute changes.  VITALS Reviewed today   Temp Readings from Last 3 Encounters:  06/01/20 97.7 F (36.5 C) (Oral)  03/14/20 97.8 F (36.6 C) (Oral)  02/25/20 97.6 F (36.4 C) (Oral)   BP Readings from Last 3 Encounters:  06/13/20 110/64  06/06/20 115/79  06/01/20 (!) 143/80   Pulse Readings from Last 3 Encounters:  06/13/20 (!) 39  06/06/20 (!) 51  06/01/20 64    Wt Readings from Last 3 Encounters:  06/13/20 144 lb (65.3 kg)  06/06/20 142 lb 8 oz (64.6 kg)  06/01/20 144 lb 10 oz (65.6 kg)     LABS  reviewed today   Lab Results  Component Value Date   WBC 10.6 (H) 06/01/2020   HGB 15.0 06/01/2020   HCT 44.4 06/01/2020   MCV 99.1 06/01/2020   PLT 218 06/01/2020   Lab Results  Component Value Date   CREATININE 1.65 (H) 06/01/2020   BUN 27 (H) 06/01/2020   NA 136 06/01/2020   K 4.3 06/01/2020   CL 102 06/01/2020   CO2 23 06/01/2020   Lab Results  Component Value Date   ALT 45 (H) 06/01/2020   AST 30 06/01/2020    ALKPHOS 64 06/01/2020   BILITOT 2.6 (H) 06/01/2020   Lab Results  Component Value Date   CHOL 139 02/14/2020   HDL 42 02/14/2020   LDLCALC 85 02/14/2020   TRIG 60 02/14/2020   CHOLHDL 3.3 02/14/2020    Lab Results  Component Value Date   HGBA1C 5.7 (H) 02/14/2020   Lab Results  Component Value Date   TSH 3.088 05/27/2020     STUDIES/PROCEDURES reviewed today  Echo 05/29/20 1. Left ventricular ejection fraction, by estimation, is 50 to 55%. The  left ventricle has low normal function. Left ventricular endocardial  border not optimally defined to evaluate regional wall motion. There is  severe concentric left ventricular  hypertrophy. Left ventricular diastolic parameters are indeterminate.  Small cavity size.  2. Right ventricular systolic function is normal. The right ventricular  size is normal. Tricuspid regurgitation signal is inadequate for assessing  PA pressure.  3. Moderate pleural effusion in the left lateral region.  4. The mitral valve is normal in structure. Mild mitral valve  regurgitation. No evidence of mitral stenosis.  5. The aortic valve is normal in structure. Aortic valve regurgitation is  not visualized. No aortic stenosis is present.  6. The inferior vena cava is dilated in size with <50% respiratory  variability, suggesting right atrial pressure of 15 mmHg.   MPI 04/12/2020  This is an intermediate risk study due to moderately reduced EF  The left ventricular ejection fraction is moderately decreased (40%). Echo correlation advised.  There is no evidence for ischemia  Rhythm was atrial fibrillation   Echo 02/14/20 1. Left ventricular ejection fraction, by estimation, is 55 to 60%. The  left ventricle has normal function. The left ventricle has no regional  wall motion abnormalities. There is severe eccentric left ventricular  hypertrophy of the apical and  basal-septal segments. Left ventricular diastolic parameters are  consistent  with Grade I diastolic dysfunction (impaired relaxation).  2. Right ventricular systolic function is normal. The right ventricular  size is mildly enlarged.  3. Left atrial size was severely dilated.  4. Right atrial size was moderately dilated.  5. The mitral valve is normal in structure. Mild mitral valve  regurgitation. No evidence of mitral stenosis.  6. The aortic valve is normal in structure. Aortic valve regurgitation is  not visualized. No aortic stenosis is present.  7. The inferior vena cava is normal in size with greater than 50%  respiratory variability, suggesting right atrial pressure of 3 mmHg.  Conclusion(s)/Recommendation(s): Findings consistent with hypertrophic  obstructive cardiomyopathy.   Assessment & Plan    Chronic diastolic CHF Hypertrophic Cardiomyopathy / Echo findings consistent with HOCM --Diuresis complicated by recurrent Afib, CM /HOCM per echo, dizziness, and recent Cr/K.  01/2020 echo EF nl, G1DD, severe eccentric LVH and noted as consistent with HOCM. Given echo, cMRI recommended as OP but not yet performed.  03/2020 MPI for CP and EKG changes performed and ruled intermediate risk due to reduced EF and atrial fibrillation but without evidence of ischemia.  Most recent echo as above during admission for decompensation with EF 50-55%, ongoing severe LVH, moderate pleural effusion, mild MR, RAP 59mhg  Continue torsemide 175mdaily with KCl tab.    Discontinue Lopressor 12.5 mg BID.   Reduced to amiodarone 20024mID then 200m5mily  Call this week see how he dose with thee changes in Robert few days and follow-up closely.   Amiodarone monitoring labs: TSH, FT4, LFTs. Monitor with periodic CXR, PFTs, eye checks, and as recommended per guidelines.  Persistent atrial fibrillation on OAC [place] reported tachypalpitations. Recurrent Afib s/p DCCV 12/21 and 05/2020. Complaint with reduced dose OAC. CHA2DS2-VASc score of at least 4 (CHF, hypertension, age x2).  Bradycardic today with rates in the 30s and EKG reviewed by Dr. AridFletcher Anoniscontinue Lopressor 12.5 BID for rate control.   Reduce to amiodarone 200mg83m x1 week, then 200mg 77my.  Continue Eliquis 2.5mg BI26mas he qualifies  for reduced dose Eliquis based on age and renal function.   HTN, BP goal 130/80 or lower --Medication changes as above for soft BP today.  HLD --Continue Lipitor.  Orange urine, pt reported --Contact PCP if this continues. Will check labs today to rule out drop in H&H or abnormal LFTs.   Medication changes: Stop BB/ metoprolol. Reduced amiodarone with taper.  Labs ordered: C-Met, TSH, FT4, CBC Future considerations: Cardiac MRI. Heart failure clinic in [place] Disposition: RTC 1-2 weeks  Arvil Chaco, PA-C 06/13/2020

## 2020-06-13 NOTE — Patient Instructions (Signed)
Medication Instructions:  Your physician has recommended you make the following change in your medication:   DECREASE Amiodarone to 200mg  TWICE daily   STOP Metoprolol   *If you need a refill on your cardiac medications before your next appointment, please call your pharmacy*   Lab Work: Your physician recommends that you have lab work TODAY: Cmet, CBC, TSH, Free T4  If you have labs (blood work) drawn today and your tests are completely normal, you will receive your results only by: MyChart Message (if you have MyChart) OR . A paper copy in the mail If you have any lab test that is abnormal or we need to change your treatment, we will call you to review the results.   Testing/Procedures: None ordered   Follow-Up: At [facility], you and your health needs are our priority.  As part of our continuing mission to provide you with exceptional heart care, we have created designated Provider Care Teams.  These Care Teams include your primary Cardiologist (physician) and Advanced Practice Providers (APPs -  Physician Assistants and Nurse Practitioners) who all work together to provide you with the care you need, when you need it.  We recommend signing up for the patient portal called "MyChart".  Sign up information is provided on this After Visit Summary.  MyChart is used to connect with patients for Virtual Visits (Telemedicine).  Patients are able to view lab/test results, encounter notes, upcoming appointments, etc.  Non-urgent messages can be sent to your provider as well.   To learn more about what you can do with MyChart, go to [facility].    Your next appointment:   2 week(s)  The format for your next appointment:   In Person  Provider:   [email or web address], PA-C   Other Instructions  If orange urine continues after we have called with your lab results, please call your primary care provider.

## 2020-06-14 ENCOUNTER — Ambulatory Visit: Payer: Medicare Other | Admitting: Physician Assistant

## 2020-06-14 ENCOUNTER — Telehealth: Payer: Self-pay | Admitting: *Deleted

## 2020-06-14 LAB — COMPREHENSIVE METABOLIC PANEL
ALT: 32 IU/L (ref 0–44)
AST: 33 IU/L (ref 0–40)
Albumin/Globulin Ratio: 1.6 (ref 1.2–2.2)
Albumin: 4.4 g/dL (ref 3.6–4.6)
Alkaline Phosphatase: 84 IU/L (ref 44–121)
BUN/Creatinine Ratio: 12 (ref 10–24)
BUN: 28 mg/dL — ABNORMAL HIGH (ref 8–27)
Bilirubin Total: 2.2 mg/dL — ABNORMAL HIGH (ref 0.0–1.2)
CO2: 20 mmol/L (ref 20–29)
Calcium: 10 mg/dL (ref 8.6–10.2)
Chloride: 92 mmol/L — ABNORMAL LOW (ref 96–106)
Creatinine, Ser: 2.37 mg/dL — ABNORMAL HIGH (ref 0.76–1.27)
Globulin, Total: 2.8 g/dL (ref 1.5–4.5)
Glucose: 95 mg/dL (ref 65–99)
Potassium: 4.3 mmol/L (ref 3.5–5.2)
Sodium: 136 mmol/L (ref 134–144)
Total Protein: 7.2 g/dL (ref 6.0–8.5)
eGFR: 26 mL/min/{1.73_m2} — ABNORMAL LOW (ref >59)

## 2020-06-14 LAB — CBC
Hematocrit: 47 % (ref 37.5–51.0)
Hemoglobin: 16.2 g/dL (ref 13.0–17.7)
MCH: 33.2 pg — ABNORMAL HIGH (ref 26.6–33.0)
MCHC: 34.5 g/dL (ref 31.5–35.7)
MCV: 96 fL (ref 79–97)
Platelets: 222 10*3/uL (ref 150–450)
RBC: 4.88 x10E6/uL (ref 4.14–5.80)
RDW: 13.6 % (ref 11.6–15.4)
WBC: 7.9 10*3/uL (ref 3.4–10.8)

## 2020-06-14 LAB — T4, FREE: Free T4: 2.7 ng/dL — ABNORMAL HIGH (ref 0.82–1.77)

## 2020-06-14 LAB — TSH: TSH: 4.45 u[IU]/mL (ref 0.450–4.500)

## 2020-06-14 MED ORDER — POTASSIUM CHLORIDE CRYS ER 10 MEQ PO TBCR: 10.0000 meq | EXTENDED_RELEASE_TABLET | ORAL | 5 refills | Status: DC

## 2020-06-14 MED ORDER — TORSEMIDE 10 MG PO TABS
10.0000 mg | ORAL_TABLET | ORAL | 5 refills | Status: DC
Start: 2020-06-14 — End: 2020-06-27

## 2020-06-14 NOTE — Telephone Encounter (Signed)
Spoke to pt's wife Tamela Oddi (DPR), notified of lab results and provider's recc.  Betsy verbalized understanding. Pt will decr Torsemide and Potassium to every other day. Aware we will recheck at RTC. No further questions at this time.

## 2020-06-14 NOTE — Telephone Encounter (Signed)
-----   Message from Lennon Alstrom, [place] sent at 06/14/2020  2:04 PM EDT ----- Please let Robert Frye know labs showed --Liver function normal. Free T4 elevated above normal range but similar to previous labs and not concerning, given her normal TSH. --Blood counts stable. --Kidney function worse from previous labs.  Recommend reducing frequency of torsemide and potassium to every other day.  He has not been eating or drinking very much lately, so he does not have as much fluid to pull off of him.   --Potassium at goal. --We will recheck at RTC.

## 2020-06-27 ENCOUNTER — Ambulatory Visit (INDEPENDENT_AMBULATORY_CARE_PROVIDER_SITE_OTHER): Payer: Medicare Other | Admitting: Physician Assistant

## 2020-06-27 ENCOUNTER — Other Ambulatory Visit: Payer: Self-pay

## 2020-06-27 ENCOUNTER — Encounter: Payer: Self-pay | Admitting: Physician Assistant

## 2020-06-27 VITALS — BP 114/56 | HR 68 | Ht 67.0 in | Wt 144.0 lb

## 2020-06-27 DIAGNOSIS — R0600 Dyspnea, unspecified: Secondary | ICD-10-CM

## 2020-06-27 DIAGNOSIS — Z79899 Other long term (current) drug therapy: Secondary | ICD-10-CM

## 2020-06-27 DIAGNOSIS — I480 Paroxysmal atrial fibrillation: Secondary | ICD-10-CM | POA: Diagnosis not present

## 2020-06-27 DIAGNOSIS — I5032 Chronic diastolic (congestive) heart failure: Secondary | ICD-10-CM

## 2020-06-27 DIAGNOSIS — R0609 Other forms of dyspnea: Secondary | ICD-10-CM

## 2020-06-27 DIAGNOSIS — E785 Hyperlipidemia, unspecified: Secondary | ICD-10-CM

## 2020-06-27 MED ORDER — AMIODARONE HCL 200 MG PO TABS: 200.0000 mg | ORAL_TABLET | Freq: Every day | ORAL | 0 refills | Status: DC

## 2020-06-27 MED ORDER — FUROSEMIDE 20 MG PO TABS: 10.0000 mg | ORAL_TABLET | Freq: Every day | ORAL | 1 refills | Status: DC

## 2020-06-27 NOTE — Patient Instructions (Addendum)
Medication Instructions:  - Your physician has recommended you make the following change in your medication:   1) DECREASE amiodarone 200 mg- take 1 tablet by mouth once daily  2) STOP torsemide  3) START lasix (furosemide) 20 mg- take 0.5 tablet (10 mg) by mouth once daily    *If you need a refill on your cardiac medications before your next appointment, please call your pharmacy*   Lab Work: - none ordered  If you have labs (blood work) drawn today and your tests are completely normal, you will receive your results only by: Marland Kitchen MyChart Message (if you have MyChart) OR . A paper copy in the mail If you have any lab test that is abnormal or we need to change your treatment, we will call you to review the results.   Testing/Procedures: - none ordered   Follow-Up: At [facility], you and your health needs are our priority.  As part of our continuing mission to provide you with exceptional heart care, we have created designated Provider Care Teams.  These Care Teams include your primary Cardiologist (physician) and Advanced Practice Providers (APPs -  Physician Assistants and Nurse Practitioners) who all work together to provide you with the care you need, when you need it.  We recommend signing up for the patient portal called "MyChart".  Sign up information is provided on this After Visit Summary.  MyChart is used to connect with patients for Virtual Visits (Telemedicine).  Patients are able to view lab/test results, encounter notes, upcoming appointments, etc.  Non-urgent messages can be sent to your provider as well.   To learn more about what you can do with MyChart, go to [email or web address].    Your next appointment:   1-2  month(s)  The format for your next appointment:   In Person  Provider:   You may see [name], MD or one of the following Advanced Practice Providers on your designated Care Team:    Nicolasa Ducking, NP  Eula Listen, PA-C  Marisue Ivan, PA-C  [place], [place]  Gillian Shields, NP    Other Instructions Think about a referral to our advanced heart failure clinic in [place].  They specialize in advanced heart failure and associated medications.  For reference, they are located at:  [address] [place],  [place]  [address] Get Driving Directions Main: [phone]

## 2020-06-27 NOTE — Progress Notes (Signed)
Office Visit    Patient Name: Robert Frye Date of Encounter: 06/27/2020  Primary Care Provider:  Gracelyn NurseJohnston, John D, MD Primary Cardiologist:  Lorine BearsMuhammad Arida, MD  Chief Complaint    Chief Complaint  Patient presents with  . Follow-up    2 week F/U    87 year old male with history of atrial fibrillation s/p DCCV on amiodarone, HFpEF/HCM, PHTN, hypertension, hyperlipidemia, CKD 3, hypothyroidism, mild dementia, and evaluated today for 2 week follow-up after recent admission and cardioversion on amiodarone.  Past Medical History    Past Medical History:  Diagnosis Date  . (HFpEF) heart failure with preserved ejection fraction (HCC)   . CKD (chronic kidney disease), stage III (HCC)   . Dementia (HCC)   . History of stress test    a. 03/2020 MV: No ischemia/infarct. EF 40% (55-60% by echo 01/2020).  Marland Kitchen. Hyperlipidemia   . Hypertension   . Hypertrophic cardiomyopathy (HCC)    a. 01/2020 Echo: EF 55-60%, no rwma. Sev eccentric LVH of apical and basal-septal segments. gr1 DD. Mildly enlarged RV w/ nl RV fxn. sev dil LA, mod dil RA. Mild MR.  Marland Kitchen. Hypothyroidism   . Persistent atrial fibrillation (HCC)    a. Dx 01/2020 s/p DCCV 02/2020; b. 02/2020 Recurrent Afib noted-->rate controlled-->amio started 05/2020 in setting of ongoing CHF; c. CHA2DS2VASc = 4-->Eliquis 2.5 BID.   Past Surgical History:  Procedure Laterality Date  . CARDIOVERSION N/A 03/14/2020   Procedure: CARDIOVERSION;  Surgeon: Iran OuchArida, Muhammad A, MD;  Location: [facility] ORS;  Service: Cardiovascular;  Laterality: N/A;  . CARDIOVERSION N/A 05/31/2020   Procedure: CARDIOVERSION;  Surgeon: Debbe OdeaAgbor-Etang, Brian, MD;  Location: [facility] ORS;  Service: Cardiovascular;  Laterality: N/A;  . HERNIA REPAIR      Allergies  No Known Allergies  History of Present Illness    Robert Frye is a 87 y.o. male with PMH as above.    He was admitted to [facility] and noted to be in atrial fibrillation with RVR, volume overloaded, and HS Tn  elevated thought 2/2 supply demand ischemia. 02/14/2020 echo showed EF 55 to 60%, NRWMA, severe eccentric left ventricular hypertrophy of the apical and basal septal segments, G1DD, mild RVE, severe LAE, moderate RAE, mild MR. Findings were noted to be consistent with hypertrophic obstructive cardiomyopathy on echo report.  He was IV diuresed with slight bump in renal function, improved after holding IV Lasix.  He was discharged on oral Lasix 20 mg daily, rate control, and reduced dose Eliquis. Possible outpatient cardiac MRI after OP cardioversion was recommended.   Since that time, he has been seen in the ED and admitted due to volume overload and with need for changes to outpatient medications to prevent volume overload.  Several adjustments to both oral outpatient diuresis and potassium supplementation have been required with frequent labs.  He has also been following with the heart failure clinic.  DCCV 12/21 performed with restoration of NSR.    Seen by Dr. Azucena CecilAgbor-Etang 03/20/20 and again volume overloaded.  Lasix discontinued.  Started on torsemide 20mg  daily.  Seen again in clinic with report of improved symptoms and volume status. He reported occasionally productive cough with gray sputum.  He had a feeling of heaviness in his head for a brief period of time in the mornings.  His wife also noted increasing confusion, malaise/fatigue/listlessness.  BP soft.  Case discussed with primary cardiologist, Dr. Kirke CorinArida and recommendation for MPI.    He underwent MPI 04/12/2020 that was ruled in intermediate  risk study due to moderately reduced EF at 40% with consideration of atrial fibrillation rhythm.  Echo correlation advised with recent echo showing normal EF.  No evidence of ischemia was noted.    In January 2022, he struggled with volume status, low BP, DOE, and weakness.  Case discussed with Dr. Azucena Cecil with recommendation to reduce to torsemide 20mg  daily and KCL tab 1-mEq, reduce lopressor to  12.4mg  BID. Seen again 2/23. Case discussed with primary cardiologist and decision to start on amiodarone load and plan for DCCV. He was started on amiodarone 200mg  BID. Plan was for follow-up with Dr. 3/23 in 2 weeks.  05/25/20 phone note indicates increase in Metoprolol tartrate to 25mg  daily for tachycardia.    He then presented to the ED 3/5 and was admitted over the weekend for decline over the last week. He was noted to be volume overloaded with pleural effusion. Echo showed LVEF 50 to 55%, severe concentric LVH, indeterminate diastolic parameters, RV normal size and function, moderate pleural effusion in left lateral region, mild MR.   He was cardioverted 3/9 while on amiodarone 400mg  BID then recommendation was for 200mg  BID x7 days then 200mg  daily with close follow-up given risk of recurrent arrhythmia and dilated LA. He was transitioned back to torsemide 10mg  and lopressor 12.5mg  BID. SNF recommended and deferred. Thoracentesis was held given improvement in respiratory status and size of effusion with diuresis. He was discharged with home health, though this does not appear yet followed up on.   Seen in follow-up by 07/25/20, NP at the [facility] heart failure clinic 06/06/2020 noting intermittent lightheadedness though overall feeling improved since hospitalization.  Seen 3/22 and feeling poorly. It was noted he was bradycardic and hypotensive. He had been discharged on amiodarone 400mg  BID, rather than 200mg  BID. He had no energy, fatigue, weakness, and dizziness both with exertion and at rest. Since discharge, he has had recent falls, but fortunately, he did not hit head. He did fall on wife with bruise on her forearm (left side). He had a bruise under his left arm but otherwise tolerated the falls. He was uninterested in eating or drinking.  No LEE or other s/sx of volume overload. Amiodarone taper was started and amiodarone labs completed and wnl. Metoprolol was discontinued.   Today, 06/27/20,  he returns to clinic and is improved from his previous visit. His wife still reports he has a heavy feeling in his head.  He has ongoing confusion/memory issues and reduced energy. She notes continued though improved dizziness, weakness, and fatigue. Since his medication changes as above, he has not had any further falls. BP at home well controlled per wife. She states that he is still unable to do much each day and thus is sedentary. She is concerned regarding the return of swelling in his lower extremities with some ankle swelling noted and L ankle worse than that of R ankle. No abdominal swelling. Breathing stable. He is still only using 1 pillow at night. No CP, racing HR, palpitations. No further syncope. His wife reports that they recently took his urine in for testing and culture due to her concern for UTI and it was negative. No s/sx of bleeding. Medication compliance reported.  Home Medications    Outpatient Encounter Medications as of 06/27/2020  Medication Sig  . amiodarone (PACERONE) 200 MG tablet Take 1 tablet (200 mg total) by mouth 2 (two) times daily.  apixaban (ELIQUIS) 2.5 MG TABS tablet Take 1 tablet (2.5 mg total) by  mouth 2 (two) times daily.  Marland Kitchen atorvastatin (LIPITOR) 10 MG tablet Take 10 mg by mouth daily.  . Cholecalciferol (DIALYVITE VITAMIN D 5000) 125 MCG (5000 UT) capsule Take 5,000 Units by mouth daily.  . cyanocobalamin 1000 MCG tablet Take 1,000 mcg by mouth daily.  Marland Kitchen levothyroxine (SYNTHROID) 75 MCG tablet Take 75 mcg by mouth daily before breakfast.  . Naphazoline HCl (CLEAR EYES OP) Place 1 drop into both eyes daily as needed (dry/itchy eyes).  . potassium chloride (KLOR-CON) 10 MEQ tablet Take 1 tablet (10 mEq total) by mouth every other day.  . torsemide (DEMADEX) 10 MG tablet Take 1 tablet (10 mg total) by mouth every other day.   No facility-administered encounter medications on file as of 06/27/2020.   Review of Systems    He denies chest pain, palpitations,  shortness of breath, dyspnea, pnd, n, v, syncope, weight gain, or early satiety. He has LEE with L>R. He is still using 1 pillow at night.  He has ongoing confusion/memory issues.  He has improved but ongoing dizziness, weakness, fatigue. He is sedentary. Resolution of previous falls. All other systems reviewed and are otherwise negative except as noted above.  Physical Exam    VS:  BP (!) 114/56 (BP Location: Right Arm, Patient Position: Sitting, Cuff Size: Normal)   Pulse 68   Ht  (1.702 m)   Wt 144 lb (65.3 kg)   SpO2 95%   BMI 22.55 kg/m  , BMI Body mass index is 22.55 kg/m. GEN: Elderly gentleman, in no acute distress.  Joined by his wife. HEENT: normal. Neck: Supple, no JVD, carotid bruits, or masses. Cardiac: RRR. 1/6 systolic murmur. No rubs, or gallops. No clubbing, cyanosis. 1+ bilateral edema up to mid tibia with L>R, radials/DP/PT 2+ and equal bilaterally.  Respiratory: CTAB.   Respirations regular and unlabored. GI: Soft, nontender, nondistended, BS + x 4. MS: no deformity or atrophy. Skin: warm and dry, no rash. Neuro:  Strength and sensation are intact. Psych: Normal affect.  Accessory Clinical Findings    ECG personally reviewed by me today -Sinus rhythm with first-degree AV block, 68 bpm, PR interval 224 ms, left axis deviation with left ventricular hypertrophy and repolarization abnormalities, poor R wave progression noted in inferior leads, QTC 497  VITALS Reviewed today   Temp Readings from Last 3 Encounters:  06/01/20 97.7 F (36.5 C) (Oral)  03/14/20 97.8 F (36.6 C) (Oral)  02/25/20 97.6 F (36.4 C) (Oral)   BP Readings from Last 3 Encounters:  06/27/20 (!) 114/56  06/13/20 110/64  06/06/20 115/79   Pulse Readings from Last 3 Encounters:  06/27/20 68  06/13/20 (!) 39  06/06/20 (!) 51    Wt Readings from Last 3 Encounters:  06/27/20 144 lb (65.3 kg)  06/13/20 144 lb (65.3 kg)  06/06/20 142 lb 8 oz (64.6 kg)     LABS  reviewed today    Lab Results  Component Value Date   WBC 7.9 06/13/2020   HGB 16.2 06/13/2020   HCT 47.0 06/13/2020   MCV 96 06/13/2020   PLT 222 06/13/2020   Lab Results  Component Value Date   CREATININE 2.37 (H) 06/13/2020   BUN 28 (H) 06/13/2020   NA 136 06/13/2020   K 4.3 06/13/2020   CL 92 (L) 06/13/2020   CO2 20 06/13/2020   Lab Results  Component Value Date   ALT 32 06/13/2020   AST 33 06/13/2020   ALKPHOS 84 06/13/2020   BILITOT 2.2 (  H) 06/13/2020   Lab Results  Component Value Date   CHOL 139 02/14/2020   HDL 42 02/14/2020   LDLCALC 85 02/14/2020   TRIG 60 02/14/2020   CHOLHDL 3.3 02/14/2020    Lab Results  Component Value Date   HGBA1C 5.7 (H) 02/14/2020   Lab Results  Component Value Date   TSH 4.450 06/13/2020     STUDIES/PROCEDURES reviewed today   Echo 05/29/20 1. Left ventricular ejection fraction, by estimation, is 50 to 55%. The  left ventricle has low normal function. Left ventricular endocardial  border not optimally defined to evaluate regional wall motion. There is  severe concentric left ventricular  hypertrophy. Left ventricular diastolic parameters are indeterminate.  Small cavity size.  2. Right ventricular systolic function is normal. The right ventricular  size is normal. Tricuspid regurgitation signal is inadequate for assessing  PA pressure.  3. Moderate pleural effusion in the left lateral region.  4. The mitral valve is normal in structure. Mild mitral valve  regurgitation. No evidence of mitral stenosis.  5. The aortic valve is normal in structure. Aortic valve regurgitation is  not visualized. No aortic stenosis is present.  6. The inferior vena cava is dilated in size with <50% respiratory  variability, suggesting right atrial pressure of 15 mmHg.   MPI 04/12/2020  This is an intermediate risk study due to moderately reduced EF  The left ventricular ejection fraction is moderately decreased (40%). Echo correlation  advised.  There is no evidence for ischemia  Rhythm was atrial fibrillation   Echo 02/14/20 1. Left ventricular ejection fraction, by estimation, is 55 to 60%. The  left ventricle has normal function. The left ventricle has no regional  wall motion abnormalities. There is severe eccentric left ventricular  hypertrophy of the apical and  basal-septal segments. Left ventricular diastolic parameters are  consistent with Grade I diastolic dysfunction (impaired relaxation).  2. Right ventricular systolic function is normal. The right ventricular  size is mildly enlarged.  3. Left atrial size was severely dilated.  4. Right atrial size was moderately dilated.  5. The mitral valve is normal in structure. Mild mitral valve  regurgitation. No evidence of mitral stenosis.  6. The aortic valve is normal in structure. Aortic valve regurgitation is  not visualized. No aortic stenosis is present.  7. The inferior vena cava is normal in size with greater than 50%  respiratory variability, suggesting right atrial pressure of 3 mmHg.  Conclusion(s)/Recommendation(s): Findings consistent with hypertrophic  obstructive cardiomyopathy.   Assessment & Plan    Chronic diastolic CHF Hypertrophic Cardiomyopathy / Echo findings consistent with HOCM --Diuresis complicated by recurrent Afib, CM /HCM per echo, dizziness, and recent Cr/K.  01/2020 echo EF nl, G1DD, severe eccentric LVH and noted as consistent with HOCM. Given echo, cMRI recommended as OP but not performed.  03/2020 MPI for CP and EKG changes performed and ruled intermediate risk due to reduced EF and atrial fibrillation but without evidence of ischemia.  Most recent echo as above during admission for decompensation with EF 50-55%, ongoing severe LVH, moderate pleural effusion, mild MR, RAP  Start Lasix 10mg  daily.  Recommend a BMET in 1-2 weeks to recheck renal function, electrolytes.   Discontinue torsemide 10mg  qod, given  report of uneven diuresis and more sx on days without his diuresis.  Torsemide dose was reduced between visits to every other day in the setting of bump in renal function.     No BB due to hypotension and  bradycardia at previous visit.  GDMT limited by renal function and vitals at this time.   Could consider cMRI as previously recommended above, as well as referral to advanced HF clinic in [place].  Persistent atrial fibrillation on OAC --Maintaining NSR s/p DCCV on amiodarone. No reported tachypalpitations. Complaint with reduced dose OAC. CHA2DS2-VASc score of at least 4 (CHF, hypertension, age x2). .  No rate control / BB due to bradycardia in sinus rhythm.    Reduce to amiodarone 200mg  daily.  Continue Eliquis 2.5mg  BID, as he qualifies for reduced dose Eliquis based on age and renal function.   HTN, BP goal 130/80 or lower --BP well controlled.   HLD --Continue Lipitor.  Medication changes: Stop torsemide. Start lasix 10mg  daily with potassium qod.   Labs ordered: BMET Future considerations: Cardiac MRI. Heart failure clinic in [place] Disposition: RTC 1-2 months  , PA-C 06/27/2020

## 2020-06-28 ENCOUNTER — Telehealth: Payer: Self-pay | Admitting: *Deleted

## 2020-06-28 NOTE — Telephone Encounter (Signed)
-----   Message from Lennon Alstrom, PA-C sent at 06/28/2020 12:02 PM EDT ----- Regarding: Confirm medication instructions / BMET Happy Wednesday!  (1) Are you able to call Mr. [place] and make sure his wife is clear he should take the potassium every OTHER day (versus the lasix is 10mg  daily). Fluid pill is every day. Kdur is every other day.   Hopefully, his swelling improves on the daily lasix.  (2) Can we get a repeat BMET in 1-2 weeks?   Thank you!

## 2020-06-28 NOTE — Telephone Encounter (Signed)
Spoke to pt's wife, Tamela Oddi ([place]), verified that pt and wife clear on medications instructions detailed below.  Betsy verbalized understanding that pt is to take Potassium every OTHER day and to take Lasix 10mg  every day.   said pt is scheduled to have repeat lab work including BMET at his PCP, Dr. Tamela Oddi at [facility] on Monday 4/25 which is just over 2 weeks. She said they will request results be sent to [phone]. Will notify provider and will follow up to make sure we receive labs after 4/25.

## 2020-07-13 ENCOUNTER — Telehealth: Payer: Self-pay | Admitting: Cardiovascular Disease

## 2020-07-13 DIAGNOSIS — I5033 Acute on chronic diastolic (congestive) heart failure: Secondary | ICD-10-CM

## 2020-07-13 DIAGNOSIS — I509 Heart failure, unspecified: Secondary | ICD-10-CM

## 2020-07-13 DIAGNOSIS — Z79899 Other long term (current) drug therapy: Secondary | ICD-10-CM

## 2020-07-13 DIAGNOSIS — E876 Hypokalemia: Secondary | ICD-10-CM

## 2020-07-13 NOTE — Telephone Encounter (Signed)
Spoke to pt's wife, Tamela Oddi. States pt has had incr mucus in chest/throat that started 3 days ago.  Incr SOB d/t mucus.  Pt continues Lasix 10mg  daily. Does have incr urination.  O2 sats 93-95% BP running 118/78 - 122/60 "HR 70 now and has been staying above 60" but wife does not have readings to report.  Wt has "stayed about the same"  Wt today 141.6 and yesterday 142.4 Pt scheduled to see Jacquelyn in office tomorrow 4/22 d/t symptoms. Advised wife to call office with any worsening s/s prior to visit.

## 2020-07-13 NOTE — Telephone Encounter (Signed)
Patient spouse calling  States patient keeps having bad nights Can't breathe so he can't sleep - has mucus in throat that he cannot get out  Patient scheduled to see Leafy Kindle tomorrow 04/22 at 1:30p

## 2020-07-14 ENCOUNTER — Ambulatory Visit: Payer: Medicare Other | Admitting: Physician Assistant

## 2020-07-16 NOTE — Telephone Encounter (Signed)
If they are able to get a repeat BMET, we can try to go up on diuresis if renal function allows.  Have they thought any further about the advanced heart failure clinic?

## 2020-07-17 ENCOUNTER — Encounter: Payer: Self-pay | Admitting: Cardiovascular Disease

## 2020-07-17 MED ORDER — POTASSIUM CHLORIDE CRYS ER 10 MEQ PO TBCR: 10.0000 meq | EXTENDED_RELEASE_TABLET | Freq: Every day | ORAL | 5 refills | Status: DC

## 2020-07-17 MED ORDER — FUROSEMIDE 20 MG PO TABS: 20.0000 mg | ORAL_TABLET | Freq: Every day | ORAL | Status: DC

## 2020-07-17 NOTE — Telephone Encounter (Signed)
Spoke to pt's wife, Tamela Oddi. Pt did have labs today at [facility] and results were faxed to our office.  Reviewed labs with [facility] in office d/t low Potassium at 3.4.  Per Luther Parody: Lab work shows K 3.4, creatinine 1.5, GFR 44. Kidney function improved compared to previous. Potassium mildly low. Recommend Potassium daily x3 days then Potassium daily. Given moderate pleural effusion by echo 05/29/20 in hospital, recommend transition to Lasix 20mg  daily. Repeat BMP in 1 week at [facility].   Betsy voiced understanding of instructions above. Refills sent for Potassium [facility] daily. Pt has on hand.  Lasix dose updated in chart.   did say she would agree to referral to advanced heart failure clinic.   Referral placed for [facility] and number given to [facility] to follow up.

## 2020-07-20 ENCOUNTER — Ambulatory Visit: Payer: Medicare Other | Admitting: Cardiovascular Disease

## 2020-07-24 ENCOUNTER — Other Ambulatory Visit
Admission: RE | Admit: 2020-07-24 | Discharge: 2020-07-24 | Disposition: A | Discharge status: Home / Self Care | Payer: Medicare Other | Source: Ambulatory Visit | Attending: Cardiovascular Disease | Admitting: Cardiovascular Disease

## 2020-07-24 ENCOUNTER — Telehealth: Payer: Self-pay | Admitting: *Deleted

## 2020-07-24 DIAGNOSIS — E876 Hypokalemia: Secondary | ICD-10-CM | POA: Diagnosis present

## 2020-07-24 DIAGNOSIS — I509 Heart failure, unspecified: Secondary | ICD-10-CM | POA: Diagnosis present

## 2020-07-24 DIAGNOSIS — Z79899 Other long term (current) drug therapy: Secondary | ICD-10-CM

## 2020-07-24 LAB — BASIC METABOLIC PANEL
Anion gap: 12 (ref 5–15)
BUN: 20 mg/dL (ref 8–23)
CO2: 27 mmol/L (ref 22–32)
Calcium: 9.5 mg/dL (ref 8.9–10.3)
Chloride: 99 mmol/L (ref 98–111)
Creatinine, Ser: 1.63 mg/dL — ABNORMAL HIGH (ref 0.61–1.24)
GFR, Estimated: 41 mL/min — ABNORMAL LOW (ref >60)
Glucose, Bld: 96 mg/dL (ref 70–99)
Potassium: 3.2 mmol/L — ABNORMAL LOW (ref 3.5–5.1)
Sodium: 138 mmol/L (ref 135–145)

## 2020-07-24 MED ORDER — POTASSIUM CHLORIDE CRYS ER 20 MEQ PO TBCR
EXTENDED_RELEASE_TABLET | ORAL | 0 refills | Status: DC
Start: 2020-07-24 — End: 2020-08-31

## 2020-07-24 MED ORDER — POTASSIUM CHLORIDE CRYS ER 20 MEQ PO TBCR: 20.0000 meq | EXTENDED_RELEASE_TABLET | Freq: Every day | ORAL | 5 refills | Status: DC

## 2020-07-24 NOTE — Telephone Encounter (Signed)
Spoke to [place], notified of recc below.  She is appreciative and voiced understanding.  Pt saw PCP yesterday, and she did not think to mention this so she is going to call PCP to make aware for any further recc.  In the meantime she will have pt start Muccinex 12 hour TWICE daily.

## 2020-07-24 NOTE — Telephone Encounter (Signed)
Spoke to pt's wife, Tamela Oddi, ok per DPR. Notified of lab results and provider's recc.  Betsy verbalized understanding.  Pt will continue Lasix 20mg  daily. Take Potassium daily x 3 days then continue Potassium daily thereafter. New Rx sent to pharmacy with refills.  Will repeat BMET in 1 week at the [facility].   [facility] asks if there is anything else that could help with pt's incr mucus in throat.  States in addition to him getting up every night to urinate d/t fluid pill, he also has to get out of bed and go to recliner because he has to continuously cough up mucus in throat. Notified Tamela Oddi I will ask Tamela Oddi of any further recc regarding incr mucus.  Otherwise, has no further questions.

## 2020-07-24 NOTE — Telephone Encounter (Signed)
Recommend Guafenesin (Muccinex). The 12-hour release can be given twice per day and is generally helpful with congestion. The short release has to be given every 4-6 hours which is more tedious. Also recommend to reach out to primary care provider.   Alver Sorrow, NP

## 2020-07-24 NOTE — Telephone Encounter (Signed)
-----   Message from Alver Sorrow, NP sent at 07/24/2020 11:27 AM EDT ----- Lab work shows stable kidney function. Potassium remains mildly low. Recommend potassium daily x 3 days then Potassium 20 mEq daily. Continue Lasix 20mg  daily. Repeat BMP in 1 week at [facility].

## 2020-07-31 ENCOUNTER — Other Ambulatory Visit
Admission: RE | Admit: 2020-07-31 | Discharge: 2020-07-31 | Disposition: A | Discharge status: Home / Self Care | Payer: Medicare Other | Attending: Family | Admitting: Family

## 2020-07-31 DIAGNOSIS — I509 Heart failure, unspecified: Secondary | ICD-10-CM

## 2020-07-31 DIAGNOSIS — E876 Hypokalemia: Secondary | ICD-10-CM | POA: Insufficient documentation

## 2020-07-31 DIAGNOSIS — Z79899 Other long term (current) drug therapy: Secondary | ICD-10-CM | POA: Diagnosis present

## 2020-07-31 LAB — BASIC METABOLIC PANEL
Anion gap: 10 (ref 5–15)
BUN: 16 mg/dL (ref 8–23)
CO2: 27 mmol/L (ref 22–32)
Calcium: 9.4 mg/dL (ref 8.9–10.3)
Chloride: 101 mmol/L (ref 98–111)
Creatinine, Ser: 1.6 mg/dL — ABNORMAL HIGH (ref 0.61–1.24)
GFR, Estimated: 41 mL/min — ABNORMAL LOW (ref >60)
Glucose, Bld: 137 mg/dL — ABNORMAL HIGH (ref 70–99)
Potassium: 4.5 mmol/L (ref 3.5–5.1)
Sodium: 138 mmol/L (ref 135–145)

## 2020-08-09 ENCOUNTER — Encounter: Payer: Self-pay | Admitting: Family

## 2020-08-09 ENCOUNTER — Ambulatory Visit: Payer: Medicare Other | Attending: Family | Admitting: Family

## 2020-08-09 ENCOUNTER — Encounter: Payer: Self-pay | Admitting: Pharmacist

## 2020-08-09 ENCOUNTER — Other Ambulatory Visit: Payer: Self-pay

## 2020-08-09 VITALS — BP 120/68 | HR 56 | Resp 18 | Ht 67.0 in | Wt 133.4 lb

## 2020-08-09 DIAGNOSIS — E785 Hyperlipidemia, unspecified: Secondary | ICD-10-CM | POA: Insufficient documentation

## 2020-08-09 DIAGNOSIS — Z87891 Personal history of nicotine dependence: Secondary | ICD-10-CM | POA: Diagnosis not present

## 2020-08-09 DIAGNOSIS — I509 Heart failure, unspecified: Secondary | ICD-10-CM | POA: Diagnosis present

## 2020-08-09 DIAGNOSIS — I5032 Chronic diastolic (congestive) heart failure: Secondary | ICD-10-CM

## 2020-08-09 DIAGNOSIS — F0390 Unspecified dementia without behavioral disturbance: Secondary | ICD-10-CM | POA: Insufficient documentation

## 2020-08-09 DIAGNOSIS — Z79890 Hormone replacement therapy (postmenopausal): Secondary | ICD-10-CM | POA: Diagnosis not present

## 2020-08-09 DIAGNOSIS — I10 Essential (primary) hypertension: Secondary | ICD-10-CM

## 2020-08-09 DIAGNOSIS — N183 Chronic kidney disease, stage 3 unspecified: Secondary | ICD-10-CM | POA: Diagnosis not present

## 2020-08-09 DIAGNOSIS — I130 Hypertensive heart and chronic kidney disease with heart failure and stage 1 through stage 4 chronic kidney disease, or unspecified chronic kidney disease: Secondary | ICD-10-CM | POA: Insufficient documentation

## 2020-08-09 DIAGNOSIS — I480 Paroxysmal atrial fibrillation: Secondary | ICD-10-CM | POA: Insufficient documentation

## 2020-08-09 DIAGNOSIS — E079 Disorder of thyroid, unspecified: Secondary | ICD-10-CM | POA: Diagnosis not present

## 2020-08-09 DIAGNOSIS — Z79899 Other long term (current) drug therapy: Secondary | ICD-10-CM | POA: Diagnosis not present

## 2020-08-09 DIAGNOSIS — Z7901 Long term (current) use of anticoagulants: Secondary | ICD-10-CM | POA: Insufficient documentation

## 2020-08-09 LAB — BASIC METABOLIC PANEL
Anion gap: 10 (ref 5–15)
BUN: 22 mg/dL (ref 8–23)
CO2: 27 mmol/L (ref 22–32)
Calcium: 9.2 mg/dL (ref 8.9–10.3)
Chloride: 100 mmol/L (ref 98–111)
Creatinine, Ser: 1.67 mg/dL — ABNORMAL HIGH (ref 0.61–1.24)
GFR, Estimated: 39 mL/min — ABNORMAL LOW (ref >60)
Glucose, Bld: 97 mg/dL (ref 70–99)
Potassium: 4.3 mmol/L (ref 3.5–5.1)
Sodium: 137 mmol/L (ref 135–145)

## 2020-08-09 MED ORDER — EMPAGLIFLOZIN 10 MG PO TABS: 10.0000 mg | ORAL_TABLET | Freq: Every day | ORAL | 0 refills | Status: DC

## 2020-08-09 NOTE — Patient Instructions (Addendum)
Continue weighing daily and call for an overnight weight gain of > 2 pounds or a weekly weight gain of >5 pounds.   Begin Jardiance as 1 tablet every day.    Decrease furosemide (lasix) to 1/2 tablet daily

## 2020-08-09 NOTE — Progress Notes (Signed)
Patient ID: NAVEN GIAMBALVO, male    DOB: 07-12-1931, 87 y.o.   MRN: [id number]  HPI  Mr Hays is a 87 y/o male with a history of paroxysmal atrial fibrillation, hyperlipidemia, HTN, CKD, thyroid disease, dementia, previous tobacco use and chronic heart failure.   Echo report from 05/29/20 reviewed and showed an EF of 50-55% along with severe LVH, mild MR and Moderate pleural effusion in the left lateral region. Echo report from 02/14/20 reviewed and showed an EF of 55-60% along with severe LVH, severe LAE and mild MR.   Admitted 05/27/20 due to shortness of breath due to HF and AF with RVR. Cardiology consult obtained. Initially given IV lasix with transition to oral diuretics. Cardioversion done with improvement of symptoms. PT evaluation done. Discharged after 5 days. Was in the ED 02/25/20 due to shortness of breath. CXR shows cardiomegaly. Offered IV lasix but patient prefers to go home with diuretic doubled. Admitted 02/13/20 due to acute on chronic HF. Cardiology consult obtained. Initially given IV lasix with transition to oral diuretics. Eliquis started due to new onset atrial fibrillation. Elevated troponin thought to be due to demand ischemia. Hypokalemia corrected. Discharged after 2 days.   Patient presents for a follow-up visit with a chief complaint of minimal shortness of breath upon moderate exertion. This has been chronic in nature having been present intermittently over several years. He has associated fatigue, light-headedness, weight loss and difficulty sleeping due to urination. He denies any abdominal distention, palpitations, pedal edema, chest pain, cough or fatigue.   We will be rechecking BMP today per cardiology request to make sure potassium and renal function continue to look stable.   Past Medical History:  Diagnosis Date  . (HFpEF) heart failure with preserved ejection fraction (HCC)   . CKD (chronic kidney disease), stage III (HCC)   . Dementia (HCC)   . History of  stress test    a. 03/2020 MV: No ischemia/infarct. EF 40% (55-60% by echo 01/2020).  Marland Kitchen Hyperlipidemia   . Hypertension   . Hypertrophic cardiomyopathy (HCC)    a. 01/2020 Echo: EF 55-60%, no rwma. Sev eccentric LVH of apical and basal-septal segments. gr1 DD. Mildly enlarged RV w/ nl RV fxn. sev dil LA, mod dil RA. Mild MR.  Marland Kitchen Hypothyroidism   . Persistent atrial fibrillation (HCC)    a. Dx 01/2020 s/p DCCV 02/2020; b. 02/2020 Recurrent Afib noted-->rate controlled-->amio started 05/2020 in setting of ongoing CHF; c. CHA2DS2VASc = 4-->Eliquis 2.5 BID.   Past Surgical History:  Procedure Laterality Date  . CARDIOVERSION N/A 03/14/2020   Procedure: CARDIOVERSION;  Surgeon: Iran Ouch, MD;  Location: [facility] ORS;  Service: Cardiovascular;  Laterality: N/A;  . CARDIOVERSION N/A 05/31/2020   Procedure: CARDIOVERSION;  Surgeon: Debbe Odea, MD;  Location: [facility] ORS;  Service: Cardiovascular;  Laterality: N/A;  . HERNIA REPAIR     Family History  Problem Relation Age of Onset  . Heart attack Father   . Heart disease Sister   . Heart disease Brother    Social History   Tobacco Use  . Smoking status: Former Games developer  . Smokeless tobacco: Never Used  Substance Use Topics  . Alcohol use: Not Currently   No Known Allergies  Prior to Admission medications   Medication Sig Start Date End Date Taking? Authorizing Provider  amiodarone (PACERONE) 200 MG tablet Take 1 tablet (200 mg total) by mouth daily. 06/27/20 07/27/20 Yes Visser, Jacquelyn D, PA-C  apixaban (ELIQUIS) 2.5 MG TABS tablet Take  1 tablet (2.5 mg total) by mouth 2 (two) times daily. 03/07/20 03/02/21 Yes Visser, Jacquelyn D, PA-C  atorvastatin (LIPITOR) 10 MG tablet Take 10 mg by mouth daily. 11/17/14  Yes [provider]  Cholecalciferol (DIALYVITE VITAMIN D 5000) 125 MCG (5000 UT) capsule Take 2,000 Units by mouth daily.   Yes [provider]  cyanocobalamin 1000 MCG tablet Take 1,000 mcg by mouth daily.   Yes  [provider]  furosemide (LASIX) 20 MG tablet Take 1 tablet (20 mg total) by mouth daily. 07/17/20 09/15/20 Yes Alver Sorrow, NP  levothyroxine (SYNTHROID) 75 MCG tablet Take 75 mcg by mouth daily before breakfast. 11/12/14  Yes [provider]  Naphazoline HCl (CLEAR EYES OP) Place 1 drop into both eyes daily as needed (dry/itchy eyes).   Yes [provider]  potassium chloride (KLOR-CON) 20 MEQ tablet Take 2 tablets ( ) daily x 3 days. Then take 1 tablet ( ) daily.  07/24/20 08/23/20  Alver Sorrow, NP    Review of Systems  Constitutional: Positive for appetite change (decreased). Negative for fatigue.  HENT: Positive for hearing loss. Negative for congestion, rhinorrhea and sore throat.   Eyes: Negative.   Respiratory: Positive for shortness of breath. Negative for cough, chest tightness and wheezing.   Cardiovascular: Negative for chest pain, palpitations and leg swelling.  Gastrointestinal: Negative for abdominal distention and abdominal pain.  Endocrine: Negative.   Genitourinary: Negative.   Musculoskeletal: Negative for back pain and neck pain.  Skin: Negative.   Allergic/Immunologic: Negative.   Neurological: Positive for light-headedness. Negative for dizziness and headaches.  Hematological: Negative for adenopathy. Does not bruise/bleed easily.  Psychiatric/Behavioral: Positive for sleep disturbance. Negative for dysphoric mood. The patient is not nervous/anxious.    Vitals:   08/09/20 1054  BP: 120/68  Pulse: (!) 56  Resp: 18  SpO2: 100%  Weight: 133 lb 6 oz (60.5 kg)  Height: 5\' 7"  (1.702 m)   Wt Readings from Last 3 Encounters:  08/09/20 133 lb 6 oz (60.5 kg)  06/27/20 144 lb (65.3 kg)  06/13/20 144 lb (65.3 kg)   Lab Results  Component Value Date   CREATININE 1.60 (H) 07/31/2020   CREATININE 1.63 (H) 07/24/2020   CREATININE 2.37 (H) 06/13/2020    Physical Exam Vitals and nursing note reviewed. Exam conducted with a  chaperone present (wife is present).  Constitutional:      Appearance: Normal appearance.  HENT:     Head: Normocephalic and atraumatic.     Right Ear: Decreased hearing noted.     Left Ear: Decreased hearing noted.  Cardiovascular:     Rate and Rhythm: Normal rate and regular rhythm.  Pulmonary:     Effort: Pulmonary effort is normal. No respiratory distress.     Breath sounds: No wheezing or rales.  Abdominal:     General: There is no distension.     Palpations: Abdomen is soft.     Tenderness: There is no abdominal tenderness.  Musculoskeletal:        General: No tenderness.     Cervical back: Normal range of motion and neck supple.     Right lower leg: No edema.     Left lower leg: No edema.  Skin:    General: Skin is warm and dry.  Neurological:     General: No focal deficit present.     Mental Status: He is alert. Mental status is at baseline.  Psychiatric:  Mood and Affect: Mood normal.        Behavior: Behavior normal.     Assessment & Plan:  1: Chronic heart failure with preserved ejection fraction with structural changes (LVH)- - NYHA class II - euvolemic today - weighing daily and wife says that his weight has continued to decline as he's not eating much nor drinking ensure/ boost (due to taste); reminded to call for an overnight weight gain of > 2 pounds or a weekly weight gain of >5 pounds - weight down 9 pounds from last visit here 2 months ago - not adding any additional salt to his food - saw cardiology Michaelle Birks) 06/27/20; returns 08/31/20 - current BP may not allow for addition of entresto - will add GDMT of jardiance 10mg  daily; 14 day voucher given to patient's wife along with 2 week's of samples; will reduce his current furosemide to 10mg  daily; can resume 20mg  daily if above weight gain occurs or worsening shortness of breath or edema  - jardiance patient assistance forms filled out - BNP 05/27/20 was 2487.9 - PharmD reconciled medications with the  patient and his wife  2: HTN- - BP looks good today - saw PCP ) 07/24/20 - BMP 07/31/20 reviewed and showed sodium 138, potassium 4.5, creatinine 1.60 and GFR 41 - has been hypokalemic so will recheck BMP today  3: Paroxysmal atrial fibrillation- - currently rate controlled - on apixaban and amiodarone - cardioversion done in the past - apixaban patient assistance forms filled out today   Patient did not bring his medications nor a list. Each medication was verbally reviewed with the patient and he was encouraged to bring the bottles to every visit to confirm accuracy of list.  Return in 2 months or sooner for any questions/problems before then.

## 2020-08-09 NOTE — Progress Notes (Signed)
[facility] - PHARMACIST COUNSELING NOTE  ADHERENCE ASSESSMENT  Adherence strategy: Wife puts together a pill box for him   Do you ever forget to take your medication? [] Yes (1) [x] No (0)  Do you ever skip doses due to side effects? [] Yes (1) [x] No (0)  Do you have trouble affording your medicines? [] Yes (1) [x] No (0)  Are you ever unable to pick up your medication due to transportation difficulties? [] Yes (1) [x] No (0)  Do you ever stop taking your medications because you don't believe they are helping? [] Yes (1) [x] No (0)   Recommendations given to patient about increasing adherence: Pt endorses adherence and no barriers to obtaining medications  Guideline-Directed Medical Therapy/Evidence Based Medicine  ACE/ARB/ARNI: None Beta Blocker: None Aldosterone Antagonist: None Diuretic: Furosemide   SUBJECTIVE HPI: 87 yo male with a history of paroxysmal atrial fibrilation, HLD, HTN, CKD, thyroid diseease, dementia, previous tobacco use and HFpEF. Pt does not have any complaints at this time. His wife state that he has had poor PO intake but encourages him to eat/drink more. Pt's wife also reports that pt's HR has sometimes been running low (~57) and states his normal his around 9. Pt's wife reports checking pt's weight daily and BP almost every day.   Past Medical History:  Diagnosis Date  . (HFpEF) heart failure with preserved ejection fraction ([place])   . CKD (chronic kidney disease), stage III ([place])   . Dementia ([place])   . History of stress test    a. 03/2020 MV: No ischemia/infarct. EF 40% (55-60% by echo 01/2020).  Marland Kitchen Hyperlipidemia   . Hypertension   . Hypertrophic cardiomyopathy ([place])    a. 01/2020 Echo: EF 55-60%, no rwma. Sev eccentric LVH of apical and basal-septal segments. gr1 DD. Mildly enlarged RV w/ nl RV fxn. sev dil LA, mod dil RA. Mild MR.  Marland Kitchen Hypothyroidism   . Persistent atrial fibrillation ([place])    a. Dx 01/2020 s/p  DCCV 02/2020; b. 02/2020 Recurrent Afib noted-->rate controlled-->amio started 05/2020 in setting of ongoing CHF; c. CHA2DS2VASc = 4-->Eliquis 2.5 BID.     OBJECTIVE Vital signs: HR 56, BP 120/68, weight (pounds) 133 ECHO: Date 05/29/20, EF 50-55%, notes: severe concentric LVH  BMP Latest Ref Rng & Units 07/31/2020 07/24/2020 06/13/2020  Glucose 70 - 99 mg/dL 137(H) 96 95  BUN 8 - 23 mg/dL 16 20 28(H)  Creatinine 0.61 - 1.24 mg/dL 1.60(H) 1.63(H) 2.37(H)  BUN/Creat Ratio 10 - 24 - - 12  Sodium 135 - 145 mmol/L 138 138 136  Potassium 3.5 - 5.1 mmol/L 4.5 3.2(L) 4.3  Chloride 98 - 111 mmol/L 101 99 92(L)  CO2 22 - 32 mmol/L 27 27 20   Calcium 8.9 - 10.3 mg/dL 9.4 9.5 10.0    ASSESSMENT/PLAN Chronic heart failure with preserved ejection fraction/HTN -BP WNL today -continue furosemide 20mg  -given pt severe LVH, recommended starting SGLT2i -pt wife reports poor PO intake; encouraged pt to drink fluids to avoid dehydration/overdiuresis  Paroxysmal Afib -continue amiodarone 200mg  daily -continue apixaban 2.5mg  BID -continue to monitor HR at home  HLD -continue atorvastatin 10mg  daily  Hypothyroidism -continue levothyroxine 70mcg daily  Time spent: 10 minutes  Sherilyn Banker, PharmD Pharmacy Resident  08/09/2020 11:04 AM    Current Outpatient Medications:  .  amiodarone (PACERONE) 200 MG tablet, Take 1 tablet (200 mg total) by mouth daily., Disp: 60 tablet, Rfl: 0 .  apixaban (ELIQUIS) 2.5 MG TABS tablet, Take 1 tablet (2.5 mg total)  by mouth 2 (two) times daily., Disp: 180 tablet, Rfl: 3 .  atorvastatin (LIPITOR) 10 MG tablet, Take 10 mg by mouth daily., Disp: , Rfl:  .  Cholecalciferol (DIALYVITE VITAMIN D 5000) 125 MCG (5000 UT) capsule, Take 2,000 Units by mouth daily., Disp: , Rfl:  .  cyanocobalamin 1000 MCG tablet, Take 1,000 mcg by mouth daily., Disp: , Rfl:  .  furosemide (LASIX) 20 MG tablet, Take 1 tablet (20 mg total) by mouth daily., Disp: , Rfl:  .  levothyroxine  (SYNTHROID) 75 MCG tablet, Take 75 mcg by mouth daily before breakfast., Disp: , Rfl:  .  Naphazoline HCl (CLEAR EYES OP), Place 1 drop into both eyes daily as needed (dry/itchy eyes)., Disp: , Rfl:  .  potassium chloride (KLOR-CON) 20 MEQ tablet, Take 2 tablets (77mEq) daily x 3 days. Then take 1 tablet (47mEq) daily. (Patient not taking: Reported on 08/09/2020), Disp: 33 tablet, Rfl: 0 .  [START ON 08/23/2020] potassium chloride SA (KLOR-CON M20) 20 MEQ tablet, Take 1 tablet (20 mEq total) by mouth daily., Disp: 30 tablet, Rfl: 5   COUNSELING POINTS/CLINICAL PEARLS Furosemide  Drug causes sun-sensitivity. Advise patient to use sunscreen and avoid tanning beds. Patient should avoid activities requiring coordination until drug effects are realized, as drug may cause dizziness, vertigo, or blurred vision. This drug may cause hyperglycemia, hyperuricemia, constipation, diarrhea, loss of appetite, nausea, vomiting, purpuric disorder, cramps, spasticity, asthenia, headache, paresthesia, or scaling eczema. Instruct patient to report unusual bleeding/bruising or signs/symptoms of hypotension, infection, pancreatitis, or ototoxicity (tinnitus, hearing impairment). Advise patient to report signs/symptoms of a severe skin reactions (flu-like symptoms, spreading red rash, or skin/mucous membrane blistering) or erythema multiforme. Instruct patient to eat high-potassium foods during drug therapy, as directed by healthcare professional.  Patient should not drink alcohol while taking this drug.  DRUGS TO AVOID IN HEART FAILURE  Drug or Class Mechanism  Analgesics . NSAIDs . COX-2 inhibitors . Glucocorticoids  Sodium and water retention, increased systemic vascular resistance, decreased response to diuretics   Diabetes Medications . Metformin . Thiazolidinediones o Rosiglitazone (Avandia) o Pioglitazone (Actos) . DPP4 Inhibitors o Saxagliptin (Onglyza) o Sitagliptin (Januvia)   Lactic  acidosis Possible calcium channel blockade   Unknown  Antiarrhythmics . Class I  o Flecainide o Disopyramide . Class III o Sotalol . Other o Dronedarone  Negative inotrope, proarrhythmic   Proarrhythmic, beta blockade  Negative inotrope  Antihypertensives . Alpha Blockers o Doxazosin . Calcium Channel Blockers o Diltiazem o Verapamil o Nifedipine . Central Alpha Adrenergics o Moxonidine . Peripheral Vasodilators o Minoxidil  Increases renin and aldosterone  Negative inotrope    Possible sympathetic withdrawal  Unknown  Anti-infective . Itraconazole . Amphotericin B  Negative inotrope Unknown  Hematologic . Anagrelide . Cilostazol   Possible inhibition of PD IV Inhibition of PD III causing arrhythmias  Neurologic/Psychiatric . Stimulants . Anti-Seizure Drugs o Carbamazepine o Pregabalin . Antidepressants o Tricyclics o Citalopram . Parkinsons o Bromocriptine o Pergolide o Pramipexole . Antipsychotics o Clozapine . Antimigraine o Ergotamine o Methysergide . Appetite suppressants . Bipolar o Lithium  Peripheral alpha and beta agonist activity  Negative inotrope and chronotrope Calcium channel blockade  Negative inotrope, proarrhythmic Dose-dependent QT prolongation  Excessive serotonin activity/valvular damage Excessive serotonin activity/valvular damage Unknown  IgE mediated hypersensitivy, calcium channel blockade  Excessive serotonin activity/valvular damage Excessive serotonin activity/valvular damage Valvular damage  Direct myofibrillar degeneration, adrenergic stimulation  Antimalarials . Chloroquine . Hydroxychloroquine Intracellular inhibition of lysosomal enzymes  Urologic Agents .  Alpha Blockers o Doxazosin o Prazosin o Tamsulosin o Terazosin  Increased renin and aldosterone  Adapted from Page RL, et al. "Drugs That May Cause or Exacerbate Heart Failure: A Scientific Statement from the [place]." Circulation 2016; 694:W54-O27. DOI: 10.1161/CIR.0000000000000426   MEDICATION ADHERENCES TIPS AND STRATEGIES 1. Taking medication as prescribed improves patient outcomes in heart failure (reduces hospitalizations, improves symptoms, increases survival) 2. Side effects of medications can be managed by decreasing doses, switching agents, stopping drugs, or adding additional therapy. Please let someone in the [facility] know if you have having bothersome side effects so we can modify your regimen. Do not alter your medication regimen without talking to [place].  3. Medication reminders can help patients remember to take drugs on time. If you are missing or forgetting doses you can try linking behaviors, using pill boxes, or an electronic reminder like an alarm on your phone or an app. Some people can also get automated phone calls as medication reminders.

## 2020-08-10 ENCOUNTER — Telehealth: Payer: Self-pay | Admitting: Family

## 2020-08-10 NOTE — Telephone Encounter (Signed)
Notified patient that he was denied patient assistance for Jardiance due to income.   Ademola Vert, NT

## 2020-08-11 ENCOUNTER — Telehealth: Payer: Self-pay | Admitting: Family

## 2020-08-11 MED ORDER — FUROSEMIDE 20 MG PO TABS: 10.0000 mg | ORAL_TABLET | Freq: Every day | ORAL | Status: DC

## 2020-08-11 NOTE — Telephone Encounter (Signed)
Spoke with patient's wife regarding jardiance. She says that patient became dizzy and weak after taking the jardiance and was concerned about whether he should continue it. Said that he slept in the bed all day which is not like him.   Advised her to stop the jardiance but to continue the 10mg  dose of furosemide. If he has weight gain > 2 pounds overnight, >5 pounds in a week, swelling or shortness of breath, she can resume the 20mg  dose.   Patient's wife verbalized understanding and was appreciative of the information. She says that she will stop the jardiance.

## 2020-08-31 ENCOUNTER — Ambulatory Visit (INDEPENDENT_AMBULATORY_CARE_PROVIDER_SITE_OTHER): Payer: Medicare Other | Admitting: Physician Assistant

## 2020-08-31 ENCOUNTER — Other Ambulatory Visit: Payer: Self-pay

## 2020-08-31 ENCOUNTER — Encounter: Payer: Self-pay | Admitting: Physician Assistant

## 2020-08-31 VITALS — BP 90/60 | HR 57 | Ht 68.0 in | Wt 138.4 lb

## 2020-08-31 DIAGNOSIS — I5032 Chronic diastolic (congestive) heart failure: Secondary | ICD-10-CM | POA: Diagnosis not present

## 2020-08-31 DIAGNOSIS — I422 Other hypertrophic cardiomyopathy: Secondary | ICD-10-CM

## 2020-08-31 DIAGNOSIS — Z9889 Other specified postprocedural states: Secondary | ICD-10-CM

## 2020-08-31 DIAGNOSIS — I10 Essential (primary) hypertension: Secondary | ICD-10-CM | POA: Diagnosis not present

## 2020-08-31 DIAGNOSIS — E785 Hyperlipidemia, unspecified: Secondary | ICD-10-CM | POA: Diagnosis not present

## 2020-08-31 DIAGNOSIS — Z7901 Long term (current) use of anticoagulants: Secondary | ICD-10-CM

## 2020-08-31 DIAGNOSIS — Z79899 Other long term (current) drug therapy: Secondary | ICD-10-CM

## 2020-08-31 DIAGNOSIS — Z5181 Encounter for therapeutic drug level monitoring: Secondary | ICD-10-CM

## 2020-08-31 DIAGNOSIS — I480 Paroxysmal atrial fibrillation: Secondary | ICD-10-CM

## 2020-08-31 DIAGNOSIS — H538 Other visual disturbances: Secondary | ICD-10-CM

## 2020-08-31 MED ORDER — AMIODARONE HCL 100 MG PO TABS: 100.0000 mg | ORAL_TABLET | Freq: Every day | ORAL | 3 refills | Status: DC

## 2020-08-31 MED ORDER — POTASSIUM CHLORIDE CRYS ER 20 MEQ PO TBCR
20.0000 meq | EXTENDED_RELEASE_TABLET | Freq: Every day | ORAL | 3 refills | Status: DC
Start: 2020-08-31 — End: 2021-07-07

## 2020-08-31 NOTE — Patient Instructions (Signed)
Medication Instructions:  Your physician has recommended you make the following change in your medication:  DECREASE Amiodarone to 100mg  daily  *If you need a refill on your cardiac medications before your next appointment, please call your pharmacy*   Lab Work:  None ordered  Testing/Procedures: None ordered   Follow-Up: At [facility], you and your health needs are our priority.  As part of our continuing mission to provide you with exceptional heart care, we have created designated Provider Care Teams.  These Care Teams include your primary Cardiologist (physician) and Advanced Practice Providers (APPs -  Physician Assistants and Nurse Practitioners) who all work together to provide you with the care you need, when you need it.  We recommend signing up for the patient portal called "MyChart".  Sign up information is provided on this After Visit Summary.  MyChart is used to connect with patients for Virtual Visits (Telemedicine).  Patients are able to view lab/test results, encounter notes, upcoming appointments, etc.  Non-urgent messages can be sent to your provider as well.   To learn more about what you can do with MyChart, go to [facility].    Your next appointment:   6 month(s)  The format for your next appointment:   In Person  Provider:   You may see [email or web address], MD or one of the following Advanced Practice Providers on your designated Care Team:   [name], NP Nicolasa Ducking, PA-C Eula Listen, PA-C Cadence [place], [place], NP   Other Instructions  We have sent a referral to [facility] for blurry vision

## 2020-08-31 NOTE — Progress Notes (Signed)
Office Visit    Patient Name: Robert Frye Date of Encounter: 08/31/2020  Primary Care Provider:  Gracelyn Nurse, MD Primary Cardiologist:  Robert Bears, MD  Chief Complaint    Chief Complaint  Patient presents with   2 month follow up     "Doing well." Medications reviewed by the patient verbally.      87 year old male with history of atrial fibrillation s/p DCCV on amiodarone, HFpEF/HCM, PHTN, hypertension, hyperlipidemia, CKD 3, hypothyroidism, mild dementia, and seen today for 2 month follow-up.  Past Medical History    Past Medical History:  Diagnosis Date   (HFpEF) heart failure with preserved ejection fraction (HCC)    CKD (chronic kidney disease), stage III (HCC)    Dementia (HCC)    History of stress test    a. 03/2020 MV: No ischemia/infarct. EF 40% (55-60% by echo 01/2020).   Hyperlipidemia    Hypertension    Hypertrophic cardiomyopathy (HCC)    a. 01/2020 Echo: EF 55-60%, no rwma. Sev eccentric LVH of apical and basal-septal segments. gr1 DD. Mildly enlarged RV w/ nl RV fxn. sev dil LA, mod dil RA. Mild MR.   Hypothyroidism    Persistent atrial fibrillation (HCC)    a. Dx 01/2020 s/p DCCV 02/2020; b. 02/2020 Recurrent Afib noted-->rate controlled-->amio started 05/2020 in setting of ongoing CHF; c. CHA2DS2VASc = 4-->Eliquis 2.5 BID.   Past Surgical History:  Procedure Laterality Date   CARDIOVERSION N/A 03/14/2020   Procedure: CARDIOVERSION;  Surgeon: Robert Ouch, MD;  Location: [facility] ORS;  Service: Cardiovascular;  Laterality: N/A;   CARDIOVERSION N/A 05/31/2020   Procedure: CARDIOVERSION;  Surgeon: Robert Odea, MD;  Location: [facility] ORS;  Service: Cardiovascular;  Laterality: N/A;   HERNIA REPAIR      Allergies  No Known Allergies  History of Present Illness    Robert Frye is a 87 y.o. male with PMH as above.    He was admitted to [facility] and noted to be in atrial fibrillation with RVR, volume overloaded, and HS Tn elevated thought  2/2 supply demand ischemia. 02/14/2020 echo showed EF 55 to 60%, NRWMA, severe eccentric left ventricular hypertrophy of the apical and basal septal segments, G1DD, mild RVE, severe LAE, moderate RAE, mild MR. Findings were noted to be consistent with hypertrophic obstructive cardiomyopathy on echo report.  He was IV diuresed with slight bump in renal function, improved after holding IV Lasix.  He was discharged on oral Lasix 20 mg daily, rate control, and reduced dose Eliquis. Possible outpatient cardiac MRI after OP cardioversion was recommended.   Since that time, he has been seen in the ED and admitted due to volume overload and with need for changes to outpatient medications to prevent volume overload.  Several adjustments to both oral outpatient diuresis and potassium supplementation have been required with frequent labs.  He has also been following with the heart failure clinic.  DCCV 12/21 performed with restoration of NSR.    Seen by Dr. Azucena Frye 03/20/20 and Lasix changed to torsemide 20mg  daily. BP soft.  Case discussed with primary cardiologist, Dr. with subsequent MPI 04/12/2020 that was ruled in intermediate risk study due to moderately reduced EF at 40% with consideration of atrial fibrillation rhythm.  Corresponding echo with normal EF and thus overall ruled low risk for ischemia. In January 2022, he struggled with volume status, low BP, DOE, and weakness.  Due to hypotension, torsemide and Lopressor doses were reduced.  With ongoing symptoms and volume  overload in the setting of tachycardia, he was loaded with amiodarone amiodarone and beta-blocker increased with plan for DCCV.    05/28/2019, he was admitted to the hospital after a decline over the preceding week.  He was overloaded with pleural effusion. Echo showed LVEF 50 to 55%, severe concentric LVH, indeterminate diastolic parameters, RV normal size and function, moderate pleural effusion in left lateral region, mild MR. He was  cardioverted 3/9 on oral amiodarone given risk of recurrent arrhythmia with dilated LA. He was transitioned back to torsemide 10mg  and lopressor 12.5mg  BID. SNF recommended and deferred. Thoracentesis deferred given improvement in respiratory status and size of effusion with diuresis.   Seen in follow-up by Robert Kindredina Hackney, NP at the [facility] advanced heart failure clinic 06/06/2020 and overall feeling improved since hospitalization.  Seen 3/22 and feeling poorly. It was noted he was bradycardic and hypotensive. He had been discharged on amiodarone 400mg  BID, rather than 200mg  BID.  Falls since discharge were reported.  He was uninterested in eating or drinking.  Amiodarone taper initiated.  Metoprolol discontinued.   Seen 06/27/2020 and noted continued though improved sx.  No further falls.  BP controlled.  Sedentary lifestyle reported.  Some concern was noted regarding lower extremity edema with left greater than that of right.  He was only using 1 pillow at night.  They were concerned regarding possible UTI with workup unremarkable. On 06/27/20, his torsemide was changed back to lasix. He was again referred to the HF clinic.   On 08/11/2020, a telephone encounter says that he discontinued Jardiance due to feeling dizzy on this medication.  Today, 08/31/2020, he presents for follow-up and overall is doing well from a cardiac standpoint.  He is joined again by his wife, reports that he has had increased appetite.  He continues to have low oral intake of fluids.  Despite low fluid intake, his wife notes that Robert Frye continues to urinate often, though not necessarily in large amounts.  We discussed the referral to the advanced heart failure clinic in [place] with report that they have called the clinic and are waiting for a call back regarding their candidacy.  He denies chest pain or shortness of breath at rest.  He has actually been able to become more active since her last visit and reports some ambulation  without any symptoms concerning for angina.  He does experience lightheadedness with position change, consistent with orthostasis.  They have not been monitoring his pressure at home with BP today 90/60 and HR 57 bpm.  He is maintaining sinus with sinus bradycardia on EKG.  Weight has remained stable and consistently between 136 to 137 pounds at home. No significant LEE. Still using 1 pillow. No orthopnea or PND. No early satiety. He has occasionally been coughing up phlegm with wife reporting that this has been going on ever since he was discharged from the hospital.  His phlegm is clear.  He reports recent blurry vision with recommendation to decrease amiodarone as below and referral to [place] [facility].  Blurry vision does not occur at the same time as his dizziness and comes and goes without clear triggers.  No signs or symptoms of bleeding.  Overall, energy improved. Medication compliance reported.   Home Medications    Outpatient Encounter Medications as of 08/31/2020  Medication Sig   apixaban (ELIQUIS) 2.5 MG TABS tablet Take 1 tablet (2.5 mg total) by mouth 2 (two) times daily.   atorvastatin (LIPITOR) 10 MG tablet Take 10 mg by  mouth daily.   Cholecalciferol (DIALYVITE VITAMIN D 5000) 125 MCG (5000 UT) capsule Take 2,000 Units by mouth daily.   cyanocobalamin 1000 MCG tablet Take 1,000 mcg by mouth daily.   furosemide (LASIX) 20 MG tablet Take 0.5 tablets (10 mg total) by mouth daily.   levothyroxine (SYNTHROID) 75 MCG tablet Take 75 mcg by mouth daily before breakfast.   Naphazoline HCl (CLEAR EYES OP) Place 1 drop into both eyes daily as needed (dry/itchy eyes).   [DISCONTINUED] amiodarone (PACERONE) 200 MG tablet Take 1 tablet (200 mg total) by mouth daily.   [DISCONTINUED] potassium chloride SA (KLOR-CON M20) 20 MEQ tablet Take 1 tablet (20 mEq total) by mouth daily.   amiodarone (PACERONE) 100 MG tablet Take 1 tablet (100 mg total) by mouth daily.   potassium chloride SA (KLOR-CON M20)  20 MEQ tablet Take 1 tablet (20 mEq total) by mouth daily.   [DISCONTINUED] potassium chloride (KLOR-CON) 20 MEQ tablet Take 2 tablets ( ) daily x 3 days. Then take 1 tablet ( ) daily. (Patient not taking: No sig reported)   No facility-administered encounter medications on file as of 08/31/2020.   Review of Systems    He denies chest pain, palpitations, shortness of breath, dyspnea, pnd, n, v, syncope, weight gain, or early satiety. He denies significant lower extremity edema, though noted on exam with L>R. He is still using 1 pillow at night.  He has orthostasis-like symptoms with dizziness between position change.he reports blurry vision without clear triggers and not necessarily occurring at the same time as his dizziness. Improved energy reported. All other systems reviewed and are otherwise negative except as noted above.  Physical Exam    VS:  BP 90/60 (BP Location: Left Arm, Patient Position: Sitting, Cuff Size: Normal)   Pulse (!) 57   Ht  (1.727 m)   Wt 138 lb 6 oz (62.8 kg)   SpO2 97%   BMI 21.04 kg/m  , BMI Body mass index is 21.04 kg/m. GEN: Elderly gentleman, in no acute distress.  Joined by his wife. HEENT: normal. Neck: Supple, no JVD, carotid bruits, or masses. Cardiac: Bradycardic but regular. 1/6 systolic murmur. No rubs, or gallops. No clubbing, cyanosis. 1-2+ bilateral edema up to mid tibia with L>R, radials/DP/PT 2+ and equal bilaterally.  Respiratory: CTAB.   Respirations regular and unlabored. GI: Soft, nontender, nondistended, BS + x 4. MS: no deformity or atrophy. Skin: warm and dry, no rash. Neuro:  Strength and sensation are intact. Psych: Normal affect.  Accessory Clinical Findings    ECG personally reviewed by me today -Sinus bradycardia with first-degree AV block, 68 bpm, PR interval 220 ms, left axis deviation with left ventricular hypertrophy and repolarization abnormalities, poor R wave progression inferior leads, TWI in lateral and V2-V4  leads as in prior tracings, Qtc 473  VITALS Reviewed today   Temp Readings from Last 3 Encounters:  06/01/20 97.7 F (36.5 C) (Oral)  03/14/20 97.8 F (36.6 C) (Oral)  02/25/20 97.6 F (36.4 C) (Oral)   BP Readings from Last 3 Encounters:  08/31/20 90/60  08/09/20 120/68  06/27/20 (!) 114/56   Pulse Readings from Last 3 Encounters:  08/31/20 (!) 57  08/09/20 (!) 56  06/27/20 68    Wt Readings from Last 3 Encounters:  08/31/20 138 lb 6 oz (62.8 kg)  08/09/20 133 lb 6 oz (60.5 kg)  06/27/20 144 lb (65.3 kg)     LABS  reviewed today   Lab Results  Component Value Date  WBC 7.9 06/13/2020   HGB 16.2 06/13/2020   HCT 47.0 06/13/2020   MCV 96 06/13/2020   PLT 222 06/13/2020   Lab Results  Component Value Date   CREATININE 1.67 (H) 08/09/2020   BUN 22 08/09/2020   NA 137 08/09/2020   K 4.3 08/09/2020   CL 100 08/09/2020   CO2 27 08/09/2020   Lab Results  Component Value Date   ALT 32 06/13/2020   AST 33 06/13/2020   ALKPHOS 84 06/13/2020   BILITOT 2.2 (H) 06/13/2020   Lab Results  Component Value Date   CHOL 139 02/14/2020   HDL 42 02/14/2020   LDLCALC 85 02/14/2020   TRIG 60 02/14/2020   CHOLHDL 3.3 02/14/2020    Lab Results  Component Value Date   HGBA1C 5.7 (H) 02/14/2020   Lab Results  Component Value Date   TSH 4.450 06/13/2020     STUDIES/PROCEDURES reviewed today   Echo 05/29/20  1. Left ventricular ejection fraction, by estimation, is 50 to 55%. The  left ventricle has low normal function. Left ventricular endocardial  border not optimally defined to evaluate regional wall motion. There is  severe concentric left ventricular  hypertrophy. Left ventricular diastolic parameters are indeterminate.  Small cavity size.   2. Right ventricular systolic function is normal. The right ventricular  size is normal. Tricuspid regurgitation signal is inadequate for assessing  PA pressure.   3. Moderate pleural effusion in the left lateral region.    4. The mitral valve is normal in structure. Mild mitral valve  regurgitation. No evidence of mitral stenosis.   5. The aortic valve is normal in structure. Aortic valve regurgitation is  not visualized. No aortic stenosis is present.   6. The inferior vena cava is dilated in size with <50% respiratory  variability, suggesting right atrial pressure of 15 mmHg.   MPI 04/12/2020 This is an intermediate risk study due to moderately reduced EF The left ventricular ejection fraction is moderately decreased (40%). Echo correlation advised. There is no evidence for ischemia Rhythm was atrial fibrillation   Echo 02/14/20  1. Left ventricular ejection fraction, by estimation, is 55 to 60%. The  left ventricle has normal function. The left ventricle has no regional  wall motion abnormalities. There is severe eccentric left ventricular  hypertrophy of the apical and  basal-septal segments. Left ventricular diastolic parameters are  consistent with Grade I diastolic dysfunction (impaired relaxation).   2. Right ventricular systolic function is normal. The right ventricular  size is mildly enlarged.   3. Left atrial size was severely dilated.   4. Right atrial size was moderately dilated.   5. The mitral valve is normal in structure. Mild mitral valve  regurgitation. No evidence of mitral stenosis.   6. The aortic valve is normal in structure. Aortic valve regurgitation is  not visualized. No aortic stenosis is present.   7. The inferior vena cava is normal in size with greater than 50%  respiratory variability, suggesting right atrial pressure of 3 mmHg.  Conclusion(s)/Recommendation(s): Findings consistent with hypertrophic  obstructive cardiomyopathy.   Assessment & Plan    Chronic diastolic CHF Hypertrophic Cardiomyopathy / Echo findings consistent with HOCM Pulmonary Hypertension --No SOB at rest, and while relatively sedentary, breathing status improving to allow for some  ambulation now.  01/2020 echo EF nl, G1DD, severe eccentric LVH and noted as consistent with HOCM. Given echo, cMRI recommended as OP but not yet performed.  03/2020 MPI for CP and EKG changes  performed and ruled intermediate risk due to reduced EF and atrial fibrillation but without evidence of ischemia.  Most recent echo as above during admission for decompensation with EF 50-55%, ongoing severe LVH, moderate pleural effusion, mild MR, RAP Continue Lasix 10mg  daily with KCl tab 20 M EQ daily.  No BB due to hypotension and bradycardia. GDMT limited by renal function and BP/HR.  Referred at previous clinic visit to advanced HF clinic in [place] scheduling of an appointment.  Cardiac MRI has been deferred as above. Escalation of therapy limited by HR/BP/Cr.  Persistent atrial fibrillation on OAC Amiodarone / anticoagulation medication monitoring --Maintaining NSR s/p DCCV 05/2020 on amiodarone 200mg  daily. No reported tachypalpitations. Complaint with reduced dose OAC. CHA2DS2-VASc score of at least 4 (CHF, hypertension, age x2). 06/2020 No BB/CCB due to bradycardia in sinus rhythm, as well as hypotension.   Reduce to amiodarone 100mg  daily.   EKG QTC 473 (6/9). TSH 4.450 (3/22) and 2.457 ([facility], [facility] 4/25). LFTs WNL (3/22). CXR with decreased left pleural effusion and basilar airspace disease (3/22). As below, referred to [facility] for blurry vision. Continue Eliquis 2.5mg  BID, as he qualifies for reduced dose Eliquis based on age >51 and Cr >1.5. H&H stable (3/22).   HTN, BP goal 130/80 or lower --Well controlled to hypotensive. No changes.  HLD --Continue Lipitor.  CKD3 Most recent BMET stable and K at goal. Caution with nephrotoxins.  Medication changes: Decrease to amiodarone 100mg  daily.   Labs ordered: None Future considerations: ? Scheduled with Heart failure clinic in [place]. cMRI deferred. Disposition: RTC 6 months, sooner if needed  [facility],  PA-C 08/31/2020

## 2020-09-01 ENCOUNTER — Telehealth: Payer: Self-pay | Admitting: *Deleted

## 2020-09-01 NOTE — Telephone Encounter (Signed)
Referral placed for [facility] for blurry vision per Marisue Ivan, PA.  Office note, demographics, med list and insurance card faxed @ [phone].

## 2020-09-24 NOTE — Progress Notes (Signed)
Patient ID: Robert Frye, male    DOB: 1932-01-18, 87 y.o.   MRN: [id number]  HPI  Robert Frye is a 87 y/o male with a history of paroxysmal atrial fibrillation, hyperlipidemia, HTN, CKD, thyroid disease, dementia, previous tobacco use and chronic heart failure.   Echo report from 05/29/20 reviewed and showed an EF of 50-55% along with severe LVH, mild Robert and Moderate pleural effusion in the left lateral region. Echo report from 02/14/20 reviewed and showed an EF of 55-60% along with severe LVH, severe LAE and mild Robert.   Admitted 05/27/20 due to shortness of breath due to HF and AF with RVR. Cardiology consult obtained. Initially given IV lasix with transition to oral diuretics. Cardioversion done with improvement of symptoms. PT evaluation done. Discharged after 5 days.   Patient presents for a follow-up visit with a chief complaint of   Past Medical History:  Diagnosis Date   (HFpEF) heart failure with preserved ejection fraction (HCC)    CKD (chronic kidney disease), stage III (HCC)    Dementia (HCC)    History of stress test    a. 03/2020 MV: No ischemia/infarct. EF 40% (55-60% by echo 01/2020).   Hyperlipidemia    Hypertension    Hypertrophic cardiomyopathy (HCC)    a. 01/2020 Echo: EF 55-60%, no rwma. Sev eccentric LVH of apical and basal-septal segments. gr1 DD. Mildly enlarged RV w/ nl RV fxn. sev dil LA, mod dil RA. Mild Robert.   Hypothyroidism    Persistent atrial fibrillation (HCC)    a. Dx 01/2020 s/p DCCV 02/2020; b. 02/2020 Recurrent Afib noted-->rate controlled-->amio started 05/2020 in setting of ongoing CHF; c. CHA2DS2VASc = 4-->Eliquis 2.5 BID.   Past Surgical History:  Procedure Laterality Date   CARDIOVERSION N/A 03/14/2020   Procedure: CARDIOVERSION;  Surgeon: Iran Ouch, MD;  Location: [facility] ORS;  Service: Cardiovascular;  Laterality: N/A;   CARDIOVERSION N/A 05/31/2020   Procedure: CARDIOVERSION;  Surgeon: Debbe Odea, MD;  Location: [facility] ORS;  Service:  Cardiovascular;  Laterality: N/A;   HERNIA REPAIR     Family History  Problem Relation Age of Onset   Heart attack Father    Heart disease Sister    Heart disease Brother    Social History   Tobacco Use   Smoking status: Former    Pack years: 0.00   Smokeless tobacco: Never  Substance Use Topics   Alcohol use: Not Currently   No Known Allergies  Prior to Admission medications   Medication Sig Start Date End Date Taking? Authorizing Provider  amiodarone (PACERONE) 100 MG tablet Take 1 tablet (100 mg total) by mouth daily. 08/31/20 08/26/21 Yes Visser, Jacquelyn D, PA-C  apixaban (ELIQUIS) 2.5 MG TABS tablet Take 1 tablet (2.5 mg total) by mouth 2 (two) times daily. 03/07/20 03/02/21 Yes Visser, Jacquelyn D, PA-C  atorvastatin (LIPITOR) 10 MG tablet Take 10 mg by mouth daily. 11/17/14  Yes [provider]  Cholecalciferol (DIALYVITE VITAMIN D 5000) 125 MCG (5000 UT) capsule Take 2,000 Units by mouth daily.   Yes [provider]  cyanocobalamin 1000 MCG tablet Take 1,000 mcg by mouth daily.   Yes [provider]  furosemide (LASIX) 20 MG tablet Take 0.5 tablets (10 mg total) by mouth daily. 08/11/20 10/10/20 Yes Delma Freeze, FNP  levothyroxine (SYNTHROID) 75 MCG tablet Take 75 mcg by mouth daily before breakfast. 11/12/14  Yes [provider]  Naphazoline HCl (CLEAR EYES OP) Place 1 drop into both eyes daily as  needed (dry/itchy eyes).   Yes [provider]  potassium chloride SA (KLOR-CON M20) 20 MEQ tablet Take 1 tablet (20 mEq total) by mouth daily. 08/31/20 08/26/21 Yes Marisue Ivan D, PA-C   Review of Systems  Constitutional:  Negative for appetite change and fatigue.  HENT:  Negative for congestion, postnasal drip and sore throat.   Eyes: Negative.   Respiratory:  Positive for shortness of breath. Negative for cough and chest tightness.   Cardiovascular:  Negative for chest pain, palpitations and leg swelling.  Gastrointestinal:   Negative for abdominal distention and abdominal pain.  Endocrine: Negative.   Genitourinary: Negative.   Musculoskeletal: Negative.   Skin: Negative.   Allergic/Immunologic: Negative.   Neurological:  Positive for light-headedness. Negative for dizziness.  Hematological:  Negative for adenopathy. Bruises/bleeds easily.  Psychiatric/Behavioral:  Positive for confusion. Negative for dysphoric mood and sleep disturbance. The patient is not nervous/anxious.    Vitals:   09/26/20 1123  BP: 123/70  Pulse: (!) 51  Resp: 16  SpO2: 100%  Weight: 138 lb 6 oz (62.8 kg)  Height: 5\' 8"  (1.727 m)   Wt Readings from Last 3 Encounters:  09/26/20 138 lb 6 oz (62.8 kg)  08/31/20 138 lb 6 oz (62.8 kg)  08/09/20 133 lb 6 oz (60.5 kg)   Lab Results  Component Value Date   CREATININE 1.67 (H) 08/09/2020   CREATININE 1.60 (H) 07/31/2020   CREATININE 1.63 (H) 07/24/2020    Physical Exam Vitals and nursing note reviewed. Exam conducted with a chaperone present (wife).  Constitutional:      Appearance: Normal appearance.  HENT:     Head: Normocephalic and atraumatic.  Cardiovascular:     Rate and Rhythm: Regular rhythm. Bradycardia present.  Pulmonary:     Effort: Pulmonary effort is normal. No respiratory distress.     Breath sounds: No wheezing or rales.  Abdominal:     General: There is no distension.     Palpations: Abdomen is soft.  Musculoskeletal:        General: No tenderness.     Cervical back: Normal range of motion and neck supple.     Right lower leg: No edema.     Left lower leg: No edema.  Skin:    General: Skin is warm and dry.  Neurological:     Mental Status: He is alert. Mental status is at baseline.  Psychiatric:        Mood and Affect: Mood normal.        Behavior: Behavior normal.     Assessment & Plan:  1: Chronic heart failure with preserved ejection fraction with structural changes (LVH)- - NYHA class II - euvolemic today - weighing daily and wife says  that his weight has stabilized; reminded to call for an overnight weight gain of > 2 pounds or a weekly weight gain of >5 pounds - weight up 5 pounds from last visit here 2 months ago - not adding any additional salt to his food - wife says that patient doesn't drink hardly any water; encouraged him to start by drinking 1 water bottle daily along with his coffee/ tea - saw cardiology 09/23/2020) 08/31/20 - waiting on appt at the advanced HF Clinic in [place] - has been hypotensive in the past so may not be a good candidate for entresto - patient developed dizziness/ weakness with jardiance with subsequent stoppage with improvement of weakness - BNP 05/27/20 was 2487.9  2: HTN- - BP looks good today -  saw PCP Letitia Libra) 07/24/20 - BMP 08/09/20 reviewed and showed sodium 137, potassium 4.3, creatinine 1.67 and GFR 39  3: Paroxysmal atrial fibrillation- - currently rate controlled - on apixaban and amiodarone - cardioversion done in the past   Medication bottles reviewed.   Due to HF stability and plan to send to advanced HF clinic, will not make a return appointment for patient at this time. Advised them that they could call back at anytime for questions or to make another appointment and they were comfortable with this plan.

## 2020-09-26 ENCOUNTER — Ambulatory Visit: Payer: Medicare Other | Attending: Family | Admitting: Family

## 2020-09-26 ENCOUNTER — Other Ambulatory Visit: Payer: Self-pay

## 2020-09-26 ENCOUNTER — Encounter: Payer: Self-pay | Admitting: Family

## 2020-09-26 VITALS — BP 123/70 | HR 51 | Resp 16 | Ht 68.0 in | Wt 138.4 lb

## 2020-09-26 DIAGNOSIS — I130 Hypertensive heart and chronic kidney disease with heart failure and stage 1 through stage 4 chronic kidney disease, or unspecified chronic kidney disease: Secondary | ICD-10-CM | POA: Insufficient documentation

## 2020-09-26 DIAGNOSIS — Z8249 Family history of ischemic heart disease and other diseases of the circulatory system: Secondary | ICD-10-CM | POA: Diagnosis not present

## 2020-09-26 DIAGNOSIS — N183 Chronic kidney disease, stage 3 unspecified: Secondary | ICD-10-CM | POA: Diagnosis not present

## 2020-09-26 DIAGNOSIS — R42 Dizziness and giddiness: Secondary | ICD-10-CM | POA: Diagnosis not present

## 2020-09-26 DIAGNOSIS — R531 Weakness: Secondary | ICD-10-CM | POA: Diagnosis not present

## 2020-09-26 DIAGNOSIS — I5032 Chronic diastolic (congestive) heart failure: Secondary | ICD-10-CM | POA: Diagnosis not present

## 2020-09-26 DIAGNOSIS — I480 Paroxysmal atrial fibrillation: Secondary | ICD-10-CM | POA: Insufficient documentation

## 2020-09-26 DIAGNOSIS — I10 Essential (primary) hypertension: Secondary | ICD-10-CM

## 2020-09-26 NOTE — Patient Instructions (Addendum)
Continue weighing daily and call for an overnight weight gain of > 2 pounds or a weekly weight gain of >5 pounds.    Call us in the future if you need [place] for anything or to make another appointment

## 2020-10-25 ENCOUNTER — Telehealth: Payer: Self-pay | Admitting: Cardiovascular Disease

## 2020-10-25 NOTE — Telephone Encounter (Signed)
STAT if patient feels like he/she is going to faint   Are you dizzy now? Wife calling unknown   Do you feel faint or have you passed out? No   Do you have any other symptoms? Worsening since last week fatigued will not drink as he should but making  frequent trips to void c/o dizziness with position changes   Have you checked your HR and BP (record if available)? no

## 2020-10-25 NOTE — Telephone Encounter (Signed)
I spoke with the patient's wife (ok per DPR). She advised the patient has been waking up in the middle of the night and complaining of feeling dizzy. She advised last night he sat on the side of the bed and was "talking out of his head a little bit" asking "where am I." She states he was unsteady on his feet when standing.  She does not check BP's on him during the night when he feels this way.  Weight is holding steady at 136-137 lbs. HR are "low" per Mrs. Schomer. He did see his PCP yesterday and his BP was 108/62 & HR 53 O2 sats were 99% on RA.  I have confirmed the patient is still taking amiodarone 100 mg once daily in the AM, lasix 10 mg once daily, & elquis 2.5 mg BID.  I asked Mrs. Sayegh if she mentioned these symptoms to the patient's PCP yesterday and she advised she did not because she was afraid he would change the patient's medications and she preferred this come from our office.   I inquired if the patient is staying hydrated and she advised his oral intake is "terrible." He will not drink much at all.  I have advised Mrs. Muhammed that we will make no changes at this time, but will see the patient as scheduled in the morning with Dr. Kirke Corin.  She voices understanding and was appreciative for the call back.

## 2020-10-25 NOTE — Telephone Encounter (Signed)
Scheduled tomorrow Robert Frye

## 2020-10-26 ENCOUNTER — Ambulatory Visit (INDEPENDENT_AMBULATORY_CARE_PROVIDER_SITE_OTHER): Payer: Medicare Other | Admitting: Cardiovascular Disease

## 2020-10-26 ENCOUNTER — Encounter: Payer: Self-pay | Admitting: Cardiovascular Disease

## 2020-10-26 ENCOUNTER — Other Ambulatory Visit: Payer: Self-pay

## 2020-10-26 VITALS — BP 112/60 | HR 57 | Ht 66.0 in | Wt 138.2 lb

## 2020-10-26 DIAGNOSIS — I10 Essential (primary) hypertension: Secondary | ICD-10-CM

## 2020-10-26 DIAGNOSIS — I5032 Chronic diastolic (congestive) heart failure: Secondary | ICD-10-CM

## 2020-10-26 DIAGNOSIS — I4819 Other persistent atrial fibrillation: Secondary | ICD-10-CM | POA: Diagnosis not present

## 2020-10-26 DIAGNOSIS — E785 Hyperlipidemia, unspecified: Secondary | ICD-10-CM | POA: Diagnosis not present

## 2020-10-26 NOTE — Patient Instructions (Signed)
Medication Instructions:  - Your physician recommends that you continue on your current medications as directed. Please refer to the Current Medication list given to you today.  *If you need a refill on your cardiac medications before your next appointment, please call your pharmacy*   Lab Work: - none ordered  If you have labs (blood work) drawn today and your tests are completely normal, you will receive your results only by: MyChart Message (if you have MyChart) OR A paper copy in the mail If you have any lab test that is abnormal or we need to change your treatment, we will call you to review the results.   Testing/Procedures: - none ordered   Follow-Up: At [facility], you and your health needs are our priority.  As part of our continuing mission to provide you with exceptional heart care, we have created designated Provider Care Teams.  These Care Teams include your primary Cardiologist (physician) and Advanced Practice Providers (APPs -  Physician Assistants and Nurse Practitioners) who all work together to provide you with the care you need, when you need it.  We recommend signing up for the patient portal called "MyChart".  Sign up information is provided on this After Visit Summary.  MyChart is used to connect with patients for Virtual Visits (Telemedicine).  Patients are able to view lab/test results, encounter notes, upcoming appointments, etc.  Non-urgent messages can be sent to your provider as well.   To learn more about what you can do with MyChart, go to [email or web address].    Your next appointment:   3 month(s)  The format for your next appointment:   In Person  Provider:   You may see [name], MD or one of the following Advanced Practice Providers on your designated Care Team:   Nicolasa Ducking, NP Eula Listen, PA-C Marisue Ivan, PA-C Cadence Fransico Michael, [place]   Other Instructions N/a

## 2020-10-26 NOTE — Progress Notes (Signed)
Cardiology Office Note   Date:  10/26/2020   ID:  Robert Frye, DOB 05/12/1931, MRN [id number]  PCP:  Gracelyn Nurse, MD  Cardiologist:   [name], MD   Chief Complaint  Patient presents with   Other    C/o low HR, Fatigue, weakness and Dizziness. Meds reviewed verbally with pt.      History of Present Illness: Robert Frye is a 87 y.o. male who presents for follow-up visit regarding persistent atrial fibrillation on chronic diastolic heart failure with pulmonary hypertension.  He has known history of essential hypertension, hyperlipidemia, chronic kidney disease, hypothyroidism and mild dementia.  He was hospitalized in November of last year with atrial fibrillation with RVR complicated by heart failure.  Troponin was mildly elevated thought to be due to demand ischemia.  Echocardiogram showed normal LV systolic function, severe eccentric left ventricular hypertrophy, severe left atrial enlargement, mild mitral regurgitation.  The patient improved with diuresis and rate control.  He was started on anticoagulation with Eliquis.  He underwent successful cardioversion in December.  He underwent Lexiscan Myoview in January of this year which showed no evidence of ischemia.  EF was mildly reduced but likely was not accurate.  He had recurrent atrial fibrillation and was started on amiodarone. He was hospitalized again and March with heart failure.  Echo showed an EF of 50 to 55% with severe concentric LVH, moderate size left pleural effusion and mild mitral regurgitation.  He underwent another cardioversion. He had subsequent bradycardia that necessitated discontinuation of metoprolol. He has been doing reasonably well but his wife brought him and due to concerns about orthostatic dizziness.  The patient denies any chest pain or shortness of breath.  He is a poor historian and his dementia seems to be getting worse.  No palpitations.  They report poor fluid intake overall.  He is  only on small dose furosemide 10 mg daily.    Past Medical History:  Diagnosis Date   (HFpEF) heart failure with preserved ejection fraction (HCC)    CKD (chronic kidney disease), stage III (HCC)    Dementia (HCC)    History of stress test    a. 03/2020 MV: No ischemia/infarct. EF 40% (55-60% by echo 01/2020).   Hyperlipidemia    Hypertension    Hypertrophic cardiomyopathy (HCC)    a. 01/2020 Echo: EF 55-60%, no rwma. Sev eccentric LVH of apical and basal-septal segments. gr1 DD. Mildly enlarged RV w/ nl RV fxn. sev dil LA, mod dil RA. Mild MR.   Hypothyroidism    Persistent atrial fibrillation (HCC)    a. Dx 01/2020 s/p DCCV 02/2020; b. 02/2020 Recurrent Afib noted-->rate controlled-->amio started 05/2020 in setting of ongoing CHF; c. CHA2DS2VASc = 4-->Eliquis 2.5 BID.    Past Surgical History:  Procedure Laterality Date   CARDIOVERSION N/A 03/14/2020   Procedure: CARDIOVERSION;  Surgeon: Iran Ouch, MD;  Location: [facility] ORS;  Service: Cardiovascular;  Laterality: N/A;   CARDIOVERSION N/A 05/31/2020   Procedure: CARDIOVERSION;  Surgeon: Debbe Odea, MD;  Location: [facility] ORS;  Service: Cardiovascular;  Laterality: N/A;   HERNIA REPAIR       Current Outpatient Medications  Medication Sig Dispense Refill   amiodarone (PACERONE) 100 MG tablet Take 1 tablet (100 mg total) by mouth daily. 90 tablet 3   apixaban (ELIQUIS) 2.5 MG TABS tablet Take 1 tablet (2.5 mg total) by mouth 2 (two) times daily. 180 tablet 3   atorvastatin (LIPITOR) 10 MG tablet Take 10  mg by mouth daily.     Cholecalciferol (DIALYVITE VITAMIN D 5000) 125 MCG (5000 UT) capsule Take 2,000 Units by mouth daily.     cyanocobalamin 1000 MCG tablet Take 1,000 mcg by mouth daily.     furosemide (LASIX) 20 MG tablet Take 0.5 tablets (10 mg total) by mouth daily. 30 tablet    levothyroxine (SYNTHROID) 75 MCG tablet Take 75 mcg by mouth daily before breakfast.     Naphazoline HCl (CLEAR EYES OP) Place 1 drop into  both eyes daily as needed (dry/itchy eyes).     potassium chloride SA (KLOR-CON M20) 20 MEQ tablet Take 1 tablet (20 mEq total) by mouth daily. 90 tablet 3   No current facility-administered medications for this visit.    Allergies:   Jardiance [empagliflozin]    Social History:  The patient  reports that he has quit smoking. He has never used smokeless tobacco. He reports previous alcohol use. He reports that he does not use drugs.   Family History:  The patient's family history includes Heart attack in his father; Heart disease in his brother and sister.    ROS:  Please see the history of present illness.   Otherwise, review of systems are positive for none.   All other systems are reviewed and negative.    PHYSICAL EXAM: VS:  BP 112/60 (BP Location: Left Arm, Patient Position: Sitting, Cuff Size: Normal)   Pulse (!) 57   Ht 5\' 6"  (1.676 m)   Wt 138 lb 4 oz (62.7 kg)   SpO2 94%   BMI 22.31 kg/m  , BMI Body mass index is 22.31 kg/m. GEN: Well nourished, well developed, in no acute distress  HEENT: normal  Neck: no JVD, carotid bruits, or masses Cardiac: RRR; no murmurs, rubs, or gallops,no edema  Respiratory:  clear to auscultation bilaterally, normal work of breathing GI: soft, nontender, nondistended, + BS MS: no deformity or atrophy  Skin: warm and dry, no rash Neuro:  Strength and sensation are intact Psych: euthymic mood, full affect   EKG:  EKG is ordered today. The ekg ordered today demonstrates sinus bradycardia with first-degree AV block, left axis deviation, LVH with repolarization abnormalities and possible old inferior infarct   Recent Labs: 05/27/2020: B Natriuretic Peptide 2,489.7 06/01/2020: Magnesium 2.3 06/13/2020: ALT 32; Hemoglobin 16.2; Platelets 222; TSH 4.450 08/09/2020: BUN 22; Creatinine, Ser 1.67; Potassium 4.3; Sodium 137    Lipid Panel    Component Value Date/Time   CHOL 139 02/14/2020 0410   TRIG 60 02/14/2020 0410   HDL 42 02/14/2020 0410    CHOLHDL 3.3 02/14/2020 0410   VLDL 12 02/14/2020 0410   LDLCALC 85 02/14/2020 0410      Wt Readings from Last 3 Encounters:  10/26/20 138 lb 4 oz (62.7 kg)  09/26/20 138 lb 6 oz (62.8 kg)  08/31/20 138 lb 6 oz (62.8 kg)       No flowsheet data found.    ASSESSMENT AND PLAN:  1.  Chronic diastolic heart failure with pulmonary hypertension: He appears to be euvolemic on small dose furosemide.  I reviewed his recent labs from last week and they showed stable creatinine at 1.7 and normal BUN.  No evidence of significant volume depletion.  Continue small dose furosemide.  2.  Persistent atrial fibrillation: He is maintaining in sinus rhythm with small dose amiodarone.  He is mildly bradycardic but overall stable.  I discussed with the wife that if we stop amiodarone, there is high risk  for recurrent atrial fibrillation.  His atrial fibrillation was difficult to control and was complicated by heart failure and thus I recommend continuing long-term amiodarone use.  He is tolerating small dose Eliquis 2.5 mg twice daily and I reviewed his recent labs which showed normal CBC.  3.  Hyperlipidemia: On atorvastatin 10 mg daily.  4.  Chronic kidney disease: Stable with the most recent creatinine of 1.7.  5.  Orthostatic hypotension: He is mildly orthostatic today.  Suspect multifactorial given underlying bradycardia and diuretic use.  Continue to monitor closely for now.  Advised him to avoid quick standing up.   Disposition:   FU with me in 3 months  Signed,  [name], MD  10/26/2020 9:24 AM    [facility]

## 2020-11-13 ENCOUNTER — Telehealth: Payer: Self-pay | Admitting: Cardiovascular Disease

## 2020-11-13 NOTE — Telephone Encounter (Signed)
STAT if patient feels like he/she is going to faint   Are you dizzy now? Yes, he is dizzy all the time  Do you feel faint or have you passed out? no  Do you have any other symptoms? Not sleeping, up and down all night   Have you checked your HR and BP (record if available)? HR is in the 50s, oxygen in 90s - does not have current BP reading   Patient recently saw Dr Kirke Corin on 08/03

## 2020-11-13 NOTE — Telephone Encounter (Signed)
Spoke with the patients wife.  Patients wife sts that she thinks the patient is dehydrated. He has poor fluid intake. She does not check his bp regularly His HR this morning 59 bpm.  Patients wife reports that the patient has not been sleeping and will stay awake at night and talk to himself.This morning he thought his son was living with them and he has not lived with them for over 40 years. Patient does have dementia and is poor historian. Pt wife reports that the pt weights have been stable. Denies increased sob, swelling.  Adv the patients wife to contact the patients pcp asap to discuss the patients symptoms and changes in his cognitive behavior.  Patients wife verbalized understanding and voiced appreciation for the call back.

## 2020-11-14 ENCOUNTER — Encounter: Payer: Self-pay | Admitting: Anesthesiology

## 2020-11-14 ENCOUNTER — Encounter: Payer: Self-pay | Admitting: Ophthalmology

## 2020-11-15 ENCOUNTER — Other Ambulatory Visit: Payer: Self-pay | Admitting: Family Medicine

## 2020-11-15 DIAGNOSIS — J90 Pleural effusion, not elsewhere classified: Secondary | ICD-10-CM

## 2020-11-16 ENCOUNTER — Ambulatory Visit
Admission: RE | Admit: 2020-11-16 | Discharge: 2020-11-16 | Disposition: A | Discharge status: Home / Self Care | Payer: Medicare Other | Source: Ambulatory Visit | Attending: Family Medicine | Admitting: Family Medicine

## 2020-11-16 ENCOUNTER — Other Ambulatory Visit: Payer: Self-pay

## 2020-11-16 ENCOUNTER — Other Ambulatory Visit: Payer: Self-pay | Admitting: Family Medicine

## 2020-11-16 ENCOUNTER — Other Ambulatory Visit: Payer: Self-pay | Admitting: Radiology

## 2020-11-16 DIAGNOSIS — R911 Solitary pulmonary nodule: Secondary | ICD-10-CM

## 2020-11-16 DIAGNOSIS — J90 Pleural effusion, not elsewhere classified: Secondary | ICD-10-CM

## 2020-11-16 DIAGNOSIS — I4891 Unspecified atrial fibrillation: Secondary | ICD-10-CM | POA: Diagnosis not present

## 2020-11-16 DIAGNOSIS — I509 Heart failure, unspecified: Secondary | ICD-10-CM | POA: Insufficient documentation

## 2020-11-16 DIAGNOSIS — Z9889 Other specified postprocedural states: Secondary | ICD-10-CM

## 2020-11-16 LAB — GLUCOSE, PLEURAL OR PERITONEAL FLUID: Glucose, Fluid: 112 mg/dL

## 2020-11-16 LAB — PROTEIN, PLEURAL OR PERITONEAL FLUID: Total protein, fluid: <3 g/dL

## 2020-11-16 LAB — BODY FLUID CELL COUNT WITH DIFFERENTIAL
Eos, Fluid: 0 %
Lymphs, Fluid: 63 %
Monocyte-Macrophage-Serous Fluid: 12 %
Neutrophil Count, Fluid: 25 %
Total Nucleated Cell Count, Fluid: 221 cu mm

## 2020-11-16 LAB — AMYLASE, PLEURAL OR PERITONEAL FLUID: Amylase, Fluid: 59 U/L

## 2020-11-16 LAB — LACTATE DEHYDROGENASE, PLEURAL OR PERITONEAL FLUID: LD, Fluid: 54 U/L — ABNORMAL HIGH (ref 3–23)

## 2020-11-16 NOTE — Procedures (Signed)
PROCEDURE SUMMARY:  Successful US guided left thoracentesis. Yielded 1200 mL of clear yellow fluid. Pt tolerated procedure well. No immediate complications.  Specimen was sent for labs. Patient scheduled for CT Chest following the procedure per ordering provider.  EBL < 5 mL  Pattricia Boss D PA-C 11/16/2020 2:26 PM

## 2020-11-19 LAB — CHOLESTEROL, BODY FLUID: Cholesterol, Fluid: 20 mg/dL

## 2020-11-19 LAB — PROTEIN, BODY FLUID (OTHER)
Source of Sample: <3
Total Protein, Body Fluid Other: 2.3 g/dL

## 2020-11-20 LAB — BODY FLUID CULTURE W GRAM STAIN: Culture: NO GROWTH

## 2020-11-20 LAB — CYTOLOGY - NON PAP

## 2020-11-21 ENCOUNTER — Ambulatory Visit: Admit: 2020-11-21 | Payer: Medicare Other | Admitting: Ophthalmology

## 2020-11-21 SURGERY — PHACOEMULSIFICATION, CATARACT, WITH IOL INSERTION
Anesthesia: Topical | Laterality: Right

## 2020-11-22 LAB — PH, BODY FLUID: pH, Body Fluid: 7.5

## 2020-11-29 LAB — ACID FAST SMEAR (AFB, MYCOBACTERIA): Acid Fast Smear: NEGATIVE

## 2020-12-05 ENCOUNTER — Ambulatory Visit: Admit: 2020-12-05 | Payer: Medicare Other | Admitting: Ophthalmology

## 2020-12-05 SURGERY — PHACOEMULSIFICATION, CATARACT, WITH IOL INSERTION
Anesthesia: Topical | Laterality: Left

## 2020-12-29 LAB — FUNGAL ORGANISM REFLEX

## 2020-12-29 LAB — FUNGUS CULTURE WITH STAIN

## 2020-12-29 LAB — FUNGUS CULTURE RESULT

## 2021-01-10 LAB — ACID FAST CULTURE WITH REFLEXED SENSITIVITIES (MYCOBACTERIA): Acid Fast Culture: NEGATIVE

## 2021-01-26 ENCOUNTER — Ambulatory Visit (INDEPENDENT_AMBULATORY_CARE_PROVIDER_SITE_OTHER): Payer: Medicare Other | Admitting: Cardiovascular Disease

## 2021-01-26 ENCOUNTER — Encounter: Payer: Self-pay | Admitting: Cardiovascular Disease

## 2021-01-26 ENCOUNTER — Other Ambulatory Visit: Payer: Self-pay

## 2021-01-26 VITALS — BP 108/60 | HR 56 | Ht 66.0 in | Wt 135.2 lb

## 2021-01-26 DIAGNOSIS — I10 Essential (primary) hypertension: Secondary | ICD-10-CM

## 2021-01-26 DIAGNOSIS — I4819 Other persistent atrial fibrillation: Secondary | ICD-10-CM

## 2021-01-26 DIAGNOSIS — E785 Hyperlipidemia, unspecified: Secondary | ICD-10-CM | POA: Diagnosis not present

## 2021-01-26 DIAGNOSIS — I5032 Chronic diastolic (congestive) heart failure: Secondary | ICD-10-CM | POA: Diagnosis not present

## 2021-01-26 NOTE — Patient Instructions (Signed)
Medication Instructions:  - Your physician recommends that you continue on your current medications as directed. Please refer to the Current Medication list given to you today.  *If you need a refill on your cardiac medications before your next appointment, please call your pharmacy*   Lab Work: - none ordered  If you have labs (blood work) drawn today and your tests are completely normal, you will receive your results only by: MyChart Message (if you have MyChart) OR A paper copy in the mail If you have any lab test that is abnormal or we need to change your treatment, we will call you to review the results.   Testing/Procedures: - none ordered   Follow-Up: At [facility], you and your health needs are our priority.  As part of our continuing mission to provide you with exceptional heart care, we have created designated Provider Care Teams.  These Care Teams include your primary Cardiologist (physician) and Advanced Practice Providers (APPs -  Physician Assistants and Nurse Practitioners) who all work together to provide you with the care you need, when you need it.  We recommend signing up for the patient portal called "MyChart".  Sign up information is provided on this After Visit Summary.  MyChart is used to connect with patients for Virtual Visits (Telemedicine).  Patients are able to view lab/test results, encounter notes, upcoming appointments, etc.  Non-urgent messages can be sent to your provider as well.   To learn more about what you can do with MyChart, go to [email or web address].    Your next appointment:   6 month(s)  The format for your next appointment:   In Person  Provider:   You may see Muhammad Arida, MD or one of the following Advanced Practice Providers on your designated Care Team:   Christopher Berge, NP Ryan Dunn, PA-C Cadence Furth, PA-C    Other Instructions N/a  

## 2021-01-26 NOTE — Progress Notes (Signed)
Cardiology Office Note   Date:  01/26/2021   ID:  Robert Frye, DOB 1931-03-31, MRN [id number]  PCP:  Baxter Hire, MD  Cardiologist:   Kathlyn Sacramento, MD   Chief Complaint  Patient presents with   Other    3 month f/u no complaints today. Meds reviewed verbally with pt.      History of Present Illness: Robert Frye is a 87 y.o. male who presents for follow-up visit regarding persistent atrial fibrillation and chronic diastolic heart failure with pulmonary hypertension.  He has known history of essential hypertension, hyperlipidemia, chronic kidney disease, hypothyroidism and mild dementia.  He was hospitalized in November of 2021 with atrial fibrillation with RVR complicated by heart failure.  Troponin was mildly elevated thought to be due to demand ischemia.  Echocardiogram showed normal LV systolic function, severe eccentric left ventricular hypertrophy, severe left atrial enlargement, mild mitral regurgitation.  The patient improved with diuresis and rate control.  He was started on anticoagulation with Eliquis.  He underwent successful cardioversion in December.  He underwent Lexiscan Myoview in January of this year which showed no evidence of ischemia.  EF was mildly reduced but likely was not accurate.  He had recurrent atrial fibrillation that required treatment with amiodarone. He was hospitalized again and March with heart failure.  Echo showed an EF of 50 to 55% with severe concentric LVH, moderate size left pleural effusion and mild mitral regurgitation.  He underwent another cardioversion. He had subsequent bradycardia that necessitated discontinuation of metoprolol. He was seen in August for orthostatic dizziness.  He is known to have mild bradycardia but I elected to keep him on amiodarone given that the risk of recurrent atrial fibrillation was too high.  He subsequently had left side thoracentesis at the end of August with significant improvement in shortness  of breath.  He has been doing well with no recent chest pain or worsening dyspnea.  No palpitations.  He continues to have dizziness with standing up but no syncope or presyncope.   Past Medical History:  Diagnosis Date   (HFpEF) heart failure with preserved ejection fraction (HCC)    CKD (chronic kidney disease), stage III ([place])    Dementia ([place])    History of stress test    a. 03/2020 MV: No ischemia/infarct. EF 40% (55-60% by echo 01/2020).   Hyperlipidemia    Hypertension    Hypertrophic cardiomyopathy ([place])    a. 01/2020 Echo: EF 55-60%, no rwma. Sev eccentric LVH of apical and basal-septal segments. gr1 DD. Mildly enlarged RV w/ nl RV fxn. sev dil LA, mod dil RA. Mild MR.   Hypothyroidism    Persistent atrial fibrillation ([place])    a. Dx 01/2020 s/p DCCV 02/2020; b. 02/2020 Recurrent Afib noted-->rate controlled-->amio started 05/2020 in setting of ongoing CHF; c. CHA2DS2VASc = 4-->Eliquis 2.5 BID.    Past Surgical History:  Procedure Laterality Date   CARDIOVERSION N/A 03/14/2020   Procedure: CARDIOVERSION;  Surgeon: Wellington Hampshire, MD;  Location: [facility] ORS;  Service: Cardiovascular;  Laterality: N/A;   CARDIOVERSION N/A 05/31/2020   Procedure: CARDIOVERSION;  Surgeon: Kate Sable, MD;  Location: [facility] ORS;  Service: Cardiovascular;  Laterality: N/A;   HERNIA REPAIR       Current Outpatient Medications  Medication Sig Dispense Refill   amiodarone (PACERONE) 100 MG tablet Take 1 tablet (100 mg total) by mouth daily. 90 tablet 3   apixaban (ELIQUIS) 2.5 MG TABS tablet Take 1 tablet (2.5 mg total)  by mouth 2 (two) times daily. 180 tablet 3   atorvastatin (LIPITOR) 10 MG tablet Take 10 mg by mouth daily.     Cholecalciferol (DIALYVITE VITAMIN D 5000) 125 MCG (5000 UT) capsule Take 2,000 Units by mouth daily.     cyanocobalamin 1000 MCG tablet Take 1,000 mcg by mouth daily.     furosemide (LASIX) 20 MG tablet Take 0.5 tablets (10 mg total) by mouth daily. (Patient taking  differently: Take 20 mg by mouth daily.) 30 tablet    levothyroxine (SYNTHROID) 75 MCG tablet Take 75 mcg by mouth daily before breakfast.     Naphazoline HCl (CLEAR EYES OP) Place 1 drop into both eyes daily as needed (dry/itchy eyes).     potassium chloride SA (KLOR-CON M20) 20 MEQ tablet Take 1 tablet (20 mEq total) by mouth daily. 90 tablet 3   No current facility-administered medications for this visit.    Allergies:   Jardiance [empagliflozin]    Social History:  The patient  reports that he has quit smoking. He has never used smokeless tobacco. He reports that he does not currently use alcohol. He reports that he does not use drugs.   Family History:  The patient's family history includes Heart attack in his father; Heart disease in his brother and sister.    ROS:  Please see the history of present illness.   Otherwise, review of systems are positive for none.   All other systems are reviewed and negative.    PHYSICAL EXAM: VS:  BP 108/60 (BP Location: Left Arm, Patient Position: Sitting, Cuff Size: Normal)   Pulse (!) 56   Ht 5\' 6"  (1.676 m)   Wt 135 lb 4 oz (61.3 kg)   SpO2 91%   BMI 21.83 kg/m  , BMI Body mass index is 21.83 kg/m. GEN: Well nourished, well developed, in no acute distress  HEENT: normal  Neck: no JVD, carotid bruits, or masses Cardiac: RRR; no murmurs, rubs, or gallops,no edema  Respiratory:  clear to auscultation bilaterally, normal work of breathing GI: soft, nontender, nondistended, + BS MS: no deformity or atrophy  Skin: warm and dry, no rash Neuro:  Strength and sensation are intact Psych: euthymic mood, full affect   EKG:  EKG is ordered today. The ekg ordered today demonstrates sinus bradycardia with first-degree AV block, left axis deviation, LVH with repolarization abnormalities    Recent Labs: 05/27/2020: B Natriuretic Peptide 2,489.7 06/01/2020: Magnesium 2.3 06/13/2020: ALT 32; Hemoglobin 16.2; Platelets 222; TSH 4.450 08/09/2020: BUN  22; Creatinine, Ser 1.67; Potassium 4.3; Sodium 137    Lipid Panel    Component Value Date/Time   CHOL 139 02/14/2020 0410   TRIG 60 02/14/2020 0410   HDL 42 02/14/2020 0410   CHOLHDL 3.3 02/14/2020 0410   VLDL 12 02/14/2020 0410   LDLCALC 85 02/14/2020 0410      Wt Readings from Last 3 Encounters:  01/26/21 135 lb 4 oz (61.3 kg)  10/26/20 138 lb 4 oz (62.7 kg)  09/26/20 138 lb 6 oz (62.8 kg)       No flowsheet data found.    ASSESSMENT AND PLAN:  1.  Chronic diastolic heart failure with pulmonary hypertension: He appears to be euvolemic on small dose furosemide 20 mg daily.  I reviewed his labs from 3 days ago which showed stable renal function with a creatinine of 1.8.   2.  Persistent atrial fibrillation: He is maintaining in sinus rhythm with small dose amiodarone.  He is mildly  bradycardic but overall stable.  He is not on any rate control medication with the exception of small dose amiodarone which is being used for rhythm control given difficulty controlling his atrial fibrillation.  Recommend continuing same dose of amiodarone.  Continue low-dose Eliquis considering his age and renal function.    3.  Hyperlipidemia: On atorvastatin 10 mg daily.  4.  Chronic kidney disease: Stable with the most recent creatinine of 1.8.  5.  The patient asked if he can reschedule his cataract surgery and I do not see a contraindication from a cardiac standpoint.   Disposition:   FU with me in 6 months  Signed,  [name], MD  01/26/2021 10:06 AM    [facility]

## 2021-02-09 ENCOUNTER — Other Ambulatory Visit: Payer: Self-pay | Admitting: Pharmacist

## 2021-02-09 MED ORDER — APIXABAN 2.5 MG PO TABS: 2.5000 mg | ORAL_TABLET | Freq: Two times a day (BID) | ORAL | 1 refills | Status: DC

## 2021-03-06 ENCOUNTER — Encounter: Payer: Self-pay | Admitting: Ophthalmology

## 2021-03-08 NOTE — Discharge Instructions (Signed)

## 2021-03-13 ENCOUNTER — Other Ambulatory Visit: Payer: Self-pay

## 2021-03-13 ENCOUNTER — Ambulatory Visit: Payer: Medicare Other | Admitting: Anesthesiology

## 2021-03-13 ENCOUNTER — Encounter
Admission: RE | Disposition: A | Discharge status: Home / Self Care | Payer: Self-pay | Source: Home / Self Care | Attending: Ophthalmology

## 2021-03-13 ENCOUNTER — Encounter: Payer: Self-pay | Admitting: Ophthalmology

## 2021-03-13 ENCOUNTER — Ambulatory Visit
Admission: RE | Admit: 2021-03-13 | Discharge: 2021-03-13 | Disposition: A | Discharge status: Home / Self Care | Payer: Medicare Other | Attending: Ophthalmology | Admitting: Ophthalmology

## 2021-03-13 DIAGNOSIS — E039 Hypothyroidism, unspecified: Secondary | ICD-10-CM | POA: Diagnosis not present

## 2021-03-13 DIAGNOSIS — N183 Chronic kidney disease, stage 3 unspecified: Secondary | ICD-10-CM | POA: Diagnosis not present

## 2021-03-13 DIAGNOSIS — Z87891 Personal history of nicotine dependence: Secondary | ICD-10-CM | POA: Diagnosis not present

## 2021-03-13 DIAGNOSIS — I5032 Chronic diastolic (congestive) heart failure: Secondary | ICD-10-CM | POA: Insufficient documentation

## 2021-03-13 DIAGNOSIS — Z7901 Long term (current) use of anticoagulants: Secondary | ICD-10-CM | POA: Insufficient documentation

## 2021-03-13 DIAGNOSIS — I4819 Other persistent atrial fibrillation: Secondary | ICD-10-CM | POA: Insufficient documentation

## 2021-03-13 DIAGNOSIS — H2511 Age-related nuclear cataract, right eye: Secondary | ICD-10-CM | POA: Insufficient documentation

## 2021-03-13 DIAGNOSIS — I130 Hypertensive heart and chronic kidney disease with heart failure and stage 1 through stage 4 chronic kidney disease, or unspecified chronic kidney disease: Secondary | ICD-10-CM | POA: Diagnosis not present

## 2021-03-13 HISTORY — PX: CATARACT EXTRACTION W/PHACO: SHX586

## 2021-03-13 SURGERY — PHACOEMULSIFICATION, CATARACT, WITH IOL INSERTION
Anesthesia: Monitor Anesthesia Care | Site: Eye | Laterality: Right

## 2021-03-13 MED ORDER — SIGHTPATH DOSE#1 NA CHONDROIT SULF-NA HYALURON 40-17 MG/ML IO SOLN
INTRAOCULAR | Status: DC | PRN
  Administered 2021-03-13: 1 mL via INTRAOCULAR

## 2021-03-13 MED ORDER — SIGHTPATH DOSE#1 BSS IO SOLN
INTRAOCULAR | Status: DC | PRN
  Administered 2021-03-13: 12:00:00 101 mL via OPHTHALMIC

## 2021-03-13 MED ORDER — MOXIFLOXACIN HCL 0.5 % OP SOLN
OPHTHALMIC | Status: DC | PRN
  Administered 2021-03-13: 0.2 mL via OPHTHALMIC

## 2021-03-13 MED ORDER — BRIMONIDINE TARTRATE-TIMOLOL 0.2-0.5 % OP SOLN
OPHTHALMIC | Status: DC | PRN
  Administered 2021-03-13: 1 [drp] via OPHTHALMIC

## 2021-03-13 MED ORDER — TETRACAINE HCL 0.5 % OP SOLN
1.0000 [drp] | OPHTHALMIC | Status: DC | PRN
  Administered 2021-03-13 (×3): 1 [drp] via OPHTHALMIC

## 2021-03-13 MED ORDER — ARMC OPHTHALMIC DILATING DROPS
1.0000 "application " | OPHTHALMIC | Status: DC | PRN
  Administered 2021-03-13 (×3): 1 via OPHTHALMIC

## 2021-03-13 MED ORDER — FENTANYL CITRATE (PF) 100 MCG/2ML IJ SOLN
INTRAMUSCULAR | Status: DC | PRN
  Administered 2021-03-13: 50 ug via INTRAVENOUS

## 2021-03-13 MED ORDER — LACTATED RINGERS IV SOLN: INTRAVENOUS | Status: DC

## 2021-03-13 MED ORDER — SIGHTPATH DOSE#1 BSS IO SOLN
INTRAOCULAR | Status: DC | PRN
  Administered 2021-03-13: 15 mL

## 2021-03-13 MED ORDER — DEXMEDETOMIDINE (PRECEDEX) IN NS 20 MCG/5ML (4 MCG/ML) IV SYRINGE
PREFILLED_SYRINGE | INTRAVENOUS | Status: DC | PRN
  Administered 2021-03-13: 10 ug via INTRAVENOUS

## 2021-03-13 MED ORDER — SIGHTPATH DOSE#1 BSS IO SOLN
INTRAOCULAR | Status: DC | PRN
  Administered 2021-03-13: 1 mL via INTRAMUSCULAR

## 2021-03-13 SURGICAL SUPPLY — 8 items
GLOVE SURG ENC TEXT LTX SZ8 (GLOVE) ×3 IMPLANT
GLOVE SURG TRIUMPH 8.0 PF LTX (GLOVE) ×3 IMPLANT
LENS IOL TECNIS EYHANCE 20.5 (Intraocular Lens) ×2 IMPLANT
NDL FILTER BLUNT 18X1 1/2 (NEEDLE) ×1 IMPLANT
NEEDLE FILTER BLUNT 18X 1/2SAF (NEEDLE) ×2
NEEDLE FILTER BLUNT 18X1 1/2 (NEEDLE) ×1 IMPLANT
SYR 3ML LL SCALE MARK (SYRINGE) ×3 IMPLANT
WATER STERILE IRR 250ML POUR (IV SOLUTION) ×3 IMPLANT

## 2021-03-13 NOTE — Anesthesia Postprocedure Evaluation (Signed)
Anesthesia Post Note  Patient: Robert Frye  Procedure(s) Performed: CATARACT EXTRACTION PHACO AND INTRAOCULAR LENS PLACEMENT (IOC) RIGHT 20.48 01:55.4 (Right: Eye)     Patient location during evaluation: PACU Anesthesia Type: MAC Level of consciousness: awake and alert Pain management: pain level controlled Vital Signs Assessment: post-procedure vital signs reviewed and stable Respiratory status: spontaneous breathing and nonlabored ventilation Cardiovascular status: blood pressure returned to baseline Postop Assessment: no apparent nausea or vomiting Anesthetic complications: no   No notable events documented.  Jacy Brocker [employer]

## 2021-03-13 NOTE — Transfer of Care (Signed)
Immediate Anesthesia Transfer of Care Note  Patient: Robert Frye  Procedure(s) Performed: CATARACT EXTRACTION PHACO AND INTRAOCULAR LENS PLACEMENT (IOC) RIGHT 20.48 01:55.4 (Right: Eye)  Patient Location: PACU  Anesthesia Type: MAC  Level of Consciousness: awake, alert  and patient cooperative  Airway and Oxygen Therapy: Patient Spontanous Breathing and Patient connected to supplemental oxygen  Post-op Assessment: Post-op Vital signs reviewed, Patient's Cardiovascular Status Stable, Respiratory Function Stable, Patent Airway and No signs of Nausea or vomiting  Post-op Vital Signs: Reviewed and stable  Complications: No notable events documented.

## 2021-03-13 NOTE — Op Note (Signed)
PREOPERATIVE DIAGNOSIS:  Nuclear sclerotic cataract of the right eye.   POSTOPERATIVE DIAGNOSIS:  H025.11 Cataract   OPERATIVE PROCEDURE:ORPROCALL@   SURGEON:  Galen Manila, MD.   ANESTHESIA:  Anesthesiologist: Fletcher Anon, MD CRNA: Michaele Offer, CRNA  1.      Managed anesthesia care. 2.      0.2ml of Shugarcaine was instilled in the eye following the paracentesis.   COMPLICATIONS:  None.   TECHNIQUE:   Stop and chop   DESCRIPTION OF PROCEDURE:  The patient was examined and consented in the preoperative holding area where the aforementioned topical anesthesia was applied to the right eye and then brought back to the Operating Room where the right eye was prepped and draped in the usual sterile ophthalmic fashion and a lid speculum was placed. A paracentesis was created with the side port blade and the anterior chamber was filled with viscoelastic. A near clear corneal incision was performed with the steel keratome. A continuous curvilinear capsulorrhexis was performed with a cystotome followed by the capsulorrhexis forceps. Hydrodissection and hydrodelineation were carried out with BSS on a blunt cannula. The lens was removed in a stop and chop  technique and the remaining cortical material was removed with the irrigation-aspiration handpiece. The capsular bag was inflated with viscoelastic and the Technis [id number]  lens was placed in the capsular bag without complication. The remaining viscoelastic was removed from the eye with the irrigation-aspiration handpiece. The wounds were hydrated. The anterior chamber was flushed with BSS and the eye was inflated to physiologic pressure. 0.52ml of Vigamox was placed in the anterior chamber. The wounds were found to be water tight. The eye was dressed with Combigan. The patient was given protective glasses to wear throughout the day and a shield with which to sleep tonight. The patient was also given drops with which to begin a drop regimen today and  will follow-up with me in one day. Implant Name Type Inv. Item Serial No. Manufacturer Lot No. LRB No. Used Action  LENS IOL TECNIS EYHANCE 20.5 - [id number] Intraocular Lens LENS IOL TECNIS EYHANCE 20.5 [id number] [employer]   Right 1 Implanted   Procedure(s): CATARACT EXTRACTION PHACO AND INTRAOCULAR LENS PLACEMENT (IOC) RIGHT 20.48 01:55.4 (Right)  Electronically signed: Galen Manila 03/13/2021 12:21 PM

## 2021-03-13 NOTE — H&P (Signed)
[facility]   Primary Care Physician:  Gracelyn Nurse, MD Ophthalmologist: Dr. Druscilla Brownie  Pre-Procedure History & Physical: HPI:  Robert Frye is a 87 y.o. male here for cataract surgery.   Past Medical History:  Diagnosis Date   (HFpEF) heart failure with preserved ejection fraction (HCC)    CKD (chronic kidney disease), stage III (HCC)    Dementia (HCC)    History of stress test    a. 03/2020 MV: No ischemia/infarct. EF 40% (55-60% by echo 01/2020).   Hyperlipidemia    Hypertension    Hypertrophic cardiomyopathy (HCC)    a. 01/2020 Echo: EF 55-60%, no rwma. Sev eccentric LVH of apical and basal-septal segments. gr1 DD. Mildly enlarged RV w/ nl RV fxn. sev dil LA, mod dil RA. Mild MR.   Hypothyroidism    Persistent atrial fibrillation (HCC)    a. Dx 01/2020 s/p DCCV 02/2020; b. 02/2020 Recurrent Afib noted-->rate controlled-->amio started 05/2020 in setting of ongoing CHF; c. CHA2DS2VASc = 4-->Eliquis 2.5 BID.    Past Surgical History:  Procedure Laterality Date   CARDIOVERSION N/A 03/14/2020   Procedure: CARDIOVERSION;  Surgeon: Iran Ouch, MD;  Location: [facility];  Service: Cardiovascular;  Laterality: N/A;   CARDIOVERSION N/A 05/31/2020   Procedure: CARDIOVERSION;  Surgeon: Debbe Odea, MD;  Location: [facility];  Service: Cardiovascular;  Laterality: N/A;   HERNIA REPAIR      Prior to Admission medications   Medication Sig Start Date End Date Taking? Authorizing Provider  amiodarone (PACERONE) 100 MG tablet Take 1 tablet (100 mg total) by mouth daily. 08/31/20 08/26/21 Yes Visser, Jacquelyn D, PA-C  apixaban (ELIQUIS) 2.5 MG TABS tablet Take 1 tablet (2.5 mg total) by mouth 2 (two) times daily. 02/09/21  Yes Iran Ouch, MD  atorvastatin (LIPITOR) 10 MG tablet Take 10 mg by mouth daily. 11/17/14  Yes [provider]  Cholecalciferol (DIALYVITE VITAMIN D 5000) 125 MCG (5000 UT) capsule Take 2,000 Units by mouth daily.   Yes [provider]  cyanocobalamin 1000 MCG tablet Take 1,000 mcg by mouth daily.   Yes [provider]  furosemide (LASIX) 20 MG tablet Take 0.5 tablets (10 mg total) by mouth daily. Patient taking differently: Take 20 mg by mouth daily. 08/11/20 03/13/21 Yes Delma Freeze, FNP  levothyroxine (SYNTHROID) 75 MCG tablet Take 75 mcg by mouth daily before breakfast. 11/12/14  Yes [provider]  Naphazoline HCl (CLEAR EYES OP) Place 1 drop into both eyes daily as needed (dry/itchy eyes).   Yes [provider]  potassium chloride SA (KLOR-CON M20) 20 MEQ tablet Take 1 tablet (20 mEq total) by mouth daily. 08/31/20 08/26/21 Yes Visser, Jacquelyn D, PA-C    Allergies as of 03/02/2021 - Review Complete 01/26/2021  Allergen Reaction Noted   Jardiance [empagliflozin] Other (See Comments) 09/26/2020    Family History  Problem Relation Age of Onset   Heart attack Father    Heart disease Sister    Heart disease Brother     Social History   Socioeconomic History   Marital status: Married    Spouse name: Not on file   Number of children: Not on file   Years of education: Not on file   Highest education level: Not on file  Occupational History   Not on file  Tobacco Use   Smoking status: Former    Types: Cigarettes   Smokeless tobacco: Never   Tobacco comments:    Smoked as a teenager  Vaping Use   Vaping Use: Never used  Substance and Sexual Activity   Alcohol use: Not Currently   Drug use: Never   Sexual activity: Not on file  Other Topics Concern   Not on file  Social History Narrative   Not on file   Social Determinants of Health   Financial Resource Strain: Not on file  Food Insecurity: Not on file  Transportation Needs: Not on file  Physical Activity: Not on file  Stress: Not on file  Social Connections: Not on file  Intimate Partner Violence: Not on file    Review of Systems: See HPI, otherwise negative ROS  Physical Exam: BP (!) 151/77     Pulse 71    Temp (!) 97.5 F (36.4 C) (Temporal)    Resp 18    Ht 5\' 6"  (1.676 m)    Wt 61.2 kg    SpO2 94%    BMI 21.79 kg/m  General:   Alert, cooperative in NAD Head:  Normocephalic and atraumatic. Respiratory:  Normal work of breathing. Cardiovascular:  RRR  Impression/Plan: Robert Frye is here for cataract surgery.  Risks, benefits, limitations, and alternatives regarding cataract surgery have been reviewed with the patient.  Questions have been answered.  All parties agreeable.   Birder Robson, MD  03/13/2021, 11:47 AM

## 2021-03-13 NOTE — Anesthesia Preprocedure Evaluation (Addendum)
Anesthesia Evaluation  Patient identified by MRN, date of birth, ID band Patient awake    Reviewed: Allergy & Precautions, NPO status , Patient's Chart, lab work & pertinent test results  Airway Mallampati: III  TM Distance: >3 FB Neck ROM: Full    Dental  (+) Dental Advidsory Given   Pulmonary shortness of breath, COPD,  COPD inhaler, former smoker,    Pulmonary exam normal        Cardiovascular hypertension, +CHF  Normal cardiovascular exam+ dysrhythmias Atrial Fibrillation   ECHO 05/2020 EF 50-55% Mild MR   Neuro/Psych PSYCHIATRIC DISORDERS Dementia negative neurological ROS     GI/Hepatic negative GI ROS, Neg liver ROS,   Endo/Other  Hypothyroidism   Renal/GU Renal InsufficiencyRenal disease  negative genitourinary   Musculoskeletal negative musculoskeletal ROS (+)   Abdominal Normal abdominal exam  (+)   Peds negative pediatric ROS (+)  Hematology negative hematology ROS (+) Takes Eliquis, last dose 03/13/2021   Anesthesia Other Findings   Reproductive/Obstetrics                            Anesthesia Physical  Anesthesia Plan  ASA: 3  Anesthesia Plan: MAC   Post-op Pain Management: Minimal or no pain anticipated   Induction: Intravenous  PONV Risk Score and Plan: 1 and TIVA, Midazolam and Treatment may vary due to age or medical condition  Airway Management Planned: Nasal Cannula and Natural Airway  Additional Equipment:   Intra-op Plan:   Post-operative Plan:   Informed Consent: I have reviewed the patients History and Physical, chart, labs and discussed the procedure including the risks, benefits and alternatives for the proposed anesthesia with the patient or authorized representative who has indicated his/her understanding and acceptance.     Dental advisory given and Consent reviewed with POA  Plan Discussed with: CRNA  Anesthesia Plan Comments:         Anesthesia Quick Evaluation

## 2021-03-14 ENCOUNTER — Encounter: Payer: Self-pay | Admitting: Ophthalmology

## 2021-03-29 NOTE — Discharge Instructions (Signed)

## 2021-04-03 ENCOUNTER — Ambulatory Visit: Payer: Medicare Other | Admitting: Anesthesiology

## 2021-04-03 ENCOUNTER — Ambulatory Visit
Admission: RE | Admit: 2021-04-03 | Discharge: 2021-04-03 | Disposition: A | Discharge status: Home / Self Care | Payer: Medicare Other | Attending: Ophthalmology | Admitting: Ophthalmology

## 2021-04-03 ENCOUNTER — Encounter: Payer: Self-pay | Admitting: Ophthalmology

## 2021-04-03 ENCOUNTER — Other Ambulatory Visit: Payer: Self-pay

## 2021-04-03 ENCOUNTER — Encounter
Admission: RE | Disposition: A | Discharge status: Home / Self Care | Payer: Self-pay | Source: Home / Self Care | Attending: Ophthalmology

## 2021-04-03 DIAGNOSIS — I5032 Chronic diastolic (congestive) heart failure: Secondary | ICD-10-CM | POA: Diagnosis not present

## 2021-04-03 DIAGNOSIS — I130 Hypertensive heart and chronic kidney disease with heart failure and stage 1 through stage 4 chronic kidney disease, or unspecified chronic kidney disease: Secondary | ICD-10-CM | POA: Insufficient documentation

## 2021-04-03 DIAGNOSIS — H2512 Age-related nuclear cataract, left eye: Secondary | ICD-10-CM | POA: Insufficient documentation

## 2021-04-03 DIAGNOSIS — Z87891 Personal history of nicotine dependence: Secondary | ICD-10-CM | POA: Insufficient documentation

## 2021-04-03 DIAGNOSIS — N183 Chronic kidney disease, stage 3 unspecified: Secondary | ICD-10-CM | POA: Diagnosis not present

## 2021-04-03 HISTORY — PX: CATARACT EXTRACTION W/PHACO: SHX586

## 2021-04-03 SURGERY — PHACOEMULSIFICATION, CATARACT, WITH IOL INSERTION
Anesthesia: Monitor Anesthesia Care | Site: Eye | Laterality: Left

## 2021-04-03 MED ORDER — GLYCOPYRROLATE 0.2 MG/ML IJ SOLN
INTRAMUSCULAR | Status: DC | PRN
Start: 2021-04-03 — End: 2021-04-03
  Administered 2021-04-03: .2 mg via INTRAVENOUS

## 2021-04-03 MED ORDER — BRIMONIDINE TARTRATE-TIMOLOL 0.2-0.5 % OP SOLN
OPHTHALMIC | Status: DC | PRN
  Administered 2021-04-03: 1 [drp] via OPHTHALMIC

## 2021-04-03 MED ORDER — FENTANYL CITRATE (PF) 100 MCG/2ML IJ SOLN
INTRAMUSCULAR | Status: DC | PRN
Start: 2021-04-03 — End: 2021-04-03
  Administered 2021-04-03 (×2): 25 ug via INTRAVENOUS

## 2021-04-03 MED ORDER — ONDANSETRON HCL 4 MG/2ML IJ SOLN: 4.0000 mg | Freq: Once | INTRAMUSCULAR | Status: DC | PRN

## 2021-04-03 MED ORDER — TETRACAINE HCL 0.5 % OP SOLN
1.0000 [drp] | OPHTHALMIC | Status: DC | PRN
  Administered 2021-04-03 (×3): 1 [drp] via OPHTHALMIC

## 2021-04-03 MED ORDER — ACETAMINOPHEN 325 MG PO TABS: 325.0000 mg | ORAL_TABLET | ORAL | Status: DC | PRN

## 2021-04-03 MED ORDER — SIGHTPATH DOSE#1 BSS IO SOLN
INTRAOCULAR | Status: DC | PRN
  Administered 2021-04-03: 15 mL via INTRAOCULAR

## 2021-04-03 MED ORDER — MOXIFLOXACIN HCL 0.5 % OP SOLN
OPHTHALMIC | Status: DC | PRN
  Administered 2021-04-03: 0.2 mL via OPHTHALMIC

## 2021-04-03 MED ORDER — SIGHTPATH DOSE#1 BSS IO SOLN
INTRAOCULAR | Status: DC | PRN
  Administered 2021-04-03: 2 mL

## 2021-04-03 MED ORDER — ACETAMINOPHEN 160 MG/5ML PO SOLN: 325.0000 mg | ORAL | Status: DC | PRN

## 2021-04-03 MED ORDER — DEXMEDETOMIDINE (PRECEDEX) IN NS 20 MCG/5ML (4 MCG/ML) IV SYRINGE
PREFILLED_SYRINGE | INTRAVENOUS | Status: DC | PRN
  Administered 2021-04-03 (×2): 5 ug via INTRAVENOUS

## 2021-04-03 MED ORDER — SIGHTPATH DOSE#1 BSS IO SOLN
INTRAOCULAR | Status: DC | PRN
  Administered 2021-04-03: 71 mL via OPHTHALMIC

## 2021-04-03 MED ORDER — SIGHTPATH DOSE#1 NA CHONDROIT SULF-NA HYALURON 40-17 MG/ML IO SOLN
INTRAOCULAR | Status: DC | PRN
  Administered 2021-04-03: 1 mL via INTRAOCULAR

## 2021-04-03 MED ORDER — ARMC OPHTHALMIC DILATING DROPS
1.0000 | OPHTHALMIC | Status: DC | PRN
Start: 2021-04-03 — End: 2021-04-03
  Administered 2021-04-03 (×3): 1 via OPHTHALMIC

## 2021-04-03 SURGICAL SUPPLY — 12 items
CANNULA ANT/CHMB 27G (MISCELLANEOUS) IMPLANT
CANNULA ANT/CHMB 27GA (MISCELLANEOUS) IMPLANT
CATARACT SUITE SIGHTPATH (MISCELLANEOUS) ×2 IMPLANT
FEE CATARACT SUITE SIGHTPATH (MISCELLANEOUS) ×1 IMPLANT
GLOVE SURG ENC TEXT LTX SZ8 (GLOVE) ×2 IMPLANT
GLOVE SURG TRIUMPH 8.0 PF LTX (GLOVE) ×2 IMPLANT
LENS IOL TECNIS EYHANCE 21.0 (Intraocular Lens) ×1 IMPLANT
NDL FILTER BLUNT 18X1 1/2 (NEEDLE) ×1 IMPLANT
NEEDLE FILTER BLUNT 18X 1/2SAF (NEEDLE) ×1
NEEDLE FILTER BLUNT 18X1 1/2 (NEEDLE) ×1 IMPLANT
SYR 3ML LL SCALE MARK (SYRINGE) ×2 IMPLANT
WATER STERILE IRR 250ML POUR (IV SOLUTION) ×2 IMPLANT

## 2021-04-03 NOTE — Anesthesia Preprocedure Evaluation (Signed)
Anesthesia Evaluation  Patient identified by MRN, date of birth, ID band Patient awake    Reviewed: Allergy & Precautions, NPO status , Patient's Chart, lab work & pertinent test results  Airway Mallampati: III  TM Distance: >3 FB Neck ROM: Full    Dental  (+) Dental Advidsory Given   Pulmonary shortness of breath, COPD,  COPD inhaler, former smoker,    Pulmonary exam normal        Cardiovascular hypertension, +CHF  Normal cardiovascular exam+ dysrhythmias Atrial Fibrillation   ECHO 05/2020 EF 50-55% Mild MR   Neuro/Psych PSYCHIATRIC DISORDERS Dementia negative neurological ROS     GI/Hepatic   Endo/Other  Hypothyroidism   Renal/GU Renal InsufficiencyRenal disease     Musculoskeletal   Abdominal   Peds negative pediatric ROS (+)  Hematology Takes Eliquis, last dose 03/13/2021   Anesthesia Other Findings   Reproductive/Obstetrics                             Anesthesia Physical  Anesthesia Plan  ASA: 3  Anesthesia Plan: MAC   Post-op Pain Management: Minimal or no pain anticipated   Induction: Intravenous  PONV Risk Score and Plan: 1 and TIVA, Midazolam and Treatment may vary due to age or medical condition  Airway Management Planned: Nasal Cannula and Natural Airway  Additional Equipment:   Intra-op Plan:   Post-operative Plan:   Informed Consent: I have reviewed the patients History and Physical, chart, labs and discussed the procedure including the risks, benefits and alternatives for the proposed anesthesia with the patient or authorized representative who has indicated his/her understanding and acceptance.     Dental advisory given and Consent reviewed with POA  Plan Discussed with: CRNA  Anesthesia Plan Comments:         Anesthesia Quick Evaluation

## 2021-04-03 NOTE — Op Note (Signed)
PREOPERATIVE DIAGNOSIS:  Nuclear sclerotic cataract of the left eye.   POSTOPERATIVE DIAGNOSIS:  Nuclear sclerotic cataract of the left eye.   OPERATIVE PROCEDURE:ORPROCALL@   SURGEON:  Galen Manila, MD.   ANESTHESIA:  Anesthesiologist: Jola Babinski, MD CRNA: Michaele Offer, CRNA  1.      Managed anesthesia care. 2.     0.43ml of Shugarcaine was instilled following the paracentesis   COMPLICATIONS:  None.   TECHNIQUE:   Stop and chop   DESCRIPTION OF PROCEDURE:  The patient was examined and consented in the preoperative holding area where the aforementioned topical anesthesia was applied to the left eye and then brought back to the Operating Room where the left eye was prepped and draped in the usual sterile ophthalmic fashion and a lid speculum was placed. A paracentesis was created with the side port blade and the anterior chamber was filled with viscoelastic. A near clear corneal incision was performed with the steel keratome. A continuous curvilinear capsulorrhexis was performed with a cystotome followed by the capsulorrhexis forceps. Hydrodissection and hydrodelineation were carried out with BSS on a blunt cannula. The lens was removed in a stop and chop  technique and the remaining cortical material was removed with the irrigation-aspiration handpiece. The capsular bag was inflated with viscoelastic and the Technis [id number] lens was placed in the capsular bag without complication. The remaining viscoelastic was removed from the eye with the irrigation-aspiration handpiece. The wounds were hydrated. The anterior chamber was flushed with BSS and the eye was inflated to physiologic pressure. 0.36ml Vigamox was placed in the anterior chamber. The wounds were found to be water tight. The eye was dressed with Combigan. The patient was given protective glasses to wear throughout the day and a shield with which to sleep tonight. The patient was also given drops with which to begin a drop regimen  today and will follow-up with me in one day. Implant Name Type Inv. Item Serial No. Manufacturer Lot No. LRB No. Used Action  LENS IOL TECNIS EYHANCE 21.0 - [id number] Intraocular Lens LENS IOL TECNIS EYHANCE 21.0 [id number] [employer]  Left 1 Implanted    Procedure(s): CATARACT EXTRACTION PHACO AND INTRAOCULAR LENS PLACEMENT (IOC) LEFT 12.07 00:58.5 (Left)  Electronically signed: Galen Manila 04/03/2021 8:43 AM

## 2021-04-03 NOTE — Anesthesia Postprocedure Evaluation (Signed)
Anesthesia Post Note  Patient: Robert Frye  Procedure(s) Performed: CATARACT EXTRACTION PHACO AND INTRAOCULAR LENS PLACEMENT (IOC) LEFT 12.07 00:58.5 (Left: Eye)     Patient location during evaluation: PACU Anesthesia Type: MAC Level of consciousness: awake Pain management: pain level controlled Vital Signs Assessment: post-procedure vital signs reviewed and stable Respiratory status: respiratory function stable Cardiovascular status: stable Postop Assessment: no signs of nausea or vomiting Anesthetic complications: no  Vagal event in PACU - treated with supine position, IV fluid and glycopyrrolate. Extra time spent in PACU monitoring. Patient stable and alert talking with staff.   No notable events documented.  Veda Canning

## 2021-04-03 NOTE — Transfer of Care (Signed)
Immediate Anesthesia Transfer of Care Note  Patient: Robert Frye  Procedure(s) Performed: CATARACT EXTRACTION PHACO AND INTRAOCULAR LENS PLACEMENT (IOC) LEFT 12.07 00:58.5 (Left: Eye)  Patient Location: PACU  Anesthesia Type: MAC  Level of Consciousness: awake, alert  and patient cooperative  Airway and Oxygen Therapy: Patient Spontanous Breathing and Patient connected to supplemental oxygen  Post-op Assessment: Post-op Vital signs reviewed, Patient's Cardiovascular Status Stable, Respiratory Function Stable, Patent Airway and No signs of Nausea or vomiting  Post-op Vital Signs: Reviewed and stable  Complications: No notable events documented.

## 2021-04-03 NOTE — H&P (Signed)
[place]   Primary Care Physician:  Baxter Hire, MD Ophthalmologist: Dr. George Ina  Pre-Procedure History & Physical: HPI:  Robert Frye is a 88 y.o. male here for cataract surgery.   Past Medical History:  Diagnosis Date   (HFpEF) heart failure with preserved ejection fraction (HCC)    CKD (chronic kidney disease), stage III ([place])    Dementia ([place])    History of stress test    a. 03/2020 MV: No ischemia/infarct. EF 40% (55-60% by echo 01/2020).   Hyperlipidemia    Hypertension    Hypertrophic cardiomyopathy ([place])    a. 01/2020 Echo: EF 55-60%, no rwma. Sev eccentric LVH of apical and basal-septal segments. gr1 DD. Mildly enlarged RV w/ nl RV fxn. sev dil LA, mod dil RA. Mild MR.   Hypothyroidism    Persistent atrial fibrillation ([place])    a. Dx 01/2020 s/p DCCV 02/2020; b. 02/2020 Recurrent Afib noted-->rate controlled-->amio started 05/2020 in setting of ongoing CHF; c. CHA2DS2VASc = 4-->Eliquis 2.5 BID.    Past Surgical History:  Procedure Laterality Date   CARDIOVERSION N/A 03/14/2020   Procedure: CARDIOVERSION;  Surgeon: Wellington Hampshire, MD;  Location: [place] [facility];  Service: Cardiovascular;  Laterality: N/A;   CARDIOVERSION N/A 05/31/2020   Procedure: CARDIOVERSION;  Surgeon: Kate Sable, MD;  Location: [facility];  Service: Cardiovascular;  Laterality: N/A;   CATARACT EXTRACTION W/PHACO Right 03/13/2021   Procedure: CATARACT EXTRACTION PHACO AND INTRAOCULAR LENS PLACEMENT ([place]) RIGHT 20.48 01:55.4;  Surgeon: Birder Robson, MD;  Location: [facility];  Service: Ophthalmology;  Laterality: Right;   HERNIA REPAIR      Prior to Admission medications   Medication Sig Start Date End Date Taking? Authorizing Provider  amiodarone (PACERONE) 100 MG tablet Take 1 tablet (100 mg total) by mouth daily. 08/31/20 08/26/21 Yes Visser, Jacquelyn D, PA-C  apixaban (ELIQUIS) 2.5 MG TABS tablet Take 1 tablet (2.5 mg total) by mouth 2 (two) times daily. 02/09/21  Yes  Wellington Hampshire, MD  atorvastatin (LIPITOR) 10 MG tablet Take 10 mg by mouth daily. 11/17/14  Yes [provider]  Cholecalciferol (DIALYVITE VITAMIN D 5000) 125 MCG (5000 UT) capsule Take 2,000 Units by mouth daily.   Yes [provider]  cyanocobalamin 1000 MCG tablet Take 1,000 mcg by mouth daily.   Yes [provider]  levothyroxine (SYNTHROID) 75 MCG tablet Take 75 mcg by mouth daily before breakfast. 11/12/14  Yes [provider]  Naphazoline HCl (CLEAR EYES OP) Place 1 drop into both eyes daily as needed (dry/itchy eyes).   Yes [provider]  potassium chloride SA (KLOR-CON M20) 20 MEQ tablet Take 1 tablet (20 mEq total) by mouth daily. 08/31/20 08/26/21 Yes Visser, Jacquelyn D, PA-C  furosemide (LASIX) 20 MG tablet Take 0.5 tablets (10 mg total) by mouth daily. Patient taking differently: Take 20 mg by mouth daily. 08/11/20 03/13/21  Alisa Graff, FNP    Allergies as of 03/02/2021 - Review Complete 01/26/2021  Allergen Reaction Noted   Jardiance [empagliflozin] Other (See Comments) 09/26/2020    Family History  Problem Relation Age of Onset   Heart attack Father    Heart disease Sister    Heart disease Brother     Social History   Socioeconomic History   Marital status: Married    Spouse name: Not on file   Number of children: Not on file   Years of education: Not on file   Highest education level: Not on file  Occupational History   Not on file  Tobacco Use   Smoking status: Former    Types: Cigarettes   Smokeless tobacco: Never   Tobacco comments:    Smoked as a teenager  Media planner   Vaping Use: Never used  Substance and Sexual Activity   Alcohol use: Not Currently   Drug use: Never   Sexual activity: Not on file  Other Topics Concern   Not on file  Social History Narrative   Not on file   Social Determinants of Health   Financial Resource Strain: Not on file  Food Insecurity: Not on file  Transportation  Needs: Not on file  Physical Activity: Not on file  Stress: Not on file  Social Connections: Not on file  Intimate Partner Violence: Not on file    Review of Systems: See HPI, otherwise negative ROS  Physical Exam: BP (!) 143/78    Pulse 62    Temp 97.9 F (36.6 C) (Temporal)    Ht 5\' 6"  (1.676 m)    Wt 61.9 kg    SpO2 95%    BMI 22.02 kg/m  General:   Alert, cooperative in NAD Head:  Normocephalic and atraumatic. Respiratory:  Normal work of breathing. Cardiovascular:  RRR  Impression/Plan: Robert Frye is here for cataract surgery.  Risks, benefits, limitations, and alternatives regarding cataract surgery have been reviewed with the patient.  Questions have been answered.  All parties agreeable.   Birder Robson, MD  04/03/2021, 8:16 AM

## 2021-04-04 ENCOUNTER — Encounter: Payer: Self-pay | Admitting: Ophthalmology

## 2021-04-05 ENCOUNTER — Telehealth: Payer: Self-pay | Admitting: Family

## 2021-04-05 NOTE — Telephone Encounter (Signed)
After researching patients application for patient assistnace, patient was not approved for Eliquis patient assistance with Alver Fisher back in 5/22.    Robert Frye, NT

## 2021-05-29 ENCOUNTER — Other Ambulatory Visit: Payer: Self-pay | Admitting: *Deleted

## 2021-05-29 MED ORDER — FUROSEMIDE 20 MG PO TABS: 20.0000 mg | ORAL_TABLET | Freq: Every day | ORAL | 0 refills | Status: DC

## 2021-06-18 ENCOUNTER — Telehealth: Payer: Self-pay | Admitting: Cardiovascular Disease

## 2021-06-18 ENCOUNTER — Other Ambulatory Visit: Payer: Self-pay

## 2021-06-18 ENCOUNTER — Encounter: Payer: Self-pay | Admitting: Emergency Medicine

## 2021-06-18 ENCOUNTER — Telehealth: Payer: Self-pay | Admitting: Physician Assistant

## 2021-06-18 ENCOUNTER — Ambulatory Visit
Admission: RE | Admit: 2021-06-18 | Discharge: 2021-06-18 | Disposition: A | Discharge status: Home / Self Care | Payer: Medicare Other | Source: Ambulatory Visit | Attending: Physician Assistant | Admitting: Physician Assistant

## 2021-06-18 ENCOUNTER — Ambulatory Visit (INDEPENDENT_AMBULATORY_CARE_PROVIDER_SITE_OTHER): Payer: Medicare Other | Admitting: Physician Assistant

## 2021-06-18 ENCOUNTER — Encounter: Payer: Self-pay | Admitting: Physician Assistant

## 2021-06-18 ENCOUNTER — Emergency Department: Payer: Medicare Other

## 2021-06-18 ENCOUNTER — Inpatient Hospital Stay
Admission: EM | Admit: 2021-06-18 | Discharge: 2021-06-22 | DRG: 291 | Disposition: A | Discharge status: Skilled Nursing Facility | Payer: Medicare Other | Source: Ambulatory Visit | Attending: Hospitalist | Admitting: Hospitalist

## 2021-06-18 VITALS — BP 130/64 | HR 63 | Ht 66.0 in | Wt 134.4 lb

## 2021-06-18 DIAGNOSIS — Y92009 Unspecified place in unspecified non-institutional (private) residence as the place of occurrence of the external cause: Secondary | ICD-10-CM

## 2021-06-18 DIAGNOSIS — I4819 Other persistent atrial fibrillation: Secondary | ICD-10-CM

## 2021-06-18 DIAGNOSIS — Z8249 Family history of ischemic heart disease and other diseases of the circulatory system: Secondary | ICD-10-CM

## 2021-06-18 DIAGNOSIS — I272 Pulmonary hypertension, unspecified: Secondary | ICD-10-CM | POA: Diagnosis present

## 2021-06-18 DIAGNOSIS — N1831 Chronic kidney disease, stage 3a: Secondary | ICD-10-CM

## 2021-06-18 DIAGNOSIS — I639 Cerebral infarction, unspecified: Secondary | ICD-10-CM

## 2021-06-18 DIAGNOSIS — G8191 Hemiplegia, unspecified affecting right dominant side: Secondary | ICD-10-CM | POA: Diagnosis present

## 2021-06-18 DIAGNOSIS — I130 Hypertensive heart and chronic kidney disease with heart failure and stage 1 through stage 4 chronic kidney disease, or unspecified chronic kidney disease: Principal | ICD-10-CM | POA: Diagnosis present

## 2021-06-18 DIAGNOSIS — I10 Essential (primary) hypertension: Secondary | ICD-10-CM

## 2021-06-18 DIAGNOSIS — Z79890 Hormone replacement therapy (postmenopausal): Secondary | ICD-10-CM

## 2021-06-18 DIAGNOSIS — R29898 Other symptoms and signs involving the musculoskeletal system: Secondary | ICD-10-CM | POA: Insufficient documentation

## 2021-06-18 DIAGNOSIS — Z87891 Personal history of nicotine dependence: Secondary | ICD-10-CM

## 2021-06-18 DIAGNOSIS — I509 Heart failure, unspecified: Secondary | ICD-10-CM

## 2021-06-18 DIAGNOSIS — Z79899 Other long term (current) drug therapy: Secondary | ICD-10-CM

## 2021-06-18 DIAGNOSIS — E440 Moderate protein-calorie malnutrition: Secondary | ICD-10-CM | POA: Diagnosis present

## 2021-06-18 DIAGNOSIS — I480 Paroxysmal atrial fibrillation: Secondary | ICD-10-CM | POA: Diagnosis present

## 2021-06-18 DIAGNOSIS — R778 Other specified abnormalities of plasma proteins: Secondary | ICD-10-CM

## 2021-06-18 DIAGNOSIS — J90 Pleural effusion, not elsewhere classified: Secondary | ICD-10-CM

## 2021-06-18 DIAGNOSIS — R531 Weakness: Secondary | ICD-10-CM | POA: Diagnosis not present

## 2021-06-18 DIAGNOSIS — Z7901 Long term (current) use of anticoagulants: Secondary | ICD-10-CM

## 2021-06-18 DIAGNOSIS — N1832 Chronic kidney disease, stage 3b: Secondary | ICD-10-CM | POA: Diagnosis present

## 2021-06-18 DIAGNOSIS — I5032 Chronic diastolic (congestive) heart failure: Secondary | ICD-10-CM | POA: Diagnosis not present

## 2021-06-18 DIAGNOSIS — R29705 NIHSS score 5: Secondary | ICD-10-CM | POA: Diagnosis present

## 2021-06-18 DIAGNOSIS — E785 Hyperlipidemia, unspecified: Secondary | ICD-10-CM | POA: Diagnosis present

## 2021-06-18 DIAGNOSIS — R42 Dizziness and giddiness: Secondary | ICD-10-CM

## 2021-06-18 DIAGNOSIS — J189 Pneumonia, unspecified organism: Secondary | ICD-10-CM | POA: Diagnosis not present

## 2021-06-18 DIAGNOSIS — W19XXXA Unspecified fall, initial encounter: Secondary | ICD-10-CM | POA: Diagnosis not present

## 2021-06-18 DIAGNOSIS — Z66 Do not resuscitate: Secondary | ICD-10-CM | POA: Diagnosis present

## 2021-06-18 DIAGNOSIS — Z20822 Contact with and (suspected) exposure to covid-19: Secondary | ICD-10-CM | POA: Diagnosis present

## 2021-06-18 DIAGNOSIS — I5033 Acute on chronic diastolic (congestive) heart failure: Secondary | ICD-10-CM | POA: Diagnosis present

## 2021-06-18 DIAGNOSIS — E039 Hypothyroidism, unspecified: Secondary | ICD-10-CM | POA: Diagnosis present

## 2021-06-18 DIAGNOSIS — I5030 Unspecified diastolic (congestive) heart failure: Secondary | ICD-10-CM

## 2021-06-18 DIAGNOSIS — J918 Pleural effusion in other conditions classified elsewhere: Secondary | ICD-10-CM | POA: Diagnosis present

## 2021-06-18 DIAGNOSIS — F0390 Unspecified dementia without behavioral disturbance: Secondary | ICD-10-CM | POA: Diagnosis present

## 2021-06-18 DIAGNOSIS — Z6820 Body mass index (BMI) 20.0-20.9, adult: Secondary | ICD-10-CM

## 2021-06-18 DIAGNOSIS — I6389 Other cerebral infarction: Secondary | ICD-10-CM | POA: Diagnosis present

## 2021-06-18 DIAGNOSIS — Z9181 History of falling: Secondary | ICD-10-CM

## 2021-06-18 LAB — URINALYSIS, ROUTINE W REFLEX MICROSCOPIC
Bilirubin Urine: NEGATIVE
Glucose, UA: NEGATIVE mg/dL
Hgb urine dipstick: NEGATIVE
Ketones, ur: NEGATIVE mg/dL
Leukocytes,Ua: NEGATIVE
Nitrite: NEGATIVE
Protein, ur: NEGATIVE mg/dL
Specific Gravity, Urine: 1.013 (ref 1.005–1.030)
pH: 5 (ref 5.0–8.0)

## 2021-06-18 LAB — BASIC METABOLIC PANEL
Anion gap: 12 (ref 5–15)
BUN: 27 mg/dL — ABNORMAL HIGH (ref 8–23)
CO2: 25 mmol/L (ref 22–32)
Calcium: 9.5 mg/dL (ref 8.9–10.3)
Chloride: 103 mmol/L (ref 98–111)
Creatinine, Ser: 1.83 mg/dL — ABNORMAL HIGH (ref 0.61–1.24)
GFR, Estimated: 35 mL/min — ABNORMAL LOW (ref >60)
Glucose, Bld: 96 mg/dL (ref 70–99)
Potassium: 3.9 mmol/L (ref 3.5–5.1)
Sodium: 140 mmol/L (ref 135–145)

## 2021-06-18 LAB — CBC
HCT: 44 % (ref 39.0–52.0)
Hemoglobin: 14.1 g/dL (ref 13.0–17.0)
MCH: 33.1 pg (ref 26.0–34.0)
MCHC: 32 g/dL (ref 30.0–36.0)
MCV: 103.3 fL — ABNORMAL HIGH (ref 80.0–100.0)
Platelets: 299 10*3/uL (ref 150–400)
RBC: 4.26 MIL/uL (ref 4.22–5.81)
RDW: 14.1 % (ref 11.5–15.5)
WBC: 10.6 10*3/uL — ABNORMAL HIGH (ref 4.0–10.5)
nRBC: 0 % (ref 0.0–0.2)

## 2021-06-18 LAB — TROPONIN I (HIGH SENSITIVITY): Troponin I (High Sensitivity): 136 ng/L (ref <18)

## 2021-06-18 LAB — MAGNESIUM: Magnesium: 2.4 mg/dL (ref 1.7–2.4)

## 2021-06-18 LAB — PROCALCITONIN: Procalcitonin: <0.1 ng/mL

## 2021-06-18 MED ORDER — ONDANSETRON HCL 4 MG PO TABS: 4.0000 mg | ORAL_TABLET | Freq: Four times a day (QID) | ORAL | Status: DC | PRN

## 2021-06-18 MED ORDER — SODIUM CHLORIDE 0.9 % IV SOLN: INTRAVENOUS | Status: AC

## 2021-06-18 MED ORDER — SODIUM CHLORIDE 0.9 % IV SOLN
1.0000 g | Freq: Once | INTRAVENOUS | Status: AC
  Administered 2021-06-18: 1 g via INTRAVENOUS
  Filled 2021-06-18: qty 10

## 2021-06-18 MED ORDER — ACETAMINOPHEN 325 MG PO TABS: 650.0000 mg | ORAL_TABLET | Freq: Four times a day (QID) | ORAL | Status: DC | PRN

## 2021-06-18 MED ORDER — VITAMIN B-12 1000 MCG PO TABS
1000.0000 ug | ORAL_TABLET | Freq: Every day | ORAL | Status: DC
  Administered 2021-06-19 – 2021-06-22 (×4): 1000 ug via ORAL
  Filled 2021-06-18 (×4): qty 1

## 2021-06-18 MED ORDER — ATORVASTATIN CALCIUM 10 MG PO TABS
10.0000 mg | ORAL_TABLET | Freq: Every day | ORAL | Status: DC
  Administered 2021-06-19 – 2021-06-22 (×4): 10 mg via ORAL
  Filled 2021-06-18 (×4): qty 1

## 2021-06-18 MED ORDER — LEVOTHYROXINE SODIUM 50 MCG PO TABS
75.0000 ug | ORAL_TABLET | Freq: Every day | ORAL | Status: DC
  Administered 2021-06-19 – 2021-06-22 (×4): 75 ug via ORAL
  Filled 2021-06-18 (×4): qty 1

## 2021-06-18 MED ORDER — SODIUM CHLORIDE 0.9 % IV SOLN: 2.0000 g | INTRAVENOUS | Status: DC

## 2021-06-18 MED ORDER — SODIUM CHLORIDE 0.9 % IV SOLN: 500.0000 mg | INTRAVENOUS | Status: DC

## 2021-06-18 MED ORDER — SODIUM CHLORIDE 0.9 % IV SOLN
1.0000 g | Freq: Once | INTRAVENOUS | Status: AC
  Administered 2021-06-18: 1 g via INTRAVENOUS
  Filled 2021-06-18 (×2): qty 10

## 2021-06-18 MED ORDER — AMIODARONE HCL 200 MG PO TABS
100.0000 mg | ORAL_TABLET | Freq: Every day | ORAL | Status: DC
  Administered 2021-06-19 – 2021-06-22 (×4): 100 mg via ORAL
  Filled 2021-06-18 (×4): qty 1

## 2021-06-18 MED ORDER — APIXABAN 2.5 MG PO TABS
2.5000 mg | ORAL_TABLET | Freq: Two times a day (BID) | ORAL | Status: DC
Start: 2021-06-18 — End: 2021-06-19
  Administered 2021-06-18: 2.5 mg via ORAL
  Filled 2021-06-18 (×2): qty 1

## 2021-06-18 MED ORDER — FUROSEMIDE 40 MG PO TABS
20.0000 mg | ORAL_TABLET | Freq: Every day | ORAL | Status: DC
Start: 2021-06-19 — End: 2021-06-19

## 2021-06-18 MED ORDER — ONDANSETRON HCL 4 MG/2ML IJ SOLN
4.0000 mg | Freq: Four times a day (QID) | INTRAMUSCULAR | Status: DC | PRN
Start: 2021-06-18 — End: 2021-06-22

## 2021-06-18 MED ORDER — LACTATED RINGERS IV BOLUS
1000.0000 mL | Freq: Once | INTRAVENOUS | Status: AC
  Administered 2021-06-18: 1000 mL via INTRAVENOUS

## 2021-06-18 MED ORDER — SODIUM CHLORIDE 0.9 % IV SOLN
500.0000 mg | Freq: Once | INTRAVENOUS | Status: AC
  Administered 2021-06-18: 500 mg via INTRAVENOUS
  Filled 2021-06-18: qty 5

## 2021-06-18 MED ORDER — ACETAMINOPHEN 650 MG RE SUPP: 650.0000 mg | Freq: Four times a day (QID) | RECTAL | Status: DC | PRN

## 2021-06-18 MED ORDER — POTASSIUM CHLORIDE CRYS ER 20 MEQ PO TBCR
20.0000 meq | EXTENDED_RELEASE_TABLET | Freq: Every day | ORAL | Status: DC
  Administered 2021-06-19 – 2021-06-22 (×4): 20 meq via ORAL
  Filled 2021-06-18 (×4): qty 1

## 2021-06-18 NOTE — Assessment & Plan Note (Signed)
Fall precautions ?PT eval ?No evidence of acute injury ?

## 2021-06-18 NOTE — Progress Notes (Signed)
? ?Cardiology Office Note   ? ?Date:  06/18/2021  ? ?ID:  Robert Frye, DOB Jun 17, 1931, MRN [id number] ? ?PCP:  Robert Hire, MD  ?Cardiologist:  Robert Sacramento, MD  ?Electrophysiologist:  None  ? ?Chief Complaint: Right lower extremity weakness and progressive fatigue ? ?History of Present Illness:  ? ?Robert Frye is a 88 y.o. male with history of persistent A-fib status post DCCV in 03/13/2020 and 06/11/2020, HFpEF, pulmonary hypertension, orthostatic hypotension, CKD stage III, mild dementia, HLD, and hypothyroidism who presents for evaluation of right lower extremity weakness and progressive fatigue. ? ?He was admitted in 01/2020 with A-fib with RVR complicated by heart failure.  Troponin was mildly elevated, though felt to be related to demand ischemia.  Echo demonstrated preserved LV systolic function, severe eccentric LVH, severe left atrial enlargement, and mild mitral regurgitation.  Symptoms improved with diuresis and rate control.  He was started on anticoagulation with apixaban.  He underwent successful DCCV in 02/2020.  Lexiscan MPI in 03/2020 showed no evidence of ischemia with EF being mildly reduced with accuracy uncertain.  He had recurrent A-fib that required treatment with amiodarone.  He was admitted in 05/2020 with CHF.  Echo demonstrated an EF of 50 to 55% with severe concentric LVH, moderate size left pleural effusion, and mild mitral regurgitation.  He underwent repeat DCCV.  Metoprolol has previously had to be discontinued secondary to bradycardia.  He was seen in 10/2020 with orthostatic dizziness.  He was mildly bradycardic, though continued on amiodarone given risk of recurrent of A-fib.  He had left-sided thoracentesis at the end of 10/2020 with significant improvement in shortness of breath.  He was last seen in the office in 01/2021 and was without symptoms of angina or decompensation.  He did continue to have positional dizziness. ? ?He contacted our office this morning noting a  stable nonproductive cough and fatigue.  There was also some positional dizziness.  Phone note indicates symptoms were stable and he was at his baseline.  BP was stable at 134/68, heart rate 62 bpm, O2 saturation 94%. ? ?He comes in accompanied by his wife today who provides the history.  She notes over the past week or so the patient has gotten progressively weaker.  There has been a cough that has been occasionally productive of clear sputum.  No fevers or chills.  He has not reported any angina or palpitations.  He has a chronic dizziness that she indicates is largely unchanged.  No presyncope or syncope.  This morning, the patient got up to go to the restroom and stumbled, falling onto the toilet.  He did not fall to the ground, hit his head, or suffer LOC.  No angina, palpitations, or dyspnea.  No lower extremity swelling or orthopnea.  He has been adherent to all medications.  She also notes that she believes one of his legs is weaker than the other, though she cannot recall which. ? ? ?Labs independently reviewed: ?04/2021 - Hgb 13.8, PLT 295, TSH normal, TC 154, TG 68, HDL 51, LDL 89, potassium 4.3, BUN 24, serum creatinine 1.7, albumin 4.0, AST/ALT normal ? ?Past Medical History:  ?Diagnosis Date  ? (HFpEF) heart failure with preserved ejection fraction ([place])   ? CKD (chronic kidney disease), stage III ([place])   ? Dementia ([place])   ? History of stress test   ? a. 03/2020 MV: No ischemia/infarct. EF 40% (55-60% by echo 01/2020).  ? Hyperlipidemia   ? Hypertension   ?  Hypertrophic cardiomyopathy ([place])   ? a. 01/2020 Echo: EF 55-60%, no rwma. Sev eccentric LVH of apical and basal-septal segments. gr1 DD. Mildly enlarged RV w/ nl RV fxn. sev dil LA, mod dil RA. Mild MR.  ? Hypothyroidism   ? Persistent atrial fibrillation ([place])   ? a. Dx 01/2020 s/p DCCV 02/2020; b. 02/2020 Recurrent Afib noted-->rate controlled-->amio started 05/2020 in setting of ongoing CHF; c. CHA2DS2VASc = 4-->Eliquis 2.5 BID.  ? ? ?Past  Surgical History:  ?Procedure Laterality Date  ? CARDIOVERSION N/A 03/14/2020  ? Procedure: CARDIOVERSION;  Surgeon: Wellington Hampshire, MD;  Location: [facility];  Service: Cardiovascular;  Laterality: N/A;  ? CARDIOVERSION N/A 05/31/2020  ? Procedure: CARDIOVERSION;  Surgeon: Kate Sable, MD;  Location: [facility];  Service: Cardiovascular;  Laterality: N/A;  ? CATARACT EXTRACTION W/PHACO Right 03/13/2021  ? Procedure: CATARACT EXTRACTION PHACO AND INTRAOCULAR LENS PLACEMENT (IOC) RIGHT 20.48 01:55.4;  Surgeon: Frye Robson, MD;  Location: [place];  Service: Ophthalmology;  Laterality: Right;  ? CATARACT EXTRACTION W/PHACO Left 04/03/2021  ? Procedure: CATARACT EXTRACTION PHACO AND INTRAOCULAR LENS PLACEMENT (IOC) LEFT 12.07 00:58.5;  Surgeon: Frye Robson, MD;  Location: [place];  Service: Ophthalmology;  Laterality: Left;  ? HERNIA REPAIR    ? ? ?Current Medications: ?Current Meds  ?Medication Sig  ? amiodarone (PACERONE) 100 MG tablet Take 1 tablet (100 mg total) by mouth daily.  ? apixaban (ELIQUIS) 2.5 MG TABS tablet Take 1 tablet (2.5 mg total) by mouth 2 (two) times daily.  ? atorvastatin (LIPITOR) 10 MG tablet Take 10 mg by mouth daily.  ? Cholecalciferol (DIALYVITE VITAMIN D 5000) 125 MCG (5000 UT) capsule Take 2,000 Units by mouth daily.  ? cyanocobalamin 1000 MCG tablet Take 1,000 mcg by mouth daily.  ? furosemide (LASIX) 20 MG tablet Take 1 tablet (20 mg total) by mouth daily.  ? levothyroxine (SYNTHROID) 75 MCG tablet Take 75 mcg by mouth daily before breakfast.  ? Naphazoline HCl (CLEAR EYES OP) Place 1 drop into both eyes daily as needed (dry/itchy eyes).  ? potassium chloride SA (KLOR-CON M20) 20 MEQ tablet Take 1 tablet (20 mEq total) by mouth daily.  ? ? ?Allergies:   Jardiance [empagliflozin]  ? ?Social History  ? ?Socioeconomic History  ? Marital status: Married  ?  Spouse name: Not on file  ? Number of children: Not on file  ? Years of education: Not on file  ?  Highest education level: Not on file  ?Occupational History  ? Not on file  ?Tobacco Use  ? Smoking status: Former  ?  Types: Cigarettes  ? Smokeless tobacco: Never  ? Tobacco comments:  ?  Smoked as a teenager  ?Vaping Use  ? Vaping Use: Never used  ?Substance and Sexual Activity  ? Alcohol use: Not Currently  ? Drug use: Never  ? Sexual activity: Not on file  ?Other Topics Concern  ? Not on file  ?Social History Narrative  ? Not on file  ? ?Social Determinants of Health  ? ?Financial Resource Strain: Not on file  ?Food Insecurity: Not on file  ?Transportation Needs: Not on file  ?Physical Activity: Not on file  ?Stress: Not on file  ?Social Connections: Not on file  ?  ? ?Family History:  ?The patient's family history includes Heart attack in his father; Heart disease in his brother and sister. ? ?ROS:   ?Unable to complete review systems secondary to underlying dementia. ? ? ?EKGs/Labs/Other Studies Reviewed:   ? ?  Studies reviewed were summarized above. The additional studies were reviewed today: ? ?Limited echo 05/29/2020: ?1. Left ventricular ejection fraction, by estimation, is 50 to 55%. The  ?left ventricle has low normal function. Left ventricular endocardial  ?border not optimally defined to evaluate regional wall motion. There is  ?severe concentric left ventricular  ?hypertrophy. Left ventricular diastolic parameters are indeterminate.  ?Small cavity size.  ? 2. Right ventricular systolic function is normal. The right ventricular  ?size is normal. Tricuspid regurgitation signal is inadequate for assessing  ?PA pressure.  ? 3. Moderate pleural effusion in the left lateral region.  ? 4. The mitral valve is normal in structure. Mild mitral valve  ?regurgitation. No evidence of mitral stenosis.  ? 5. The aortic valve is normal in structure. Aortic valve regurgitation is  ?not visualized. No aortic stenosis is present.  ? 6. The inferior vena cava is dilated in size with <50% respiratory  ?variability,  suggesting right atrial pressure of 15 mmHg. ?__________ ? ?Lexiscan MPI 04/12/2020: ?This is an intermediate risk study due to moderately reduced EF ?The left ventricular ejection fraction is moderately decreased (40

## 2021-06-18 NOTE — Assessment & Plan Note (Signed)
Creatinine 1.83 slightly above baseline ?IV hydration ?

## 2021-06-18 NOTE — Assessment & Plan Note (Signed)
Continue Eliquis ?No evidence of bleeding from fall ?

## 2021-06-18 NOTE — ED Provider Notes (Signed)
? ?[facility] ?Provider Note ? ? ? Event Date/Time  ? First MD Initiated Contact with Patient 06/18/21 1841   ?  (approximate) ? ? ?History  ? ?Chief Complaint ?Weakness ? ? ?HPI ? ?ROCIO Frye is a 88 y.o. male with past medical history of hypertension, hyperlipidemia, CKD, CHF, atrial fibrillation on Eliquis, and dementia who presents to the ED complaining of weakness.  Majority of history is obtained from patient's wife and son at bedside.  They state that he was doing well yesterday and is typically able to get around with a walker without difficulty.  Wife states that earlier this morning patient had attempted to go to the bathroom and she heard him slip and fall while attempting to get onto his bed commode.  Patient denied hitting his head or losing consciousness, denies any injuries related to the fall.  Wife states that after the fall he seemed to have difficulty moving his left leg and seemed much weaker than usual.  Later in the day, she also noticed that his right leg seemed to be weaker and he was unable to walk at all.  She states he has not been any more confused than usual, is at his baseline mental status.  He was initially brought to his cardiologist office, was subsequently sent to outpatient CT of his head that was unremarkable.  Family subsequently brought the patient to the ED for further evaluation after he continued to be unable to walk. ?  ? ? ?Physical Exam  ? ?Triage Vital Signs: ?ED Triage Vitals  ?Enc Vitals Group  ?   BP 06/18/21 1710 135/71  ?   Pulse Rate 06/18/21 1710 61  ?   Resp 06/18/21 1710 18  ?   Temp 06/18/21 1710 97.8 ?F (36.6 ?C)  ?   Temp Source 06/18/21 1710 Oral  ?   SpO2 06/18/21 1710 98 %  ?   Weight --   ?   Height --   ?   Head Circumference --   ?   Peak Flow --   ?   Pain Score 06/18/21 1707 0  ?   Pain Loc --   ?   Pain Edu? --   ?   Excl. in GC? --   ? ? ?Most recent vital signs: ?Vitals:  ? 06/18/21 1710  ?BP: 135/71  ?Pulse: 61  ?Resp:  18  ?Temp: 97.8 ?F (36.6 ?C)  ?SpO2: 98%  ? ? ?Constitutional: Alert and oriented to person and place, but not time or situation. ?Eyes: Conjunctivae are normal.  Pupils equal, round, and reactive to light bilaterally. ?Head: Atraumatic. ?Nose: No congestion/rhinnorhea. ?Mouth/Throat: Mucous membranes are moist.  ?Neck: No midline cervical spine tenderness to palpation. ?Cardiovascular: Normal rate, regular rhythm. Grossly normal heart sounds.  2+ radial pulses bilaterally. ?Respiratory: Normal respiratory effort.  No retractions. Lungs CTAB. ?Gastrointestinal: Soft and nontender. No distention. ?Musculoskeletal: No lower extremity tenderness nor edema.  Skin tears to bilateral forearms with no bony tenderness. ?Neurologic:  Normal speech and language.  4 out of 5 strength in bilateral lower extremities, 5 out of 5 strength in bilateral upper extremities. ? ? ? ?ED Results / Procedures / Treatments  ? ?Labs ?(all labs ordered are listed, but only abnormal results are displayed) ?Labs Reviewed  ?BASIC METABOLIC PANEL - Abnormal; Notable for the following components:  ?    Result Value  ? BUN 27 (*)   ? Creatinine, Ser 1.83 (*)   ? GFR,  Estimated 35 (*)   ? All other components within normal limits  ?CBC - Abnormal; Notable for the following components:  ? WBC 10.6 (*)   ? MCV 103.3 (*)   ? All other components within normal limits  ?URINALYSIS, ROUTINE W REFLEX MICROSCOPIC - Abnormal; Notable for the following components:  ? Color, Urine YELLOW (*)   ? APPearance CLEAR (*)   ? All other components within normal limits  ?TROPONIN I (HIGH SENSITIVITY) - Abnormal; Notable for the following components:  ? Troponin I (High Sensitivity) 136 (*)   ? All other components within normal limits  ?MAGNESIUM  ?PROCALCITONIN  ?CBG MONITORING, ED  ? ? ? ?EKG ? ?ED ECG REPORT ?Harriet Masson, the attending physician, personally viewed and interpreted this ECG. ? ? Date: 06/18/2021 ? EKG Time: 17:10 ? Rate: 61 ? Rhythm: normal  sinus rhythm ? Axis: LAD ? Intervals:first-degree A-V block  ? ST&T Change: None ? ?RADIOLOGY ?Chest x-ray reviewed by me with moderately sized left pleural effusion, no obvious infiltrate noted. ? ?PROCEDURES: ? ?Critical Care performed: No ? ?Procedures ? ? ?MEDICATIONS ORDERED IN ED: ?Medications  ?cefTRIAXone (ROCEPHIN) 1 g in sodium chloride 0.9 % 100 mL IVPB (1 g Intravenous New Bag/Given 06/18/21 2033)  ?azithromycin (ZITHROMAX) 500 mg in sodium chloride 0.9 % 250 mL IVPB (has no administration in time range)  ?lactated ringers bolus 1,000 mL (1,000 mLs Intravenous New Bag/Given 06/18/21 1951)  ? ? ? ?IMPRESSION / MDM / ASSESSMENT AND PLAN / ED COURSE  ?I reviewed the triage vital signs and the nursing notes. ?             ?               ? ?88 y.o. male with past medical history of hypertension, hyperlipidemia, CKD, CHF, atrial fibrillation on Eliquis, and dementia who presents to the ED complaining of fall and acute onset of lower extremity weakness earlier today. ? ?Differential diagnosis includes, but is not limited to, stroke, intracranial process, cervical spine injury, pneumonia, UTI, dehydration, electrolyte abnormality, ACS. ? ?Patient is nontoxic-appearing and in no acute distress, vital signs are reassuring and he is at his baseline mental status.  He does appear weak in his bilateral lower extremities, however this is equal bilaterally.  Given acute onset, I would consider stroke, however outpatient CT of his head was negative for acute process.  We will check CT of his cervical spine given fall, screen for infectious process with chest x-ray and UA.  Labs thus far are reassuring and show stable chronic kidney disease on BMP, CBC with mild leukocytosis and no anemia. ? ?Troponin noted to be elevated, however this is similar to his previous baseline and patient continues to deny any chest pain or shortness of breath.  Chest x-ray does show a moderately sized left pleural effusion and with patient's  recent cough, this is concerning for pneumonia.  We will treat with Rocephin and azithromycin, given his significant weakness, case discussed with hospitalist for admission.  CT of the cervical spine is negative for acute injury, does redemonstrate pleural effusion. ? ?  ? ? ?FINAL CLINICAL IMPRESSION(S) / ED DIAGNOSES  ? ?Final diagnoses:  ?Weakness of both lower extremities  ?Community acquired pneumonia of left lower lobe of lung  ?Elevated troponin  ? ? ? ?Rx / DC Orders  ? ?ED Discharge Orders   ? ? None  ? ?  ? ? ? ?Note:  This document was prepared using  [employer] engineervoice recognition software and may include unintentional dictation errors. ?  ?Chesley NoonJessup, Azzam Mehra, MD ?06/18/21 2048 ? ?

## 2021-06-18 NOTE — Telephone Encounter (Signed)
Patient spouse calling ?Wants to follow up with information that was discussed in visit today  ?Please call to discuss ?

## 2021-06-18 NOTE — ED Notes (Signed)
Pt to radiology via stretcher at this time.  

## 2021-06-18 NOTE — ED Triage Notes (Signed)
Pt via POV from home. Pt c/o bilateral leg weakness that started this AM, wife believes it is from dehydration. Denies any pain. States that he did fall this AM from standing onto the toilet. Denies head injury. Pt is on Eliquis. Pt is A&OX4 and NAD  ?

## 2021-06-18 NOTE — Assessment & Plan Note (Signed)
Continue Lasix 

## 2021-06-18 NOTE — Telephone Encounter (Addendum)
Spoke with the patient wife. ?Adv her that the patients CT of the head today was negative for acute cva. ?Eula Listen, PA is awaiting lab results before giving further recommendation. ? ?Pt wife sts that the patient weakness seems to being progressing. He is now unable to bear weight on either side. ?Her grandson is at the home and had to assist to get the pt in the house. ? ?Adv the pt  wife that I would recommend that the patient be taken to the ER for evaluation. Pt wife declined ER recommendation earlier. ?Pt wife would like Ryan updated. Adv her that I will update Ryan and call back with his recommendation. ? ?Spoke with Alycia Rossetti and he is agreeable with the ER recommendation. He suspects this is not likely cardiac in nature. ? ?Called the patients wife back and made her aware. She is agreeable now with the ER recommendation and will transport the patient by car to [facility]. ?

## 2021-06-18 NOTE — ED Notes (Signed)
Per MD Damita Dunnings, reschedule rocephin and zithromax to Q 24 from last dose given ?

## 2021-06-18 NOTE — Assessment & Plan Note (Signed)
Delirium precautions 

## 2021-06-18 NOTE — Assessment & Plan Note (Signed)
Has had DC cardioversion twice in the past ?Continue amiodarone and apixaban ?Continuous cardiac monitoring  ?

## 2021-06-18 NOTE — Assessment & Plan Note (Addendum)
Suspect related to CJF. BNP was elevated above 1000 but wife states no shortness of breath beyond baseline ?History of thoracentesis in October 2022 ?Can consider IR consult for thoracentesis ?Continue lasix ?

## 2021-06-18 NOTE — H&P (Addendum)
?History and Physical  ? ? ?Patient: Robert Frye [id number] DOB: October 28, 1931 ?DOA: 06/18/2021 ?DOS: the patient was seen and examined on 06/18/2021 ?PCP: Baxter Hire, MD  ?Patient coming from: Home ? ?Chief Complaint:  ?Chief Complaint  ?Patient presents with  ? Weakness  ? ? ?HPI: Robert Frye is a 88 y.o. male with medical history significant for A-fib on Eliquis s/p cardioversion 03/13/2020 and 06/11/2020, HFpEF (EF 50 to 55% 05/2020), chronically elevated troponin with intermediate risk Lexiscan 03/2020, CKD 3B, dementia, hypothyroidism, HTN, chronic dyspnea with history of pleural effusion s/p thoracentesis 10/2020 who presents to the ED with a 1 week history of nonproductive cough, dizziness and progressive weakness, resulting in a fall on the morning of arrival.  Patient apparently went to use the bathroom and stumbled falling in a sitting position onto the commode. He did not get hurt. Wife who gives most of the history denies noticing slurred speech or facial droop or change in mentation. She did say his legs appeared weak. Patient saw his cardiologist on the morning of arrival and they were referred to the emergency room for further evaluation. ?ED course: Vitals within normal limits on arrival.  Blood work significant for troponin of 136 (baseline in the low 100s), creatinine 1.83 which is around his baseline, WBC 10.6 with hemoglobin 14.1.  Urinalysis unremarkable.  Procalcitonin, COVID and flu pending.  EKG, personally viewed and interpreted: Sinus at 61 with nonspecific ST-T wave changes.  C-spine CT nonacute but did show large left pleural effusion.  Chest x-ray also shows moderate left pleural effusion. ?Patient started on Rocephin and azithromycin.  Hospitalist consulted for admission.  ? ?Review of Systems: As mentioned in the history of present illness. All other systems reviewed and are negative. ?Past Medical History:  ?Diagnosis Date  ? (HFpEF) heart failure with preserved ejection  fraction ([place])   ? CKD (chronic kidney disease), stage III ([place])   ? Dementia ([place])   ? History of stress test   ? a. 03/2020 MV: No ischemia/infarct. EF 40% (55-60% by echo 01/2020).  ? Hyperlipidemia   ? Hypertension   ? Hypertrophic cardiomyopathy ([place])   ? a. 01/2020 Echo: EF 55-60%, no rwma. Sev eccentric LVH of apical and basal-septal segments. gr1 DD. Mildly enlarged RV w/ nl RV fxn. sev dil LA, mod dil RA. Mild MR.  ? Hypothyroidism   ? Persistent atrial fibrillation ([place])   ? a. Dx 01/2020 s/p DCCV 02/2020; b. 02/2020 Recurrent Afib noted-->rate controlled-->amio started 05/2020 in setting of ongoing CHF; c. CHA2DS2VASc = 4-->Eliquis 2.5 BID.  ? ?Past Surgical History:  ?Procedure Laterality Date  ? CARDIOVERSION N/A 03/14/2020  ? Procedure: CARDIOVERSION;  Surgeon: Wellington Hampshire, MD;  Location: [facility];  Service: Cardiovascular;  Laterality: N/A;  ? CARDIOVERSION N/A 05/31/2020  ? Procedure: CARDIOVERSION;  Surgeon: Kate Sable, MD;  Location: [facility];  Service: Cardiovascular;  Laterality: N/A;  ? CATARACT EXTRACTION W/PHACO Right 03/13/2021  ? Procedure: CATARACT EXTRACTION PHACO AND INTRAOCULAR LENS PLACEMENT (IOC) RIGHT 20.48 01:55.4;  Surgeon: Birder Robson, MD;  Location: [facility];  Service: Ophthalmology;  Laterality: Right;  ? CATARACT EXTRACTION W/PHACO Left 04/03/2021  ? Procedure: CATARACT EXTRACTION PHACO AND INTRAOCULAR LENS PLACEMENT (IOC) LEFT 12.07 00:58.5;  Surgeon: Birder Robson, MD;  Location: [facility];  Service: Ophthalmology;  Laterality: Left;  ? HERNIA REPAIR    ? ?Social History:  reports that he has quit smoking. His smoking use included cigarettes. He has never used smokeless  tobacco. He reports that he does not currently use alcohol. He reports that he does not use drugs. ? ?Allergies  ?Allergen Reactions  ? Jardiance [Empagliflozin] Other (See Comments)  ?  Dizziness/ weakness  ? ? ?Family History  ?Problem Relation Age of Onset  ? Heart  attack Father   ? Heart disease Sister   ? Heart disease Brother   ? ? ?Prior to Admission medications   ?Medication Sig Start Date End Date Taking? Authorizing Provider  ?amiodarone (PACERONE) 100 MG tablet Take 1 tablet (100 mg total) by mouth daily. 08/31/20 08/26/21 Yes Visser, Jacquelyn D, PA-C  ?apixaban (ELIQUIS) 2.5 MG TABS tablet Take 1 tablet (2.5 mg total) by mouth 2 (two) times daily. 02/09/21  Yes Wellington Hampshire, MD  ?atorvastatin (LIPITOR) 10 MG tablet Take 10 mg by mouth daily. 11/17/14  Yes [provider]  ?Cholecalciferol (DIALYVITE VITAMIN D 5000) 125 MCG (5000 UT) capsule Take 2,000 Units by mouth daily.   Yes [provider]  ?cyanocobalamin 1000 MCG tablet Take 1,000 mcg by mouth daily.   Yes [provider]  ?furosemide (LASIX) 20 MG tablet Take 1 tablet (20 mg total) by mouth daily. 05/29/21 07/28/21 Yes Wellington Hampshire, MD  ?levothyroxine (SYNTHROID) 75 MCG tablet Take 75 mcg by mouth daily before breakfast. 11/12/14  Yes [provider]  ?Naphazoline HCl (CLEAR EYES OP) Place 1 drop into both eyes daily as needed (dry/itchy eyes).   Yes [provider]  ?potassium chloride SA (KLOR-CON M20) 20 MEQ tablet Take 1 tablet (20 mEq total) by mouth daily. 08/31/20 08/26/21 Yes Marrianne Mood D, PA-C  ? ? ?Physical Exam: ?Vitals:  ? 06/18/21 1710  ?BP: 135/71  ?Pulse: 61  ?Resp: 18  ?Temp: 97.8 ?F (36.6 ?C)  ?TempSrc: Oral  ?SpO2: 98%  ? ?Physical Exam ?Vitals and nursing note reviewed.  ?Constitutional:   ?   General: He is not in acute distress. ?HENT:  ?   Head: Normocephalic and atraumatic.  ?Cardiovascular:  ?   Rate and Rhythm: Normal rate and regular rhythm.  ?   Pulses: Normal pulses.  ?   Heart sounds: Normal heart sounds.  ?Pulmonary:  ?   Effort: Pulmonary effort is normal.  ?   Breath sounds: Examination of the left-lower field reveals decreased breath sounds. Decreased breath sounds present.  ?Abdominal:  ?   Palpations: Abdomen is soft.  ?    Tenderness: There is no abdominal tenderness.  ?Neurological:  ?   Mental Status: Mental status is at baseline.  ? ? ? ?Data Reviewed: ?Relevant notes from primary care and specialist visits, past discharge summaries as available in EHR, including Care Everywhere. ?Prior diagnostic testing as pertinent to current admission diagnoses ?Updated medications and problem lists for reconciliation ?ED course, including vitals, labs, imaging, treatment and response to treatment ?Triage notes, nursing and pharmacy notes and ED provider's notes ?Notable results as noted in HPI ? ? ?Assessment and Plan: ?* CAP (community acquired pneumonia) ?Patient symptomatic for cough and has a effusion ?Rocephin and azithromycin and antitussives ?Procalcitonin pending ? ?Weakness ?Multifactorial and secondary to possible respiratory tract illness, mild dehydration, fall and general physical deconditioning ?CT scan with no evidence of acute injury.  Fall appears to be low impact onto the commode ?No evidence of neurologic deficit at this time ?Neurologic checks ?PT eval ?Treat acute conditions ? ? ?Recurrent pleural effusion on left ?Suspect related to CJF. BNP was elevated above 1000 but wife states no shortness of  breath beyond baseline ?History of thoracentesis in October 2022 ?Can consider IR consult for thoracentesis ?Continue lasix ? ?Fall at home, initial encounter ?Fall precautions ?PT eval ?No evidence of acute injury ? ?Elevated troponin ?Troponin 145 but baseline appears to be in the low 100s ?Had [facility] January 2022 with intermediate risk ?Continue to trend troponins ?Saw primary  cardiologist on a.m. of 3/27.   ?Can consider inpatient  Cardiology consult ? ? ? ?Chronic anticoagulation ?Continue Eliquis ?No evidence of bleeding from fall ? ?Paroxysmal atrial fibrillation (HCC) ?Has had DC cardioversion twice in the past ?Continue amiodarone and apixaban ?Continuous cardiac monitoring  ? ?(HFpEF) heart failure with preserved  ejection fraction ([place]) ?Continue Lasix ? ?Hypothyroidism ?Continue Synthroid ? ?Dementia without behavioral disturbance ([place]) ?Delirium precautions ? ?Stage 3b chronic kidney disease ([place]) ?Creatinine 1.83 slightly

## 2021-06-18 NOTE — ED Notes (Signed)
Pt NAD in bed, a/ox3 per baseline. Spouse states pt normally gets around without issue with walker but today was markedly weaker and fell onto his cammode and could not walk after this. Pt has equal bilat strength to all extremities. LS clear. Denies complaints at this time. ?

## 2021-06-18 NOTE — Assessment & Plan Note (Signed)
Patient symptomatic for cough and has a effusion ?Rocephin and azithromycin and antitussives ?Procalcitonin pending ?

## 2021-06-18 NOTE — Telephone Encounter (Signed)
Attempted to call the patient. ?No answer- I left a detailed message of results on the home # (ok per DPR). ?  ?I asked that he call back with any further questions/ concerns. ?

## 2021-06-18 NOTE — Telephone Encounter (Signed)
STAT if patient feels like he/she is going to faint  ? ?Are you dizzy now? Yes per wife  ? ?Do you feel faint or have you passed out? no ? ?Do you have any other symptoms? Fatigue hacking cough  ? ?Have you checked your HR and BP (record if available)? No but will take while waiting on call back  ? ?Patient wife offered same day virtual but declined due to [facility]. Unable to make it to office in time for opening .  ? ? ?

## 2021-06-18 NOTE — Patient Instructions (Addendum)
Medication Instructions:  ?Your physician recommends that you continue on your current medications as directed. Please refer to the Current Medication list given to you today. ? ?*If you need a refill on your cardiac medications before your next appointment, please call your pharmacy* ? ? ?Lab Work: ?Cbc and Bmp today.  ?If you have labs (blood work) drawn today and your tests are completely normal, you will receive your results only by: ?MyChart Message (if you have MyChart) OR ?A paper copy in the mail ?If you have any lab test that is abnormal or we need to change your treatment, we will call you to review the results. ? ? ?Testing/Procedures: ?Non-Cardiac CT scanning, (CAT scanning), is a noninvasive, special x-ray that produces cross-sectional images of the body using x-rays and a computer. CT scans help physicians diagnose and treat medical conditions. For some CT exams, a contrast material is used to enhance visibility in the area of the body being studied. CT scans provide greater clarity and reveal more details than regular x-ray exams.  ? ?Please head over to the [facility]  ?[address] ?[address] ?[place], [place] ?[phone] ? ? ?Follow-Up: ?At [facility], you and your health needs are our priority.  As part of our continuing mission to provide you with exceptional heart care, we have created designated Provider Care Teams.  These Care Teams include your primary Cardiologist (physician) and Advanced Practice Providers (APPs -  Physician Assistants and Nurse Practitioners) who all work together to provide you with the care you need, when you need it. ? ?We recommend signing up for the patient portal called "MyChart".  Sign up information is provided on this After Visit Summary.  MyChart is used to connect with patients for Virtual Visits (Telemedicine).  Patients are able to view lab/test results, encounter notes, upcoming appointments, etc.  Non-urgent messages can be sent to  your provider as well.   ?To learn more about what you can do with MyChart, go to [email or web address].   ? ?Your next appointment:   ?3 month(s) ? ?Please follow up with Dr. Letitia Libra before your next appointment with Cardiology ? ?The format for your next appointment:   ?In Person ? ?Provider:   ?You may see [name], MD or one of the following Advanced Practice Providers on your designated Care Team:   ?Nicolasa Ducking, NP ?Eula Listen, PA-C ?Cadence Fransico Michael, PA-C{  ? ? ?Other Instructions ?N/A ? ?

## 2021-06-18 NOTE — Assessment & Plan Note (Signed)
Continue Synthroid °

## 2021-06-18 NOTE — Assessment & Plan Note (Addendum)
Troponin 145 but baseline appears to be in the low 100s ?Had [facility] January 2022 with intermediate risk ?Continue to trend troponins ?Saw primary  cardiologist on a.m. of 3/27.   ?Can consider inpatient  Cardiology consult ? ? ?

## 2021-06-18 NOTE — Assessment & Plan Note (Addendum)
Initial CT scan with no evidence of acute injury.  Subsequent MRI brain found acute stroke. ?Plan: ?--PT/OT ? ?

## 2021-06-18 NOTE — Telephone Encounter (Addendum)
Spoke with the patients wife Robert Frye. ?Pt wife did have a chance to check the patients VS just prior to the call. ? ?Pt wife reports 134/68 62 bpm  O2 sat 94%. ? ?Pt is currently doing ok. He does normally have dizziness with position changes. When he gets up from bed to ambulate to the bathroom he will normally sit on the side of the bed for 1-2 minutes before standing. ?Pt did not do that this morning and by the time he made to the bathroom he became dizzy but as able to make it to the commode. ? ?Patients cough is stable and non-productive. Pt wife reports fatigue but based on her report his activity level seems to be at his baseline. ? ?Adv the patient to continue to monitor the patient. Patient should keep his upcoming appt with Dr. Kirke Corin on May. ?Adv the pt wife to contact the office sooner if needed. ?Patients wife verbalized understanding and voiced appreciation for the call. ? ? ?

## 2021-06-18 NOTE — Telephone Encounter (Signed)
Sondra Barges, PA-C  ?06/18/2021  3:43 PM EDT   ?  ?Please inform the patient head CT showed no acute process.  I recommend he follow-up with his PCP for further evaluation of progressive weakness.  ? ?

## 2021-06-19 ENCOUNTER — Observation Stay: Payer: Medicare Other

## 2021-06-19 ENCOUNTER — Inpatient Hospital Stay: Payer: Medicare Other

## 2021-06-19 DIAGNOSIS — E039 Hypothyroidism, unspecified: Secondary | ICD-10-CM | POA: Diagnosis present

## 2021-06-19 DIAGNOSIS — I5033 Acute on chronic diastolic (congestive) heart failure: Secondary | ICD-10-CM | POA: Diagnosis present

## 2021-06-19 DIAGNOSIS — R531 Weakness: Secondary | ICD-10-CM | POA: Diagnosis present

## 2021-06-19 DIAGNOSIS — I639 Cerebral infarction, unspecified: Secondary | ICD-10-CM | POA: Diagnosis not present

## 2021-06-19 DIAGNOSIS — I480 Paroxysmal atrial fibrillation: Secondary | ICD-10-CM | POA: Diagnosis present

## 2021-06-19 DIAGNOSIS — E785 Hyperlipidemia, unspecified: Secondary | ICD-10-CM | POA: Diagnosis present

## 2021-06-19 DIAGNOSIS — Z79890 Hormone replacement therapy (postmenopausal): Secondary | ICD-10-CM | POA: Diagnosis not present

## 2021-06-19 DIAGNOSIS — Z6820 Body mass index (BMI) 20.0-20.9, adult: Secondary | ICD-10-CM | POA: Diagnosis not present

## 2021-06-19 DIAGNOSIS — R29705 NIHSS score 5: Secondary | ICD-10-CM | POA: Diagnosis present

## 2021-06-19 DIAGNOSIS — E440 Moderate protein-calorie malnutrition: Secondary | ICD-10-CM | POA: Diagnosis present

## 2021-06-19 DIAGNOSIS — G8191 Hemiplegia, unspecified affecting right dominant side: Secondary | ICD-10-CM | POA: Diagnosis present

## 2021-06-19 DIAGNOSIS — F0390 Unspecified dementia without behavioral disturbance: Secondary | ICD-10-CM | POA: Diagnosis present

## 2021-06-19 DIAGNOSIS — I509 Heart failure, unspecified: Secondary | ICD-10-CM

## 2021-06-19 DIAGNOSIS — Z7901 Long term (current) use of anticoagulants: Secondary | ICD-10-CM | POA: Diagnosis not present

## 2021-06-19 DIAGNOSIS — Z9181 History of falling: Secondary | ICD-10-CM | POA: Diagnosis not present

## 2021-06-19 DIAGNOSIS — I130 Hypertensive heart and chronic kidney disease with heart failure and stage 1 through stage 4 chronic kidney disease, or unspecified chronic kidney disease: Secondary | ICD-10-CM | POA: Diagnosis present

## 2021-06-19 DIAGNOSIS — Z20822 Contact with and (suspected) exposure to covid-19: Secondary | ICD-10-CM | POA: Diagnosis present

## 2021-06-19 DIAGNOSIS — J918 Pleural effusion in other conditions classified elsewhere: Secondary | ICD-10-CM | POA: Diagnosis present

## 2021-06-19 DIAGNOSIS — Z66 Do not resuscitate: Secondary | ICD-10-CM | POA: Diagnosis present

## 2021-06-19 DIAGNOSIS — Z79899 Other long term (current) drug therapy: Secondary | ICD-10-CM | POA: Diagnosis not present

## 2021-06-19 DIAGNOSIS — Z87891 Personal history of nicotine dependence: Secondary | ICD-10-CM | POA: Diagnosis not present

## 2021-06-19 DIAGNOSIS — I272 Pulmonary hypertension, unspecified: Secondary | ICD-10-CM | POA: Diagnosis present

## 2021-06-19 DIAGNOSIS — I6389 Other cerebral infarction: Secondary | ICD-10-CM | POA: Diagnosis present

## 2021-06-19 DIAGNOSIS — N1832 Chronic kidney disease, stage 3b: Secondary | ICD-10-CM | POA: Diagnosis present

## 2021-06-19 DIAGNOSIS — Z8249 Family history of ischemic heart disease and other diseases of the circulatory system: Secondary | ICD-10-CM | POA: Diagnosis not present

## 2021-06-19 LAB — BASIC METABOLIC PANEL
Anion gap: 8 (ref 5–15)
BUN/Creatinine Ratio: 14 (ref 10–24)
BUN: 26 mg/dL (ref 8–27)
BUN: 24 mg/dL — ABNORMAL HIGH (ref 8–23)
CO2: 22 mmol/L (ref 20–29)
CO2: 25 mmol/L (ref 22–32)
Calcium: 9.9 mg/dL (ref 8.6–10.2)
Calcium: 8.8 mg/dL — ABNORMAL LOW (ref 8.9–10.3)
Chloride: 104 mmol/L (ref 96–106)
Chloride: 108 mmol/L (ref 98–111)
Creatinine, Ser: 1.85 mg/dL — ABNORMAL HIGH (ref 0.76–1.27)
Creatinine, Ser: 1.62 mg/dL — ABNORMAL HIGH (ref 0.61–1.24)
GFR, Estimated: 41 mL/min — ABNORMAL LOW (ref >60)
Glucose, Bld: 97 mg/dL (ref 70–99)
Glucose: 139 mg/dL — ABNORMAL HIGH (ref 70–99)
Potassium: 4.1 mmol/L (ref 3.5–5.2)
Potassium: 3.8 mmol/L (ref 3.5–5.1)
Sodium: 145 mmol/L — ABNORMAL HIGH (ref 134–144)
Sodium: 141 mmol/L (ref 135–145)
eGFR: 35 mL/min/{1.73_m2} — ABNORMAL LOW (ref >59)

## 2021-06-19 LAB — CBC WITH DIFFERENTIAL/PLATELET
Basophils Absolute: 0.1 10*3/uL (ref 0.0–0.2)
Basos: 1 %
EOS (ABSOLUTE): 0.2 10*3/uL (ref 0.0–0.4)
Eos: 2 %
Hematocrit: 40.9 % (ref 37.5–51.0)
Hemoglobin: 14.1 g/dL (ref 13.0–17.7)
Immature Grans (Abs): 0.1 10*3/uL (ref 0.0–0.1)
Immature Granulocytes: 1 %
Lymphocytes Absolute: 0.7 10*3/uL (ref 0.7–3.1)
Lymphs: 7 %
MCH: 33.9 pg — ABNORMAL HIGH (ref 26.6–33.0)
MCHC: 34.5 g/dL (ref 31.5–35.7)
MCV: 98 fL — ABNORMAL HIGH (ref 79–97)
Monocytes Absolute: 0.8 10*3/uL (ref 0.1–0.9)
Monocytes: 8 %
Neutrophils Absolute: 8.7 10*3/uL — ABNORMAL HIGH (ref 1.4–7.0)
Neutrophils: 81 %
Platelets: 296 10*3/uL (ref 150–450)
RBC: 4.16 x10E6/uL (ref 4.14–5.80)
RDW: 12.5 % (ref 11.6–15.4)
WBC: 10.6 10*3/uL (ref 3.4–10.8)

## 2021-06-19 LAB — BODY FLUID CELL COUNT WITH DIFFERENTIAL
Eos, Fluid: 0 %
Lymphs, Fluid: 75 %
Monocyte-Macrophage-Serous Fluid: 12 %
Neutrophil Count, Fluid: 13 %
Total Nucleated Cell Count, Fluid: 496 cu mm

## 2021-06-19 LAB — CBC
HCT: 39.1 % (ref 39.0–52.0)
Hemoglobin: 12.7 g/dL — ABNORMAL LOW (ref 13.0–17.0)
MCH: 33.2 pg (ref 26.0–34.0)
MCHC: 32.5 g/dL (ref 30.0–36.0)
MCV: 102.1 fL — ABNORMAL HIGH (ref 80.0–100.0)
Platelets: 261 10*3/uL (ref 150–400)
RBC: 3.83 MIL/uL — ABNORMAL LOW (ref 4.22–5.81)
RDW: 13.9 % (ref 11.5–15.5)
WBC: 11.1 10*3/uL — ABNORMAL HIGH (ref 4.0–10.5)
nRBC: 0 % (ref 0.0–0.2)

## 2021-06-19 LAB — PATHOLOGIST SMEAR REVIEW

## 2021-06-19 LAB — STREP PNEUMONIAE URINARY ANTIGEN: Strep Pneumo Urinary Antigen: NEGATIVE

## 2021-06-19 LAB — TROPONIN I (HIGH SENSITIVITY): Troponin I (High Sensitivity): 156 ng/L (ref <18)

## 2021-06-19 LAB — BRAIN NATRIURETIC PEPTIDE: B Natriuretic Peptide: 1384.5 pg/mL — ABNORMAL HIGH (ref 0.0–100.0)

## 2021-06-19 MED ORDER — FUROSEMIDE 10 MG/ML IJ SOLN
40.0000 mg | Freq: Once | INTRAMUSCULAR | Status: AC
  Administered 2021-06-19: 40 mg via INTRAVENOUS
  Filled 2021-06-19: qty 4

## 2021-06-19 NOTE — ED Notes (Signed)
PT at bedside assessing pt at this time.  ?

## 2021-06-19 NOTE — Procedures (Signed)
PROCEDURE SUMMARY: ? ?Successful US guided left thoracentesis. ?Yielded 1 liter of clear yellow fluid. ?Pt tolerated procedure well. ?No immediate complications. ? ?Specimen was sent for labs. ?CXR ordered. ? ?EBL < 1 mL ? ?Pattricia Boss D PA-C ?06/19/2021 ?10:18 AM ? ? ? ?

## 2021-06-19 NOTE — ED Notes (Signed)
Pt transported to US at this time. 

## 2021-06-19 NOTE — Evaluation (Signed)
Physical Therapy Evaluation ?Patient Details ?Name: Robert Frye ?MRN: [id number] ?DOB: 1932-01-16 ?Today's Date: 06/19/2021 ? ?History of Present Illness ? 88 y.o. male with medical history significant for A-fib on Eliquis s/p cardioversion 03/13/2020 and 06/11/2020, HFpEF (EF 50 to 55% 05/2020), chronically elevated troponin with intermediate risk Lexiscan 03/2020, CKD 3B, dementia, hypothyroidism, HTN, chronic dyspnea with history of pleural effusion s/p thoracentesis 10/2020 who presents to the ED with a 1 week history of nonproductive cough, dizziness and progressive weakness, resulting in a fall on the morning of arrival.  Patient apparently went to use the bathroom and stumbled falling in a sitting position onto the commode. He did not get hurt. Wife who gives most of the history denies noticing slurred speech or facial droop or change in mentation. She did say his legs appeared weak. Patient saw his cardiologist on the morning of arrival and they were referred to the emergency room for further evaluation.  ED course: Vitals within normal limits on arrival.  Blood work significant for troponin of 136 (baseline in the low 100s), creatinine 1.83 which is around his baseline, WBC 10.6 with hemoglobin 14.1.  Urinalysis unremarkable.  Procalcitonin, COVID and flu pending.  EKG, personally viewed and interpreted: Sinus at 61 with nonspecific ST-T wave changes.  C-spine CT nonacute but did show large left pleural effusion.  Chest x-ray also shows moderate left pleural effusion. ?  ?Clinical Impression ? Pt admitted with above diagnosis. Pt received laying in bed with spouse at bedside. Pt has dementia and is unreliable historian so pt's spouse reporting PLOF, home lay out, etc. At baseline pt is a community and household ambulator with and without AD. Spouse reports worsening of dementia in the last 6 months but this has not really affected pt's ability to ambulate. Pt resting on 3 L/min [place] but RN stating pt post  thoracocentesis to titrate supplemental O2 as appropriate. Pt places on RA maintaining SPO2 90-96% at rest and with mobility. To date, pt requiring HOB maximally elevated with max hand over hand cues for LE sequencing to EOB and maxA at torso to sit EoB with maxA to scoot to EoB so feet reach floor. Pt only able to toelrate static sitting without support for <1 min then consistently relies on minA at shoudler to prevent post lean. Pt following single step commands <25% of the time with max hand over hand cues for seated balance and standing balance as pt has very difficult time understanding how to safely stand to RW for safe hand placement. Very limited grip noticed on R hand compared to left with very limited shoulder mobility bilaterally but R > L.  X3 STS trials performed all with minguard to very minA but pt has significant post bias and R lateral lean due to inability to anteriorly weight shift over BOS. Pt requires PT feet to block pt's feet to prevent sliding OOB. Between trials PT demo and education provided with further assistance in LE placement further under pt's BOS but with very limited carryover on last two trials relying on maxA from Pt to maintain static standing. Pt total assist to return to supine in bed with all needs in reach with RN aware of pt on RA with sats >/= 90%. Pt's spouse educated on current need pt would require to safely return home at this time and STR is most appropriate due to significant change in mobility from baseline with deficits in bed mobility, command following, safe/correct use of DME,static sitting/standing balance and inability to  safely stand at this time without maxA to prevent falls. Pt currently with functional limitations due to the deficits listed below (see PT Problem List). Pt will benefit from skilled PT to increase their independence and safety with mobility to allow discharge to the venue listed below.       ?   ? ?Recommendations for follow up therapy are  one component of a multi-disciplinary discharge planning process, led by the attending physician.  Recommendations may be updated based on patient status, additional functional criteria and insurance authorization. ? ?Follow Up Recommendations Skilled nursing-short term rehab (<3 hours/day) ? ?  ?Assistance Recommended at Discharge Frequent or constant Supervision/Assistance  ?Patient can return home with the following ? Two people to help with walking and/or transfers;Two people to help with bathing/dressing/bathroom;Direct supervision/assist for medications management;Help with stairs or ramp for entrance;Assist for transportation ? ?  ?Equipment Recommendations Other (comment) (tbd by next venue of care)  ?Recommendations for Other Services ?    ?  ?Functional Status Assessment Patient has had a recent decline in their functional status and/or demonstrates limited ability to make significant improvements in function in a reasonable and predictable amount of time  ? ?  ?Precautions / Restrictions Precautions ?Precautions: Fall ?Restrictions ?Weight Bearing Restrictions: No  ? ?  ? ?Mobility ? Bed Mobility ?Overal bed mobility: Needs Assistance ?Bed Mobility: Supine to Sit, Sit to Supine ?  ?  ?Supine to sit: Max assist, HOB elevated ?Sit to supine: Total assist ?  ?General bed mobility comments: Required hand over hand and max VC's for sequencing LE's to EOB. maxA at trunk to attain sitting EOB ?Patient Response: Cooperative ? ?Transfers ?Overall transfer level: Needs assistance ?Equipment used: Rolling walker (2 wheels) ?Transfers: Sit to/from Stand ?Sit to Stand: Min guard ?  ?  ?  ?  ?  ?General transfer comment: ABle to stand with minguard with RW however has significant posterior bias with inability to shift weight anteriorly over BOS ?  ? ?Ambulation/Gait ?  ?  ?  ?  ?  ?  ?  ?General Gait Details: Unsafe to perform ? ?Stairs ?  ?  ?  ?  ?  ? ?Wheelchair Mobility ?  ? ?Modified Rankin (Stroke Patients  Only) ?  ? ?  ? ?Balance Overall balance assessment: Needs assistance ?Sitting-balance support: Feet supported, Bilateral upper extremity supported ?Sitting balance-Leahy Scale: Poor ?Sitting balance - Comments: Posterior bias with UE supported. Able to tolerate upright sitting with close supervision for < 1 minute. ?Postural control: Posterior lean, Right lateral lean ?Standing balance support: Bilateral upper extremity supported, During functional activity, Reliant on assistive device for balance ?Standing balance-Leahy Scale: Poor ?Standing balance comment: R lateral lean in standing with post bias due to inability to anteriorly weight shift in standing over BOS. ?  ?  ?  ?  ?  ?  ?  ?  ?  ?  ?  ?   ? ? ? ?Pertinent Vitals/Pain Pain Assessment ?Pain Assessment: No/denies pain  ? ? ?Home Living Family/patient expects to be discharged to:: Private residence ?Living Arrangements: Spouse/significant other ?Available Help at Discharge: Family;Available 24 hours/day ?Type of Home: House ?Home Access: Stairs to enter ?Entrance Stairs-Rails: Left ?Entrance Stairs-Number of Steps: 4 ?  ?Home Layout: One level ?Home Equipment: Conservation officer, nature (2 wheels);Shower seat ?   ?  ?Prior Function Prior Level of Function : Needs assist ?  ?  ?  ?  ?  ?  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 ?ADLs Comments: Wife reports she assists with self care tasks at home secondary to pt with decreased energy. ?  ? ? ?Hand Dominance  ? Dominant Hand: Right ? ?  ?Extremity/Trunk Assessment  ? Upper Extremity Assessment ?Upper Extremity Assessment: Defer to OT evaluation;RUE deficits/detail ?RUE Deficits / Details: Pt appears to have difficulty gripping walker on R hand comapred to L hand ?  ? ?Lower Extremity Assessment ?Lower Extremity Assessment: Generalized weakness ?  ? ?   ?Communication  ? Communication: HOH  ?Cognition Arousal/Alertness: Awake/alert ?Behavior  During Therapy: [facility] for tasks assessed/performed ?Overall Cognitive Status: History of cognitive impairments - at baseline ?  ?  ?  ?  ?  ?  ?  ?  ?  ?  ?  ?  ?  ?  ?  ?  ?General Comments: Requiring max hand over han

## 2021-06-19 NOTE — Evaluation (Signed)
Occupational Therapy Evaluation Patient Details Name: Robert Frye MRN: [id number] DOB: 04-24-1931 Today's Date: 06/19/2021   History of Present Illness 88 y.o. male with medical history significant for A-fib on Eliquis s/p cardioversion 03/13/2020 and 06/11/2020, HFpEF (EF 50 to 55% 05/2020), chronically elevated troponin with intermediate risk Lexiscan 03/2020, CKD 3B, dementia, hypothyroidism, HTN, chronic dyspnea with history of pleural effusion s/p thoracentesis 10/2020 who presents to the ED with a 1 week history of nonproductive cough, dizziness and progressive weakness, resulting in a fall on the morning of arrival.  Patient apparently went to use the bathroom and stumbled falling in a sitting position onto the commode. He did not get hurt. Wife who gives most of the history denies noticing slurred speech or facial droop or change in mentation. She did say his legs appeared weak. Patient saw his cardiologist on the morning of arrival and they were referred to the emergency room for further evaluation.  ED course: Vitals within normal limits on arrival.  Blood work significant for troponin of 136 (baseline in the low 100s), creatinine 1.83 which is around his baseline, WBC 10.6 with hemoglobin 14.1.  Urinalysis unremarkable.  Procalcitonin, COVID and flu pending.  EKG, personally viewed and interpreted: Sinus at 61 with nonspecific ST-T wave changes.  C-spine CT nonacute but did show large left pleural effusion.  Chest x-ray also shows moderate left pleural effusion.   Clinical Impression   Patient presenting with decreased Ind in self care,balance, endurance, functional mobility/transfers, and safety awareness. Patient is HOH and wife present in room who is able to confirm baseline.  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 Pt needs assist from wife for self care tasks and she performs majority of IADLs at home. Pt is on 4Ls O2 via [place] this session and needs max A  for bed mobility and sit <>stand. He is able to take one small shuffling step before returning to bed. O2 drops to 85% while standing and recovers with cuing for pursed lip breathing.   Patient will benefit from acute OT to increase overall independence in the areas of ADLs, functional mobility, and safety awareness in order to safely discharge to next venue of care.      Recommendations for follow up therapy are one component of a multi-disciplinary discharge planning process, led by the attending physician.  Recommendations may be updated based on patient status, additional functional criteria and insurance authorization.   Follow Up Recommendations  Skilled nursing-short term rehab (<3 hours/day)    Assistance Recommended at Discharge Frequent or constant Supervision/Assistance  Patient can return home with the following A lot of help with walking and/or transfers;A lot of help with bathing/dressing/bathroom;Assistance with cooking/housework;Help with stairs or ramp for entrance;Assist for transportation;Direct supervision/assist for financial management;Direct supervision/assist for medications management    Functional Status Assessment  Patient has had a recent decline in their functional status and demonstrates the ability to make significant improvements in function in a reasonable and predictable amount of time.  Equipment Recommendations  Other (comment) (defer to next venue of care)       Precautions / Restrictions Precautions Precautions: Fall      Mobility Bed Mobility Overal bed mobility: Needs Assistance Bed Mobility: Supine to Sit, Sit to Supine     Supine to sit: Max assist Sit to supine: Total assist   General bed mobility comments: assist for trunk support and B LEs. He is able to move B LEs towards  EOB but still needing assist to get over EOB.    Transfers Overall transfer level: Needs assistance Equipment used: 1 person hand held assist Transfers: Sit to/from  Stand Sit to Stand: Max assist                  Balance Overall balance assessment: Needs assistance Sitting-balance support: Feet supported, Bilateral upper extremity supported Sitting balance-Leahy Scale: Fair     Standing balance support: During functional activity Standing balance-Leahy Scale: Poor                             ADL either performed or assessed with clinical judgement   ADL Overall ADL's : Needs assistance/impaired                                       General ADL Comments: min A for UB self care and max A for self care.     Vision Patient Visual Report: No change from baseline              Pertinent Vitals/Pain Pain Assessment Pain Assessment: No/denies pain     Hand Dominance Right   Extremity/Trunk Assessment Upper Extremity Assessment Upper Extremity Assessment: Generalized weakness;RUE deficits/detail RUE Deficits / Details: Pt does report mild pain in R shoulder with elevation which wife reports is baseline   Lower Extremity Assessment Lower Extremity Assessment: Generalized weakness       Communication Communication Communication: HOH   Cognition Arousal/Alertness: Awake/alert Behavior During Therapy: WFL for tasks assessed/performed Overall Cognitive Status: History of cognitive impairments - at baseline                                 General Comments: Pt is pleasant and follows commands with increased time and cuing                Home Living Family/patient expects to be discharged to:: Private residence Living Arrangements: Spouse/significant other Available Help at Discharge: Family;Available 24 hours/day Type of Home: House Home Access: Stairs to enter [employer] of Steps: 4 Entrance Stairs-Rails: Left Home Layout: One level     Bathroom Shower/Tub: Tub/shower unit;Walk-in shower   Bathroom Toilet: Standard     Home Equipment: Agricultural consultant (2  wheels);Shower seat          Prior Functioning/Environment Prior Level of Function : Needs assist             Mobility Comments: Pt ambulates with RW and sometimes without and 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 ADLs Comments: Wife reports she assists with self care tasks at home secondary to pt with decreased energy.        OT Problem List: Decreased strength;Decreased knowledge of use of DME or AE;Decreased activity tolerance;Impaired balance (sitting and/or standing);Decreased safety awareness;Cardiopulmonary status limiting activity      OT Treatment/Interventions: Self-care/ADL training;Manual therapy;Therapeutic exercise;Patient/family education;Balance training;Energy conservation;Therapeutic activities;DME and/or AE instruction    OT Goals(Current goals can be found in the care plan section) Acute Rehab OT Goals Patient Stated Goal: to get stronger and be able to walk OT Goal Formulation: With patient/family Time For Goal Achievement: 07/03/21 Potential to Achieve Goals: Fair ADL Goals Pt Will Perform Grooming: with min assist;standing Pt Will Perform Lower Body Dressing: with min assist;sit  to/from stand Pt Will Transfer to Toilet: with min assist;ambulating Pt Will Perform Toileting - Clothing Manipulation and hygiene: with min assist;sit to/from stand  OT Frequency: Min 2X/week       AM-PAC OT "6 Clicks" Daily Activity     Outcome Measure Help from another person eating meals?: None Help from another person taking care of personal grooming?: A Little Help from another person toileting, which includes using toliet, bedpan, or urinal?: Total Help from another person bathing (including washing, rinsing, drying)?: A Lot Help from another person to put on and taking off regular upper body clothing?: A Little Help from another person to put on and taking off regular lower body clothing?: Total 6 Click Score: 14   End of Session Nurse Communication:  Mobility status;Other (comment)  Activity Tolerance: Patient tolerated treatment well Patient left: in bed;with call bell/phone within reach  OT Visit Diagnosis: Unsteadiness on feet (R26.81);Repeated falls (R29.6);Muscle weakness (generalized) (M62.81)                Time: 9528-4132 OT Time Calculation (min): 17 min Charges:  OT General Charges $OT Visit: 1 Visit OT Evaluation $OT Eval Moderate Complexity: 1 Mod OT Treatments $Therapeutic Activity: 8-22 mins Jackquline Denmark, MS, OTR/L , CBIS ascom [phone]  06/19/21, 1:49 PM

## 2021-06-19 NOTE — ED Notes (Signed)
OT at bedside assessing pt at this time.  

## 2021-06-19 NOTE — Plan of Care (Signed)
?  Problem: Clinical Measurements: ?Goal: Ability to maintain a body temperature in the normal range will improve ?Outcome: Progressing ?  ?Problem: Respiratory: ?Goal: Ability to maintain adequate ventilation will improve ?Outcome: Progressing ?Goal: Ability to maintain a clear airway will improve ?Outcome: Progressing ?  ?Problem: Education: ?Goal: Knowledge of General Education information will improve ?Description: Including pain rating scale, medication(s)/side effects and non-pharmacologic comfort measures ?Outcome: Progressing ?  ?Problem: Health Behavior/Discharge Planning: ?Goal: Ability to manage health-related needs will improve ?Outcome: Progressing ?  ?Problem: Clinical Measurements: ?Goal: Ability to maintain clinical measurements within normal limits will improve ?Outcome: Progressing ?Goal: Will remain free from infection ?Outcome: Progressing ?Goal: Diagnostic test results will improve ?Outcome: Progressing ?Goal: Respiratory complications will improve ?Outcome: Progressing ?Goal: Cardiovascular complication will be avoided ?Outcome: Progressing ?  ?Problem: Nutrition: ?Goal: Adequate nutrition will be maintained ?Outcome: Progressing ?  ?Problem: Coping: ?Goal: Level of anxiety will decrease ?Outcome: Progressing ?  ?Problem: Elimination: ?Goal: Will not experience complications related to bowel motility ?Outcome: Progressing ?Goal: Will not experience complications related to urinary retention ?Outcome: Progressing ?  ?Problem: Pain Managment: ?Goal: General experience of comfort will improve ?Outcome: Progressing ?  ?Problem: Safety: ?Goal: Ability to remain free from injury will improve ?Outcome: Progressing ?  ?Problem: Skin Integrity: ?Goal: Risk for impaired skin integrity will decrease ?Outcome: Progressing ?  ?Problem: Activity: ?Goal: Ability to tolerate increased activity will improve ?Outcome: Not Progressing ?  ?Problem: Activity: ?Goal: Risk for activity intolerance will  decrease ?Outcome: Not Progressing ?  ?

## 2021-06-19 NOTE — ED Notes (Addendum)
Wrong time entry

## 2021-06-19 NOTE — Progress Notes (Signed)
Admission profile updated. ?

## 2021-06-19 NOTE — ED Notes (Signed)
Notified dr Para March of this pt's bnp, trop results.. no further orders received ?

## 2021-06-19 NOTE — Progress Notes (Signed)
?PROGRESS NOTE ? ? ? ?Robert Frye  [id number] DOB: 1932-02-18 DOA: 06/18/2021 ?PCP: Baxter Hire, MD  ? ? ?Brief Narrative:  ?88 y.o. male with medical history significant for A-fib on Eliquis s/p cardioversion 03/13/2020 and 06/11/2020, HFpEF (EF 50 to 55% 05/2020), chronically elevated troponin with intermediate risk Lexiscan 03/2020, CKD 3B, dementia, hypothyroidism, HTN, chronic dyspnea with history of pleural effusion s/p thoracentesis 10/2020 who presents to the ED with a 1 week history of nonproductive cough, dizziness and progressive weakness, resulting in a fall on the morning of arrival.  Patient apparently went to use the bathroom and stumbled falling in a sitting position onto the commode. He did not get hurt. Wife who gives most of the history denies noticing slurred speech or facial droop or change in mentation. She did say his legs appeared weak. Patient saw his cardiologist on the morning of arrival and they were referred to the emergency room for further evaluation. ? ?Initially there was concern for community-acquired pneumonia.  This is felt unlikely.  Procalcitonin negative.  Patient afebrile.  No white count.  Antibiotics have been discontinued.  Presentation more consistent with mild exacerbation of underlying heart failure. ? ? ?Assessment & Plan: ?  ?Principal Problem: ?  CAP (community acquired pneumonia) ?Active Problems: ?  Weakness ?  Elevated troponin ?  Fall at home, initial encounter ?  Recurrent pleural effusion on left ?  Paroxysmal atrial fibrillation (HCC) ?  Chronic anticoagulation ?  (HFpEF) heart failure with preserved ejection fraction ([place]) ?  Essential hypertension ?  Stage 3b chronic kidney disease ([place]) ?  Dementia without behavioral disturbance ([place]) ?  Hypothyroidism ?  Acute decompensated heart failure ([place]) ? ?Acute decompensated diastolic congestive heart failure ?Moderate left-sided pleural effusion, likely secondary to above ?Patient is known to [facility]  cardiology ?Case discussed with Christell Faith, PA ?Plan: ?Hold home Lasix ?IV Lasix 40 mg daily ?Daily weights ?Strict ins and outs ?Trend UOP ?Target net -1-1.5 L daily ?Ultrasound-guided thoracentesis ? ?Elevated troponin ?Patient with chronic elevation ?Saw primary cardiologist in office 3/27 ?Currently no indication for inpatient cardiology consult ?Can notify [facility] if any inpatient issues arise ? ?Paroxysmal atrial fibrillation ?Status post DC cardioversion twice in the past ?Currently heart rate stable ?Continue amiodarone ?Apixaban on hold pending thoracentesis ?Can likely restart tomorrow 3/29 ?Telemetry monitoring for now ? ?Community-acquired pneumonia, ruled out ?No obvious infiltrate on chest x-ray ?Procalcitonin negative, patient afebrile, no white count ?Discontinue antibiotics ? ?Weakness ?Likely multifactorial ?CT scan negative ?No neurologic deficits ?Therapy evaluations ? ?Hypothyroidism ?PTA Synthroid ? ?Dementia without behavioral disturbance ?Delirium precautions ? ?Stage IIIb chronic kidney disease ?Creatinine at or near baseline ?Monitor while on IV diuretics ? ?Essential hypertension ?IV Lasix as above ? ? ?DVT prophylaxis: Eliquis ?Code Status: DNR ?Family Communication: Wife at bedside 3/28 ?Disposition Plan: Status is: Inpatient ?Remains inpatient appropriate because: Mild acute decompensated diastolic heart failure.  Possible discharge in 24 hours.  Pending therapy evaluations and ultrasound-guided thoracentesis ? ? ?Level of care: Med-Surg ? ?Consultants:  ?None ? ?Procedures:  ?Ultrasound-guided thoracentesis 3/28 ? ?Antimicrobials: ?None (Rocephin and azithromycin discontinued) ? ? ?Subjective: ?Seen and examined.  Lying in bed.  No visible distress.  Does appear fatigued.  Wife at bedside.  No pain complaints ? ?Objective: ?Vitals:  ? 06/19/21 0430 06/19/21 0500 06/19/21 0530 06/19/21 [id number]  ?BP: 127/63 (!) 121/103 104/85 (!) 170/91  ?Pulse: 73 78 76 78  ?Resp: 15 16  20   ?Temp:  98.1 ?F  (36.7 ?C)  97.6 ?F (36.4 ?C)  ?TempSrc:    Oral  ?SpO2: 100% 92% 94% 92%  ? ? ?Intake/Output Summary (Last 24 hours) at 06/19/2021 1117 ?Last data filed at 06/19/2021 0813 ?Gross per 24 hour  ?Intake 765 ml  ?Output --  ?Net 765 ml  ? ?There were no vitals filed for this visit. ? ?Examination: ? ?General exam: No acute distress.  Frail-appearing ?Respiratory system: Scattered crackles bilaterally.  Worse on left.  Normal work of breathing.  2 L ?Cardiovascular system: S1-S2, regular rate, irregular rhythm, no murmurs ?Gastrointestinal system: Soft, NT/ND, normal bowel sounds ?Central nervous system: Alert and oriented. No focal neurological deficits. ?Extremities: Symmetric 5 x 5 power. ?Skin: No rashes, lesions or ulcers ?Psychiatry: Judgement and insight appear normal. Mood & affect appropriate.  ? ? ? ?Data Reviewed: I have personally reviewed following labs and imaging studies ? ?CBC: ?Recent Labs  ?Lab 06/18/21 ?1427 06/18/21 ?1710 06/19/21 ?0231  ?WBC 10.6 10.6* 11.1*  ?NEUTROABS 8.7*  --   --   ?HGB 14.1 14.1 12.7*  ?HCT 40.9 44.0 39.1  ?MCV 98* 103.3* 102.1*  ?PLT 296 299 261  ? ?Basic Metabolic Panel: ?Recent Labs  ?Lab 06/18/21 ?1427 06/18/21 ?1710 06/18/21 ?1756 06/19/21 ?0231  ?NA 145* 140  --  141  ?K 4.1 3.9  --  3.8  ?CL 104 103  --  108  ?CO2 22 25  --  25  ?GLUCOSE 139* 96  --  97  ?BUN 26 27*  --  24*  ?CREATININE 1.85* 1.83*  --  1.62*  ?CALCIUM 9.9 9.5  --  8.8*  ?MG  --   --  2.4  --   ? ?GFR: ?Estimated Creatinine Clearance: 27.2 mL/min (A) (by C-G formula based on SCr of 1.62 mg/dL (H)). ?Liver Function Tests: ?No results for input(s): AST, ALT, ALKPHOS, BILITOT, PROT, ALBUMIN in the last 168 hours. ?No results for input(s): LIPASE, AMYLASE in the last 168 hours. ?No results for input(s): AMMONIA in the last 168 hours. ?Coagulation Profile: ?No results for input(s): INR, PROTIME in the last 168 hours. ?Cardiac Enzymes: ?No results for input(s): CKTOTAL, CKMB, CKMBINDEX, TROPONINI in the last 168  hours. ?BNP (last 3 results) ?No results for input(s): PROBNP in the last 8760 hours. ?HbA1C: ?No results for input(s): HGBA1C in the last 72 hours. ?CBG: ?No results for input(s): GLUCAP in the last 168 hours. ?Lipid Profile: ?No results for input(s): CHOL, HDL, LDLCALC, TRIG, CHOLHDL, LDLDIRECT in the last 72 hours. ?Thyroid Function Tests: ?No results for input(s): TSH, T4TOTAL, FREET4, T3FREE, THYROIDAB in the last 72 hours. ?Anemia Panel: ?No results for input(s): VITAMINB12, FOLATE, FERRITIN, TIBC, IRON, RETICCTPCT in the last 72 hours. ?Sepsis Labs: ?Recent Labs  ?Lab 06/18/21 ?1952  ?PROCALCITON <0.10  ? ? ?No results found for this or any previous visit (from the past 240 hour(s)).  ? ? ? ? ? ?Radiology Studies: ?DG Chest 2 View ? ?Result Date: 06/18/2021 ?CLINICAL DATA:  Weakness. EXAM: CHEST - 2 VIEW COMPARISON:  Chest radiograph dated 05/30/2020. FINDINGS: Moderate left pleural effusion and left lung base atelectasis or infiltrate. The right lung is clear. There is no pneumothorax. The cardiac silhouette is within normal limits. Atherosclerotic calcification of the aorta. Degenerative changes of the spine. No acute osseous pathology. IMPRESSION: Moderate left pleural effusion and left lung base atelectasis or infiltrate. Electronically Signed   By: Anner Crete M.D.   On: 06/18/2021 19:49  ? ?CT HEAD WO CONTRAST (5MM) ? ?Result Date: 06/18/2021 ?CLINICAL  DATA:  Right lower extremity weakness worsening for several weeks, history of dementia EXAM: CT HEAD WITHOUT CONTRAST TECHNIQUE: Contiguous axial images were obtained from the base of the skull through the vertex without intravenous contrast. RADIATION DOSE REDUCTION: This exam was performed according to the departmental dose-optimization program which includes automated exposure control, adjustment of the mA and/or kV according to patient size and/or use of iterative reconstruction technique. COMPARISON:  11/27/2012 FINDINGS: Brain: Mild diffuse  cerebral cortical atrophy with ex vacuo dilatation of the lateral ventricles. No acute infarct or hemorrhage. Lateral ventricles and midline structures are otherwise unremarkable. No acute extra-axial fluid collection

## 2021-06-20 ENCOUNTER — Inpatient Hospital Stay: Payer: Medicare Other

## 2021-06-20 DIAGNOSIS — R531 Weakness: Secondary | ICD-10-CM

## 2021-06-20 LAB — LEGIONELLA PNEUMOPHILA SEROGP 1 UR AG: L. pneumophila Serogp 1 Ur Ag: NEGATIVE

## 2021-06-20 MED ORDER — DIAZEPAM 5 MG/ML IJ SOLN: 2.5000 mg | Freq: Once | INTRAMUSCULAR | Status: DC

## 2021-06-20 MED ORDER — APIXABAN 2.5 MG PO TABS
2.5000 mg | ORAL_TABLET | Freq: Two times a day (BID) | ORAL | Status: DC
Start: 2021-06-20 — End: 2021-06-21
  Administered 2021-06-20 – 2021-06-21 (×2): 2.5 mg via ORAL
  Filled 2021-06-20 (×2): qty 1

## 2021-06-20 MED ORDER — FUROSEMIDE 10 MG/ML IJ SOLN
40.0000 mg | Freq: Every day | INTRAMUSCULAR | Status: DC
  Administered 2021-06-20 – 2021-06-22 (×3): 40 mg via INTRAVENOUS
  Filled 2021-06-20 (×3): qty 4

## 2021-06-20 MED ORDER — LORAZEPAM 2 MG/ML IJ SOLN
1.0000 mg | Freq: Once | INTRAMUSCULAR | Status: AC | PRN
Start: 2021-06-20 — End: 2021-06-20
  Administered 2021-06-20: 1 mg via INTRAVENOUS
  Filled 2021-06-20: qty 1

## 2021-06-20 NOTE — Evaluation (Signed)
Clinical/Bedside Swallow Evaluation ?Patient Details  ?Name: Robert Frye ?MRN: [id number] ?Date of Birth: [birth year] ? ?Today's Date: 06/20/2021 ?Time: SLP Start Time (ACUTE ONLY): [id number] SLP Stop Time (ACUTE ONLY): [id number] ?SLP Time Calculation (min) (ACUTE ONLY): 60 min ? ?Past Medical History:  ?Past Medical History:  ?Diagnosis Date  ? (HFpEF) heart failure with preserved ejection fraction ([place])   ? CKD (chronic kidney disease), stage III ([place])   ? Dementia ([place])   ? History of stress test   ? a. 03/2020 MV: No ischemia/infarct. EF 40% (55-60% by echo 01/2020).  ? Hyperlipidemia   ? Hypertension   ? Hypertrophic cardiomyopathy ([place])   ? a. 01/2020 Echo: EF 55-60%, no rwma. Sev eccentric LVH of apical and basal-septal segments. gr1 DD. Mildly enlarged RV w/ nl RV fxn. sev dil LA, mod dil RA. Mild MR.  ? Hypothyroidism   ? Persistent atrial fibrillation ([place])   ? a. Dx 01/2020 s/p DCCV 02/2020; b. 02/2020 Recurrent Afib noted-->rate controlled-->amio started 05/2020 in setting of ongoing CHF; c. CHA2DS2VASc = 4-->Eliquis 2.5 BID.  ? ?Past Surgical History:  ?Past Surgical History:  ?Procedure Laterality Date  ? CARDIOVERSION N/A 03/14/2020  ? Procedure: CARDIOVERSION;  Surgeon: Wellington Hampshire, MD;  Location: [facility];  Service: Cardiovascular;  Laterality: N/A;  ? CARDIOVERSION N/A 05/31/2020  ? Procedure: CARDIOVERSION;  Surgeon: Kate Sable, MD;  Location: [facility];  Service: Cardiovascular;  Laterality: N/A;  ? CATARACT EXTRACTION W/PHACO Right 03/13/2021  ? Procedure: CATARACT EXTRACTION PHACO AND INTRAOCULAR LENS PLACEMENT (IOC) RIGHT 20.48 01:55.4;  Surgeon: Birder Robson, MD;  Location: [place];  Service: Ophthalmology;  Laterality: Right;  ? CATARACT EXTRACTION W/PHACO Left 04/03/2021  ? Procedure: CATARACT EXTRACTION PHACO AND INTRAOCULAR LENS PLACEMENT (IOC) LEFT 12.07 00:58.5;  Surgeon: Birder Robson, MD;  Location: [place];  Service: Ophthalmology;  Laterality: Left;  ?  HERNIA REPAIR    ? ?HPI:  ?Pt is an 88 y.o. male with medical history significant for A-fib on Eliquis s/p cardioversion 03/13/2020 and 06/11/2020, HFpEF (EF 50 to 55% 05/2020), chronically elevated troponin with intermediate risk Lexiscan 03/2020, CKD 3B, dementia, hypothyroidism, HTN, chronic dyspnea with history of pleural effusion s/p thoracentesis 10/2020 who presents to the ED with a 1 week history of nonproductive cough, dizziness and progressive weakness, resulting in a fall on the morning of arrival.  Patient apparently went to use the bathroom and stumbled falling in a sitting position onto the commode. He did not get hurt. Wife who gives most of the history Denies noticing slurred speech or facial droop or change in mentation. She did say his legs appeared weak. Patient saw his cardiologist on the morning of arrival and they were referred to the emergency room for further evaluation.  ED course: Vitals within normal limits on arrival.  C-spine CT nonacute but did show large left pleural effusion.  Chest x-ray also shows moderate left pleural effusion.  Admitting Dx: Acute decompensated diastolic congestive heart failure  Moderate left-sided pleural effusion, likely secondary to above. Lasix given.  ?Thoracentesis completed on 06/19/2021: A total of approximately 1 L of clear yellow fluid was removed.  CXR post: Status post thoracentesis with interval significant decrease in size ?of the left pleural effusion. No appreciable pneumothorax. ? ?  ?Assessment / Plan / Recommendation  ?Clinical Impression ? Pt seen for BSE this PM. Wife present. Pt awake, pleasant. Confused and needed cues for direction. He engaged verbally and enjoyed telling "stories". HOH, aided in R ear  only. Pt often expectorated thin phlegm -- Wife stated this is Baseline at home. ?OF NOTE: Wife reported no change in pt's communication/speech presentation in the setting of Baseline Cognitive decline.  ? ?Pt appears to present w/ adequate  oropharyngeal phase swallow function w/ No oropharyngeal phase dysphagia noted, No overt neuromuscular weakness noted. Pt consumed po trials w/ No overt, clinical s/s of aspiration during the po trials. Pt appears at reduced risk for aspiration when following general aspiration precautions, using a slightly modified diet of cut/moistened foods, and when given support w/ setup and feeding at meal (RUE weakness present today - MD aware of).  ? ?OF NOTE, pt has a Baseline dx of Dementia -- any Cognitive decline can impact oropharyngeal phase swallowing thus increase risk for aspiration, choking. Pt endorsed, and Wife agreed, that he has "some" difficulty swallowing Pills -- Wife stated she usually Crushes the bigger tablets and give ALL PILLS ONE  AT A TIME IN APPLESAUCE. Full agreed w/ this strategy/precaution in order to reduce risk for aspiration of Pills and liquid.  ? ?During po trials, pt consumed all consistencies given w/ no overt coughing, decline in vocal quality, or change in respiratory presentation during/post trials. NO STRAWS WERE USED - pt drinks from a Cup at home Baseline. Oral phase appeared grossly [facility] w/ timely bolus management, mastication, and control of bolus propulsion for A-P transfer for swallowing. Min increased "munching" behavior w/ the increased textured trials noted, but overall adequate. Oral clearing achieved w/ all trial consistencies. Pt did NOT spit/expectorate any po's during assessment.  ? ?OM Exam appeared [facility] w/ no unilateral weakness noted. Speech Clear. Pt fed self w/ min setup support and support of Cup d/t shaky LUE (RUE weakness currently).  ? ?Pt appears to present w/ some risk factors for aspiration including Cognitive decline/Dementia at Baseline, advanced age, and current medical illness(es). He also exhibits RUE weakness currently impacting his ability to feed himself normally.  ?Recommend continue a Regular/Mech Soft consistency diet w/ well-Cut meats, moistened  foods; Thin liquids VIA CUP - NO STRAWS. Recommend general aspiration precautions, tray setup and feeding support at meals d/t Cognitive decline, weakness. Pills WHOLE vs CRUSHED in Puree for safer, easier swallowing as Baseline for pt.  ?Much Education given on Pills in Puree and why; food consistencies and easy to eat options; general aspiration precautions; strategies to reduce the impact of Cognitive decline on oral intake, swallowing. NSG to reconsult if any new needs arise. NSG agreed. Wife and Sons all agreed.  ?SLP Visit Diagnosis: Dysphagia, unspecified (R13.10) ?   ?Aspiration Risk ?  (reduced following general aspiration precautions; support at meals and w/ po's)  ?  ?Diet Recommendation   Regular/Mech Soft consistency diet w/ well-Cut meats, moistened foods; Thin liquids VIA CUP - NO STRAWS. Recommend general aspiration precautions; tray setup and feeding support at meals d/t Cognitive decline, weakness. ? ?Medication Administration: Whole meds with puree (vs need to Crush w/ puree)  ?  ?Other  Recommendations Recommended Consults:  (Dietician f/u; Palliative care f/u for GOC) ?Oral Care Recommendations: Oral care BID;Oral care before and after PO;Staff/trained caregiver to provide oral care ?Other Recommendations:  (n/a)   ? ?Recommendations for follow up therapy are one component of a multi-disciplinary discharge planning process, led by the attending physician.  Recommendations may be updated based on patient status, additional functional criteria and insurance authorization. ? ?Follow up Recommendations No SLP follow up  ? ? ?  ?Assistance Recommended at Discharge Frequent or constant  Supervision/Assistance (baseline Dementia; RUE weakness)  ?Functional Status Assessment Patient has not had a recent decline in their functional status (re: swallowing)  ?Frequency and Duration  (n/a)  ? (n/a) ?  ?   ? ?Prognosis Prognosis for Safe Diet Advancement: Fair ?Barriers to Reach Goals: Cognitive deficits;Time  post onset;Severity of deficits ?Barriers/Prognosis Comment: baseline Dementia; RUE weakness  ? ?  ? ?Swallow Study   ?General Date of Onset: 06/18/21 ?HPI: Pt is an 88 y.o. male with medical history significa

## 2021-06-20 NOTE — TOC Initial Note (Signed)
Transition of Care (TOC) - Initial/Assessment Note  ? ? ?Patient Details  ?Name: Robert Frye ?MRN: [id number] ?Date of Birth: 1932-03-25 ? ?Transition of Care (TOC) CM/SW Contact:    ?Alberteen Sam, LCSW ?Phone Number: ?06/20/2021, 9:23 AM ? ?Clinical Narrative:                 ? ?CSW spoke with patient's spouse Robert Frye regarding PT recs of SNF.  ? ?She reports she currently cares for patient at home and is sole caregiver. States patient has never been in a facility and expressed concerns he would not do well in one, as he often looks to her for support and comfort.  ? ?Robert Frye reports they have long term care insurance and hopeful if they decided to go home with home health, they could get additional support at home through this. CSW encouraged her to look into patient's long term care benefits.  ? ?Robert Frye reports she would like to see how patient progresses while in the hospital before making a decision as to SNF vs HH.  ? ?TOC will continue to follow.  ? ? ?Expected Discharge Plan:  (TBD) ?Barriers to Discharge: Continued Medical Work up ? ? ?Patient Goals and CMS Choice ?Patient states their goals for this hospitalization and ongoing recovery are:: to go home ?CMS Medicare.gov Compare Post Acute Care list provided to:: Patient Represenative (must comment) (spouse Robert Frye) ?Choice offered to / list presented to : Spouse ? ?Expected Discharge Plan and Services ?Expected Discharge Plan:  (TBD) ?  ?  ?  ?Living arrangements for the past 2 months: [place] ?                ?  ?  ?  ?  ?  ?  ?  ?  ?  ?  ? ?Prior Living Arrangements/Services ?Living arrangements for the past 2 months: [place] ?Lives with:: Spouse ?  ?       ?  ?  ?  ?  ? ?Activities of Daily Living ?Home Assistive Devices/Equipment: Built-in shower seat, Environmental consultant (specify type), Cane (specify quad or straight), Hearing aid ?ADL Screening (condition at time of admission) ?Patient's cognitive ability adequate to safely complete daily  activities?: Yes ?Is the patient deaf or have difficulty hearing?: Yes ?Does the patient have difficulty seeing, even when wearing glasses/contacts?: No ?Does the patient have difficulty concentrating, remembering, or making decisions?: Yes ?Patient able to express need for assistance with ADLs?: Yes ?Does the patient have difficulty dressing or bathing?: No ?Independently performs ADLs?: Yes (appropriate for developmental age) ?Does the patient have difficulty walking or climbing stairs?: Yes ?Weakness of Legs: Both ?Weakness of Arms/Hands: None ? ?Permission Sought/Granted ?  ?  ?   ?   ?   ?   ? ?Emotional Assessment ?  ?  ?  ?  ?Alcohol / Substance Use: Not Applicable ?Psych Involvement: No (comment) ? ?Admission diagnosis:  Weakness [R53.1] ?Elevated troponin [R77.8] ?Acute decompensated heart failure (HCC) [I50.9] ?Weakness of both lower extremities [R29.898] ?Community acquired pneumonia of left lower lobe of lung [J18.9] ?Patient Active Problem List  ? Diagnosis Date Noted  ? Acute decompensated heart failure ([place]) 06/19/2021  ? Weakness 06/18/2021  ? Fall at home, initial encounter 06/18/2021  ? (HFpEF) heart failure with preserved ejection fraction ([place]) 06/18/2021  ? Chronic anticoagulation 06/18/2021  ? Recurrent pleural effusion on left 06/18/2021  ? CAP (community acquired pneumonia) 06/18/2021  ? Atrial flutter ([place])   ?  Acute on chronic systolic (congestive) heart failure ([place]) 05/27/2020  ? Persistent atrial fibrillation ([place])   ? Acute on chronic heart failure with preserved ejection fraction (HFpEF) ([place])   ? Acquired thrombophilia ([place])   ? AKI (acute kidney injury) ([place])   ? Paroxysmal atrial fibrillation ([place]) 02/13/2020  ? Hypokalemia 02/13/2020  ? Elevated troponin 02/13/2020  ? Essential hypertension 02/13/2020  ? HLD (hyperlipidemia) 02/13/2020  ? Stage 3b chronic kidney disease ([place]) 02/13/2020  ? Acute CHF (congestive heart failure) ([place]) 02/13/2020  ? Dementia without behavioral  disturbance ([place]) 04/21/2017  ? Hypothyroidism 10/07/2013  ? ?PCP:  Baxter Hire, MD ?Pharmacy:   ?[facility] - [place], [place] - [address] ?[address] ?[place] [address] ?Phone: [phone] Fax: [phone] ? ?[facility] ([place], [place] ?[address] ?[address] ?[place] [address] ?Phone: [phone] Fax: [phone] ? ? ? ? ?Social Determinants of Health (SDOH) Interventions ?  ? ?Readmission Risk Interventions ?   ? View : No data to display.  ?  ?  ?  ? ? ? ?

## 2021-06-20 NOTE — Progress Notes (Signed)
Occupational Therapy Treatment ?Patient Details ?Name: Robert Frye ?MRN: [id number] ?DOB: 1931/05/21 ?Today's Date: 06/20/2021 ? ? ?History of present illness 88 y.o. male with medical history significant for A-fib on Eliquis s/p cardioversion 03/13/2020 and 06/11/2020, HFpEF (EF 50 to 55% 05/2020), chronically elevated troponin with intermediate risk Lexiscan 03/2020, CKD 3B, dementia, hypothyroidism, HTN, chronic dyspnea with history of pleural effusion s/p thoracentesis 10/2020 who presents to the ED with a 1 week history of nonproductive cough, dizziness and progressive weakness, resulting in a fall on the morning of arrival.  Patient apparently went to use the bathroom and stumbled falling in a sitting position onto the commode. He did not get hurt. Wife who gives most of the history denies noticing slurred speech or facial droop or change in mentation. She did say his legs appeared weak. Patient saw his cardiologist on the morning of arrival and they were referred to the emergency room for further evaluation.  ED course: Vitals within normal limits on arrival.  Blood work significant for troponin of 136 (baseline in the low 100s), creatinine 1.83 which is around his baseline, WBC 10.6 with hemoglobin 14.1.  Urinalysis unremarkable.  Procalcitonin, COVID and flu pending.  EKG, personally viewed and interpreted: Sinus at 61 with nonspecific ST-T wave changes.  C-spine CT nonacute but did show large left pleural effusion.  Chest x-ray also shows moderate left pleural effusion. ?  ?OT comments ? Robert Frye was seen for OT/PT co-treatment on this date. Upon arrival to room pt awake/alert, semi-supine in bed, with supportive spouse and family present at bedside. Pt requires MAX A +2 for attempted transfer to [facility], however pt is unable to safely attempt 2/2 signifcant R side lean and inability to come to full stand with assist. Requires MIN A for oral care at bed level with max multimodal cueing for incorporation of BUE  into functional task. Pt noted with decreased functional use of his RUE with trace active elbow/tricep/grip activation this date. Per spouse at bedside, pt is RUE dominant and this appears to be change in functional status since admission. RN in room to assess and MD notified via secure chat. Will continue to follow and support. Pt is progressing toward OT goals and continues to benefit from skilled OT services to maximize return to PLOF and minimize risk of future falls, injury, caregiver burden, and readmission. Will continue to follow POC. Discharge recommendation remains appropriate.  ?  ? ?Recommendations for follow up therapy are one component of a multi-disciplinary discharge planning process, led by the attending physician.  Recommendations may be updated based on patient status, additional functional criteria and insurance authorization. ?   ?Follow Up Recommendations ? Skilled nursing-short term rehab (<3 hours/day)  ?  ?Assistance Recommended at Discharge Frequent or constant Supervision/Assistance  ?Patient can return home with the following ? A lot of help with walking and/or transfers;A lot of help with bathing/dressing/bathroom;Assistance with cooking/housework;Help with stairs or ramp for entrance;Assist for transportation;Direct supervision/assist for financial management;Direct supervision/assist for medications management ?  ?Equipment Recommendations ?    ?  ?Recommendations for Other Services   ? ?  ?Precautions / Restrictions Precautions ?Precautions: Fall ?Restrictions ?Weight Bearing Restrictions: No  ? ? ?  ? ?Mobility Bed Mobility ?Overal bed mobility: Needs Assistance ?Bed Mobility: Supine to Sit, Sit to Supine ?  ?  ?Supine to sit: Max assist, HOB elevated ?Sit to supine: Max assist, +2 for physical assistance ?  ?General bed mobility comments: Required hand over hand and max VC's  for sequencing LE's to EOB. maxA at trunk to attain sitting EOB ?  ? ?Transfers ?Overall transfer level:  Needs assistance ?Equipment used: Rolling walker (2 wheels) ?Transfers: Sit to/from Stand ?Sit to Stand: Mod assist, +2 physical assistance ?  ?  ?  ?  ?  ?General transfer comment: Requires maxA for placement of RUE on RW. R lateral lean with frequent L knee buckling with tactile weight shift on shoulders and pelvis to midline. ?  ?  ?Balance Overall balance assessment: Needs assistance ?Sitting-balance support: Feet supported, Bilateral upper extremity supported ?Sitting balance-Leahy Scale: Poor ?Sitting balance - Comments: Posterior bias with UE supported. Able to tolerate upright sitting with close supervision for < 1 minute. ?Postural control: Posterior lean ?Standing balance support: Bilateral upper extremity supported, During functional activity, Reliant on assistive device for balance ?Standing balance-Leahy Scale: Poor ?Standing balance comment: R lateral lean in standing with post bias due to inability to anteriorly weight shift in standing over BOS. ?  ?  ?  ?  ?  ?  ?  ?  ?  ?  ?  ?   ? ?ADL either performed or assessed with clinical judgement  ? ?ADL Overall ADL's : Needs assistance/impaired ?  ?  ?  ?  ?  ?  ?  ?  ?  ?  ?  ?  ?  ?  ?  ?  ?  ?  ?  ?General ADL Comments: MAX A +2 for attempted transfer to [facility], however pt is unable to safely attempt 2/2 signifcant R side lean and inability to come to full stand with assist. Requires MIN A for oral care at bed level with max multimodal cueing for incorporation of BUE into functional task. Pt noted with decreased functional use of his RUE with trace active elbow/tricep/grip activation this date. Per spouse at bedside, pt is RUE dominant and this appears to be change in functional status since admission. RN in room to assess and MD notified via secure chat. ?  ? ?Extremity/Trunk Assessment Upper Extremity Assessment ?Upper Extremity Assessment: RUE deficits/detail ?RUE Deficits / Details: Pt noted with new (per spouse report) onse of RUE weakness. pt  appears to have some inattention to R side with no active use of his R (dominant) UE into functional tasks despite max multimodal cueing. Pt is able to achieve trace bicep activation with cueing from spouse with maximium effort and concentration on RUE. MD notified. ?  ?Lower Extremity Assessment ?Lower Extremity Assessment: Generalized weakness (Suspect RLE weakness, noted with RLE buckling with standing and increased pushing to L side with mobility attempts. Defer to PT.) ?  ?  ?  ? ?Vision Patient Visual Report: No change from baseline ?  ?  ?Perception   ?  ?Praxis   ?  ? ?Cognition Arousal/Alertness: Awake/alert ?Behavior During Therapy: [facility] for tasks assessed/performed ?Overall Cognitive Status: History of cognitive impairments - at baseline ?  ?  ?  ?  ?  ?  ?  ?  ?  ?  ?  ?  ?  ?  ?  ?  ?General Comments: Requiring max hand over hand cuing for command following and increased time. Very pleasant. Per spouse, is sleepier today than usual. ?  ?  ?   ?Exercises Other Exercises ?Other Exercises: OT facilitated functional activity as described above. See ADL section for additional detail. ? ?  ?Shoulder Instructions   ? ? ?  ?General Comments Vss t/o session. Pt remains  on RA.  ? ? ?Pertinent Vitals/ Pain       Pain Assessment ?Pain Assessment: No/denies pain ? ?Home Living   ?  ?  ?  ?  ?  ?  ?  ?  ?  ?  ?  ?  ?  ?  ?  ?  ?  ?  ? ?  ?Prior Functioning/Environment    ?  ?  ?  ?   ? ?Frequency ? Min 2X/week  ? ? ? ? ?  ?Progress Toward Goals ? ?OT Goals(current goals can now be found in the care plan section) ? Progress towards OT goals: Progressing toward goals ? ?Acute Rehab OT Goals ?Patient Stated Goal: To get stronger ?OT Goal Formulation: With patient/family ?Time For Goal Achievement: 07/03/21 ?Potential to Achieve Goals: Fair  ?Plan Discharge plan remains appropriate;Frequency remains appropriate   ? ?Co-evaluation ? ? ?   ?Reason for Co-Treatment: Complexity of the patient's impairments (multi-system  involvement);Necessary to address cognition/behavior during functional activity;For patient/therapist safety;To address functional/ADL transfers ?PT goals addressed during session: Mobility/safety with mobility;Prop

## 2021-06-20 NOTE — Progress Notes (Signed)
Physical Therapy Treatment ?Patient Details ?Name: Robert Frye ?MRN: [id number] ?DOB: 08/06/1931 ?Today's Date: 06/20/2021 ? ? ?History of Present Illness 88 y.o. male with medical history significant for A-fib on Eliquis s/p cardioversion 03/13/2020 and 06/11/2020, HFpEF (EF 50 to 55% 05/2020), chronically elevated troponin with intermediate risk Lexiscan 03/2020, CKD 3B, dementia, hypothyroidism, HTN, chronic dyspnea with history of pleural effusion s/p thoracentesis 10/2020 who presents to the ED with a 1 week history of nonproductive cough, dizziness and progressive weakness, resulting in a fall on the morning of arrival.  Patient apparently went to use the bathroom and stumbled falling in a sitting position onto the commode. He did not get hurt. Wife who gives most of the history denies noticing slurred speech or facial droop or change in mentation. She did say his legs appeared weak. Patient saw his cardiologist on the morning of arrival and they were referred to the emergency room for further evaluation.  ED course: Vitals within normal limits on arrival.  Blood work significant for troponin of 136 (baseline in the low 100s), creatinine 1.83 which is around his baseline, WBC 10.6 with hemoglobin 14.1.  Urinalysis unremarkable.  Procalcitonin, COVID and flu pending.  EKG, personally viewed and interpreted: Sinus at 61 with nonspecific ST-T wave changes.  C-spine CT nonacute but did show large left pleural effusion.  Chest x-ray also shows moderate left pleural effusion. ? ?  ?PT Comments  ? ? Pt received upright in bed for PT/OT treatment. Pt agreeable with family present in room. Pt resting on RA. CO-treat performed due to worsening dementia limiting safe OOB mobility due to deficits in strength, bed mobility, command following, and safe use of DME. With [facility] elevated pt requiring MAX hand over hand cues for sequencing extremities and maxA+1 at torso to attain sitting EOB. Posterior lean still present in sitting  but with max hand over hand placement of LUE on bed rail pt able to perform bouts of static sitting with supervision for close to 1 min increments. As session goes on b/t standing bouts, post lean in sitting worsens due to fatigue. X3 STS attempts requiring modA+2 performed to attempt transfer to [facility] but with each bout pt having extreme difficulty with R lateral lean. With tactile cues on pelvis and shoulders to attain mid line, with frequent L knee buckling. Pt appears to have no understanding or awareness of RUE requiring max multimodal cues for R hand placement on RW with each bout. After third bout, maxA+2 returning to supine. Noted skin tear on posterior R calf with RN aware and present in room. Pt left in room with RN and OT for further treatment. Attending MD made aware of concerns for RUE deficits. D/c recs remain appropriate at this time.   ?   ?Recommendations for follow up therapy are one component of a multi-disciplinary discharge planning process, led by the attending physician.  Recommendations may be updated based on patient status, additional functional criteria and insurance authorization. ? ?Follow Up Recommendations ? Skilled nursing-short term rehab (<3 hours/day) ?  ?  ?Assistance Recommended at Discharge Frequent or constant Supervision/Assistance  ?Patient can return home with the following Two people to help with walking and/or transfers;Two people to help with bathing/dressing/bathroom;Direct supervision/assist for medications management;Help with stairs or ramp for entrance;Assist for transportation ?  ?Equipment Recommendations ? Other (comment)  ?  ?Recommendations for Other Services   ? ? ?  ?Precautions / Restrictions Precautions ?Precautions: Fall ?Restrictions ?Weight Bearing Restrictions: No  ?  ? ?  Mobility ? Bed Mobility ?Overal bed mobility: Needs Assistance ?Bed Mobility: Supine to Sit, Sit to Supine ?  ?  ?Supine to sit: Max assist, HOB elevated ?Sit to supine: Max assist, +2 for  physical assistance ?  ?General bed mobility comments: Required hand over hand and max VC's for sequencing LE's to EOB. maxA at trunk to attain sitting EOB ?Patient Response: Flat affect, Cooperative ? ?Transfers ?Overall transfer level: Needs assistance ?Equipment used: Rolling walker (2 wheels) ?Transfers: Sit to/from Stand ?Sit to Stand: Mod assist, +2 physical assistance ?  ?  ?  ?  ?  ?General transfer comment: Requires maxA for placement of RUE on RW. R lateral lean with frequent L knee buckling with tactile weight shift on shoulders and pelvis to midline. ?  ? ?Ambulation/Gait ?  ?  ?  ?  ?  ?  ?  ?General Gait Details: Unsafe to perform ? ? ?Stairs ?  ?  ?  ?  ?  ? ? ?Wheelchair Mobility ?  ? ?Modified Rankin (Stroke Patients Only) ?  ? ? ?  ?Balance Overall balance assessment: Needs assistance ?Sitting-balance support: Feet supported, Bilateral upper extremity supported ?Sitting balance-Leahy Scale: Poor ?Sitting balance - Comments: Posterior bias with UE supported. Able to tolerate upright sitting with close supervision for < 1 minute. ?Postural control: Posterior lean ?Standing balance support: Bilateral upper extremity supported, During functional activity, Reliant on assistive device for balance ?Standing balance-Leahy Scale: Poor ?Standing balance comment: R lateral lean in standing with post bias due to inability to anteriorly weight shift in standing over BOS. ?  ?  ?  ?  ?  ?  ?  ?  ?  ?  ?  ?  ? ?  ?Cognition Arousal/Alertness: Awake/alert ?Behavior During Therapy: [facility] for tasks assessed/performed ?Overall Cognitive Status: History of cognitive impairments - at baseline ?  ?  ?  ?  ?  ?  ?  ?  ?  ?  ?  ?  ?  ?  ?  ?  ?General Comments: Requiring max hand over hand cuing for command following and increased time. Very pleasant. ?  ?  ? ?  ?Exercises   ? ?  ?General Comments   ?  ?  ? ?Pertinent Vitals/Pain Pain Assessment ?Pain Assessment: No/denies pain  ? ? ?Home Living   ?  ?  ?  ?  ?  ?  ?  ?  ?   ?   ?  ?Prior Function    ?  ?  ?   ? ?PT Goals (current goals can now be found in the care plan section) Acute Rehab PT Goals ?Patient Stated Goal: to go home ?PT Goal Formulation: With patient/family ?Time For Goal Achievement: 07/03/21 ?Potential to Achieve Goals: Poor ?Progress towards PT goals: Not progressing toward goals - comment ? ?  ?Frequency ? ? ? Min 2X/week ? ? ? ?  ?PT Plan Current plan remains appropriate  ? ? ?Co-evaluation PT/OT/SLP Co-Evaluation/Treatment: Yes ?Reason for Co-Treatment: Complexity of the patient's impairments (multi-system involvement);Necessary to address cognition/behavior during functional activity;For patient/therapist safety;To address functional/ADL transfers ?PT goals addressed during session: Mobility/safety with mobility;Proper use of DME ?OT goals addressed during session: Proper use of Adaptive equipment and DME ?  ? ?  ?AM-PAC PT "6 Clicks" Mobility   ?Outcome Measure ? Help needed turning from your back to your side while in a flat bed without using bedrails?: A Little ?Help needed moving from lying on  your back to sitting on the side of a flat bed without using bedrails?: Total ?Help needed moving to and from a bed to a chair (including a wheelchair)?: Total ?Help needed standing up from a chair using your arms (e.g., wheelchair or bedside chair)?: Total ?Help needed to walk in hospital room?: Total ?Help needed climbing 3-5 steps with a railing? : Total ?6 Click Score: 8 ? ?  ?End of Session Equipment Utilized During Treatment: Gait belt ?Activity Tolerance: Other (comment);Patient limited by fatigue (limited by cognition) ?Patient left: in bed;with call bell/phone within reach;with family/visitor present;with nursing/sitter in room (in care of OT) ?Nurse Communication: Mobility status ?PT Visit Diagnosis: Other abnormalities of gait and mobility (R26.89);Muscle weakness (generalized) (M62.81);History of falling (Z91.81);Difficulty in walking, not elsewhere  classified (R26.2);Unsteadiness on feet (R26.81) ?  ? ? ?Time: [id number] ?PT Time Calculation (min) (ACUTE ONLY): 17 min ? ?Charges:  $Therapeutic Activity: 8-22 mins          ?          ?Delphia GratesMilton M. Fairly IV,

## 2021-06-20 NOTE — Progress Notes (Signed)
?PROGRESS NOTE ? ? ? ?Robert Frye  [id number] DOB: 17-Nov-1931 DOA: 06/18/2021 ?PCP: Baxter Hire, MD  ? ? ?Brief Narrative:  ?88 y.o. male with medical history significant for A-fib on Eliquis s/p cardioversion 03/13/2020 and 06/11/2020, HFpEF (EF 50 to 55% 05/2020), chronically elevated troponin with intermediate risk Lexiscan 03/2020, CKD 3B, dementia, hypothyroidism, HTN, chronic dyspnea with history of pleural effusion s/p thoracentesis 10/2020 who presents to the ED with a 1 week history of nonproductive cough, dizziness and progressive weakness, resulting in a fall on the morning of arrival.  Patient apparently went to use the bathroom and stumbled falling in a sitting position onto the commode. He did not get hurt. Wife who gives most of the history denies noticing slurred speech or facial droop or change in mentation. She did say his legs appeared weak. Patient saw his cardiologist on the morning of arrival and they were referred to the emergency room for further evaluation. ? ?Initially there was concern for community-acquired pneumonia.  This is felt unlikely.  Procalcitonin negative.  Patient afebrile.  No white count.  Antibiotics have been discontinued.  Presentation more consistent with mild exacerbation of underlying heart failure. ? ? ?Assessment & Plan: ?  ?Principal Problem: ?  Weakness ?Active Problems: ?  CAP (community acquired pneumonia) ?  Elevated troponin ?  Fall at home, initial encounter ?  Recurrent pleural effusion on left ?  Paroxysmal atrial fibrillation (HCC) ?  Chronic anticoagulation ?  (HFpEF) heart failure with preserved ejection fraction ([place]) ?  Essential hypertension ?  Stage 3b chronic kidney disease ([place]) ?  Dementia without behavioral disturbance ([place]) ?  Hypothyroidism ?  Acute decompensated heart failure ([place]) ? ?Acute decompensated diastolic congestive heart failure with  ?Pulm HTN ?Patient is known to [facility] cardiology ?Case discussed with Christell Faith, PA.  Saw primary  cardiologist in office 3/27 ?Currently no indication for inpatient cardiology consult ?Plan: ?--cont IV lasix 40 mg daily ? ?Moderate left-sided pleural effusion ?Hx of recurrent pleural effusion ?--likely secondary to above ?--s/p thoracentesis with 1L removed ?Plan: ?--cont IV lasix 40 mg daily ? ?Elevated troponin ?Patient with chronic elevation ?Saw primary cardiologist in office 3/27 ?Currently no indication for inpatient cardiology consult ?Can notify [facility] if any inpatient issues arise ? ?Paroxysmal atrial fibrillation ?Status post DC cardioversion twice in the past ?Currently heart rate stable ?Continue amiodarone ?Apixaban on hold pending thoracentesis ?--resume home Eliquis today ? ?Community-acquired pneumonia, ruled out ?No obvious infiltrate on chest x-ray ?Procalcitonin negative, patient afebrile, no white count ?Discontinued antibiotics ? ?Weakness ?Right arm weakness ?Initial CT head with no evidence of acute injury.  Fall appears to be low impact onto the commode ?No evidence of neurologic deficit at that time ?--today, the weakness was more profound in the right arm ?Plan: ?--MRI brain  ? ?Hypothyroidism ?--cont Synthroid ? ?Dementia without behavioral disturbance ?Delirium precautions ? ?Stage IIIb chronic kidney disease ?Creatinine at or near baseline ?Monitor while on IV diuretics ? ?Essential hypertension ?IV Lasix as above ? ? ?DVT prophylaxis: Eliquis ?Code Status: DNR ?Family Communication: wife and son updated at bedside today ? ?Disposition Plan: Status is: Inpatient ?Pending MRI brain  ? ? ?Level of care: Med-Surg ? ?Consultants:  ?None ? ?Procedures:  ?Ultrasound-guided thoracentesis 3/28 ? ?Antimicrobials: ?None (Rocephin and azithromycin discontinued) ? ? ?Subjective: ?It was discovered today that pt can't lift his right arm.  Family also noted pt having difficulty swallowing food (but ok with liquids). ? ? ?Physical Exam: ? ?Constitutional: NAD,  alert, not oriented ?HEENT: conjunctivae  and lids normal, EOMI ?CV: No cyanosis.   ?RESP: normal respiratory effort, on RA ?SKIN: warm, dry ?Neuro: right arm not anti-gravity, no grip strength with right hand ? ? ?Data Reviewed: I have personally reviewed following labs and imaging studies ? ?CBC: ?Recent Labs  ?Lab 06/18/21 ?1427 06/18/21 ?1710 06/19/21 ?0231  ?WBC 10.6 10.6* 11.1*  ?NEUTROABS 8.7*  --   --   ?HGB 14.1 14.1 12.7*  ?HCT 40.9 44.0 39.1  ?MCV 98* 103.3* 102.1*  ?PLT 296 299 261  ? ?Basic Metabolic Panel: ?Recent Labs  ?Lab 06/18/21 ?1427 06/18/21 ?1710 06/18/21 ?1756 06/19/21 ?0231  ?NA 145* 140  --  141  ?K 4.1 3.9  --  3.8  ?CL 104 103  --  108  ?CO2 22 25  --  25  ?GLUCOSE 139* 96  --  97  ?BUN 26 27*  --  24*  ?CREATININE 1.85* 1.83*  --  1.62*  ?CALCIUM 9.9 9.5  --  8.8*  ?MG  --   --  2.4  --   ? ?GFR: ?Estimated Creatinine Clearance: 26.8 mL/min (A) (by C-G formula based on SCr of 1.62 mg/dL (H)). ?Liver Function Tests: ?No results for input(s): AST, ALT, ALKPHOS, BILITOT, PROT, ALBUMIN in the last 168 hours. ?No results for input(s): LIPASE, AMYLASE in the last 168 hours. ?No results for input(s): AMMONIA in the last 168 hours. ?Coagulation Profile: ?No results for input(s): INR, PROTIME in the last 168 hours. ?Cardiac Enzymes: ?No results for input(s): CKTOTAL, CKMB, CKMBINDEX, TROPONINI in the last 168 hours. ?BNP (last 3 results) ?No results for input(s): PROBNP in the last 8760 hours. ?HbA1C: ?No results for input(s): HGBA1C in the last 72 hours. ?CBG: ?No results for input(s): GLUCAP in the last 168 hours. ?Lipid Profile: ?No results for input(s): CHOL, HDL, LDLCALC, TRIG, CHOLHDL, LDLDIRECT in the last 72 hours. ?Thyroid Function Tests: ?No results for input(s): TSH, T4TOTAL, FREET4, T3FREE, THYROIDAB in the last 72 hours. ?Anemia Panel: ?No results for input(s): VITAMINB12, FOLATE, FERRITIN, TIBC, IRON, RETICCTPCT in the last 72 hours. ?Sepsis Labs: ?Recent Labs  ?Lab 06/18/21 ?1952  ?PROCALCITON <0.10  ? ? ?Recent Results  (from the past 240 hour(s))  ?Body fluid culture w Gram Stain     Status: None (Preliminary result)  ? Collection Time: 06/19/21 10:00 AM  ? Specimen: PATH Cytology Pleural fluid  ?Result Value Ref Range Status  ? Specimen Description   Final  ?  PLEURAL ?Performed at [facility], [address], [place], [place] [address] ?  ? Special Requests   Final  ?  PLEURAL ?Performed at [facility], [address] [place], [place] [address] ?  ? Gram Stain   Final  ?  WBC PRESENT,BOTH PMN AND MONONUCLEAR ?NO ORGANISMS SEEN ?CYTOSPIN SMEAR ?  ? Culture   Final  ?  NO GROWTH < 24 HOURS ?Performed at [facility], [place] [address], [place], [place] [address] ?  ? Report Status PENDING  Incomplete  ?  ? ? ? ? ? ?Radiology Studies: ?DG Chest Port 1 View ? ?Result Date: 06/19/2021 ?CLINICAL DATA:  Status post left thoracentesis. EXAM: PORTABLE CHEST 1 VIEW COMPARISON:  Chest radiograph dated June 18, 2021 FINDINGS: The heart is mildly enlarged. Atherosclerotic calcification of the aortic arch. Interval decrease in the left pleural effusion. No appreciable pneumothorax. No acute osseous abnormality. Thoracic spondylosis. IMPRESSION: Status post thoracentesis with interval significant decrease in size of the  left pleural effusion. No appreciable pneumothorax. Stable cardiomegaly. Electronically Signed   By: Keane Police D.O.   On: 06/19/2021 10:30  ? ?US THORACENTESIS ASP PLEURAL SPACE W/IMG GUIDE ? ?Result Date: 06/19/2021 ?INDICATION: Shortness of breath with recurrent left pleural effusion. EXAM: ULTRASOUND GUIDED LEFT THORACENTESIS MEDICATIONS: Local 1% lidocaine only. COMPLICATIONS: None immediate. PROCEDURE: An ultrasound guided thoracentesis was thoroughly discussed with the patient and questions answered. The benefits, risks, alternatives and complications were also discussed. The patient understands and wishes to proceed with the procedure. Written consent was obtained. Ultrasound was performed to  localize and mark an adequate pocket of fluid in the left chest. The area was then prepped and draped in the normal sterile fashion. 1% Lidocaine was used for local anesthesia. Under ultrasound guidance

## 2021-06-20 NOTE — Consult Note (Signed)
? ?  Heart Failure Nurse Navigator Note ? ?HFpEF 50-55%.  Severe concentric LVH.  Left ventricular diastolic parameters are indeterminate.  By echocardiogram performed May 25, 2020. ? ?He presented from home due to weakness and a fall. ? ?Comorbidities: ? ?Atrial fibrillation ?Dementia ?Hypothyroidism ?Hypertension ?Chronic kidney disease ?Hyperlipidemia ? ?Medications: ? ?Amiodarone 100 mg daily ?Atorvastatin 10 mg daily ?Furosemide 40 mg IV daily ?Levothyroxine 75 mcg daily ?Potassium chloride 20 mEq daily ? ?Labs: ? ?Sodium 141, potassium 3.8, chloride 108, 6 CO2 25, BUN 24, creatinine 1.62, estimated GFR 41, BNP 1384, 6 white blood count 11.1, hemoglobin 12.7, hematocrit 39, platelet count 261. ?Weight is 60.1 kg ?Blood pressure 111/61 ?Input 2705 mL ?Output not documented ? ? ? ?Initial meeting with patient and wife on this admission.  Patient is lying quietly in bed on room air, in no acute distress.  He earlier had undergone thoracentesis on the left side, with 1000 mL of yellow fluid removed. ? ?Wife states at home that she had seen his weight go up a pound and a half.  He had not been drinking a lot of liquids and felt that he had been eating well. ? ?Discussed follow-up in the outpatient heart failure clinic on April 6 at 2:30 in the afternoon. ? ?Son who is at the bedside voices that he feels that his father should go to ask skilled facility.  Patient's wife does not feel comfortable with that.  Discussed having family meeting and talking it through.  She voices understanding. ? ?She states that she was able to find his hearing aid down in the emergency room and that it is currently charging. ? ?She also voices concern over his voice being so soft and that when he eats or drinks he coughs.  Consult for speech therapy has been ordered. ? ?Tresa Endo RN [facility] ?

## 2021-06-21 DIAGNOSIS — E440 Moderate protein-calorie malnutrition: Secondary | ICD-10-CM | POA: Insufficient documentation

## 2021-06-21 DIAGNOSIS — I639 Cerebral infarction, unspecified: Secondary | ICD-10-CM

## 2021-06-21 LAB — CBC
HCT: 45.8 % (ref 39.0–52.0)
Hemoglobin: 15.2 g/dL (ref 13.0–17.0)
MCH: 33.4 pg (ref 26.0–34.0)
MCHC: 33.2 g/dL (ref 30.0–36.0)
MCV: 100.7 fL — ABNORMAL HIGH (ref 80.0–100.0)
Platelets: 258 10*3/uL (ref 150–400)
RBC: 4.55 MIL/uL (ref 4.22–5.81)
RDW: 14 % (ref 11.5–15.5)
WBC: 12.5 10*3/uL — ABNORMAL HIGH (ref 4.0–10.5)
nRBC: 0 % (ref 0.0–0.2)

## 2021-06-21 LAB — BASIC METABOLIC PANEL
Anion gap: 12 (ref 5–15)
BUN: 25 mg/dL — ABNORMAL HIGH (ref 8–23)
CO2: 26 mmol/L (ref 22–32)
Calcium: 9.1 mg/dL (ref 8.9–10.3)
Chloride: 101 mmol/L (ref 98–111)
Creatinine, Ser: 1.57 mg/dL — ABNORMAL HIGH (ref 0.61–1.24)
GFR, Estimated: 42 mL/min — ABNORMAL LOW (ref >60)
Glucose, Bld: 95 mg/dL (ref 70–99)
Potassium: 3.2 mmol/L — ABNORMAL LOW (ref 3.5–5.1)
Sodium: 139 mmol/L (ref 135–145)

## 2021-06-21 LAB — MAGNESIUM: Magnesium: 2 mg/dL (ref 1.7–2.4)

## 2021-06-21 MED ORDER — HEPARIN SODIUM (PORCINE) 5000 UNIT/ML IJ SOLN
5000.0000 [IU] | Freq: Three times a day (TID) | INTRAMUSCULAR | Status: DC
  Administered 2021-06-21 – 2021-06-22 (×2): 5000 [IU] via SUBCUTANEOUS
  Filled 2021-06-21 (×2): qty 1

## 2021-06-21 MED ORDER — ASPIRIN EC 325 MG PO TBEC
325.0000 mg | DELAYED_RELEASE_TABLET | Freq: Every day | ORAL | Status: DC
Start: 2021-06-21 — End: 2021-06-22
  Administered 2021-06-21 – 2021-06-22 (×2): 325 mg via ORAL
  Filled 2021-06-21 (×2): qty 1

## 2021-06-21 MED ORDER — POTASSIUM CHLORIDE 20 MEQ PO PACK
40.0000 meq | PACK | Freq: Once | ORAL | Status: AC
  Administered 2021-06-21: 40 meq via ORAL
  Filled 2021-06-21: qty 2

## 2021-06-21 MED ORDER — ENSURE ENLIVE PO LIQD
237.0000 mL | Freq: Three times a day (TID) | ORAL | Status: DC
  Administered 2021-06-21 (×2): 237 mL via ORAL

## 2021-06-21 MED ORDER — STROKE: EARLY STAGES OF RECOVERY BOOK: Freq: Once | Status: AC

## 2021-06-21 MED ORDER — ADULT MULTIVITAMIN W/MINERALS CH
1.0000 | ORAL_TABLET | Freq: Every day | ORAL | Status: DC
  Administered 2021-06-21 – 2021-06-22 (×2): 1 via ORAL
  Filled 2021-06-21 (×2): qty 1

## 2021-06-21 NOTE — Evaluation (Signed)
Occupational Therapy RE-Evaluation ?Patient Details ?Name: Robert Frye ?MRN: [id number] ?DOB: 1932/01/09 ?Today's Date: 06/21/2021 ? ? ?History of Present Illness 88 y.o. male with medical history significant for A-fib on Eliquis s/p cardioversion 03/13/2020 and 06/11/2020, HFpEF (EF 50 to 55% 05/2020), chronically elevated troponin with intermediate risk Lexiscan 03/2020, CKD 3B, dementia, hypothyroidism, HTN, chronic dyspnea with history of pleural effusion s/p thoracentesis 10/2020 who presents to the ED with a 1 week history of nonproductive cough, dizziness and progressive weakness, resulting in a fall on the morning of arrival.  Patient apparently went to use the bathroom and stumbled falling in a sitting position onto the commode. He did not get hurt. Wife who gives most of the history denies noticing slurred speech or facial droop or change in mentation. She did say his legs appeared weak. Patient saw his cardiologist on the morning of arrival and they were referred to the emergency room for further evaluation.  ED course: Vitals within normal limits on arrival.  Blood work significant for troponin of 136 (baseline in the low 100s), creatinine 1.83 which is around his baseline, WBC 10.6 with hemoglobin 14.1.  Urinalysis unremarkable.  Procalcitonin, COVID and flu pending.  EKG, personally viewed and interpreted: Sinus at 61 with nonspecific ST-T wave changes.  C-spine CT nonacute but did show large left pleural effusion.  Chest x-ray also shows moderate left pleural effusion. NEW MRI with results of acute ischemic L basal ganglia infarct.  ? ?Clinical Impression ?  ?Upon entering the room, pt supine in bed with RN and wife present in the room. Pt seen for re-evaluation after new acute infarct. Pt initially lethargic and use of lemon swab to alert further. Pt needing mod - max A to EOB. Pt needing min guard to sit EOB initially but as pt fatigued he began pushing towards the R with mod A needed for midline  orientation. Pt standing with max A but does initiate with increased time. Pt returning to bed secondary to fatigue at the end of this session. Wife present in room observing and asking questions with therapist further educating her on recommendation for SNF at discharge.  ?   ? ?Recommendations for follow up therapy are one component of a multi-disciplinary discharge planning process, led by the attending physician.  Recommendations may be updated based on patient status, additional functional criteria and insurance authorization.  ? ?Follow Up Recommendations ? Skilled nursing-short term rehab (<3 hours/day)  ?  ?Assistance Recommended at Discharge Frequent or constant Supervision/Assistance  ?Patient can return home with the following A lot of help with walking and/or transfers;A lot of help with bathing/dressing/bathroom;Assistance with cooking/housework;Help with stairs or ramp for entrance;Assist for transportation;Direct supervision/assist for financial management;Direct supervision/assist for medications management ? ?  ?   ?Equipment Recommendations ? Other (comment) (defer to next venue of care)  ?  ?   ?Precautions / Restrictions Precautions ?Precautions: Fall ?Restrictions ?Weight Bearing Restrictions: No  ? ?  ? ?Mobility Bed Mobility ?Overal bed mobility: Needs Assistance ?Bed Mobility: Supine to Sit, Sit to Supine ?  ?  ?Supine to sit: Max assist ?Sit to supine: Total assist ?  ?General bed mobility comments: Pt moves L LE over EOB but assist for all other aspects. ?  ? ?Transfers ?Overall transfer level: Needs assistance ?Equipment used: 1 person hand held assist ?Transfers: Sit to/from Stand ?Sit to Stand: Max assist ?  ?  ?  ?  ?  ?General transfer comment: R knee blocked ?  ? ?  ?  Balance Overall balance assessment: Needs assistance ?Sitting-balance support: Feet supported, Bilateral upper extremity supported ?Sitting balance-Leahy Scale: Poor ?Sitting balance - Comments: min - mod A static sitting  balance ?Postural control: Posterior lean, Right lateral lean ?Standing balance support: Bilateral upper extremity supported, During functional activity, Reliant on assistive device for balance ?Standing balance-Leahy Scale: Zero ?  ?  ?  ?  ?  ?  ?  ?  ?  ?  ?  ?  ?   ? ?ADL either performed or assessed with clinical judgement  ? ? ? ? ?   ?   ?Perception Perception ?Perception: Impaired ?Inattention/Neglect: Does not attend to right side of body ?  ?   ? ?Pertinent Vitals/Pain Pain Assessment ?Pain Assessment: No/denies pain  ? ? ? ?   ?Extremity/Trunk Assessment Upper Extremity Assessment ?RUE Deficits / Details: R UE with 2-/5 strength, WFLs for PROM ?RUE Coordination: decreased fine motor;decreased gross motor ?  ?Lower Extremity Assessment ?Lower Extremity Assessment: Defer to PT evaluation ?  ?  ?  ?   ?Cognition Arousal/Alertness: Awake/alert, Lethargic ?Behavior During Therapy: [facility] for tasks assessed/performed ?Overall Cognitive Status: History of cognitive impairments - at baseline ?  ?  ?  ?  ?  ?  ?  ?  ?  ?  ?  ?  ?  ?  ?  ?  ?General Comments: increased time to process with mod -max multimodal cuing ?  ?  ?   ?   ?   ? ? ?   ?   ? ?  ?  ?   ?   ?   ?OT Goals(Current goals can be found in the care plan section) Acute Rehab OT Goals ?Patient Stated Goal: to get stronger and go home ?OT Goal Formulation: With patient/family ?Time For Goal Achievement: 07/03/21 ?Potential to Achieve Goals: Fair  ?OT Frequency: Min 2X/week ?  ? ?   ?AM-PAC OT "6 Clicks" Daily Activity     ?Outcome Measure Help from another person eating meals?: A Little ?Help from another person taking care of personal grooming?: A Lot ?Help from another person toileting, which includes using toliet, bedpan, or urinal?: Total ?Help from another person bathing (including washing, rinsing, drying)?: A Lot ?Help from another person to put on and taking off regular upper body clothing?: A Lot ?Help from another person to put on and taking off  regular lower body clothing?: Total ?6 Click Score: 11 ?  ?End of Session Nurse Communication: Mobility status ? ?Activity Tolerance: Patient tolerated treatment well ?Patient left: in bed;with call bell/phone within reach ? ?OT Visit Diagnosis: Unsteadiness on feet (R26.81);Repeated falls (R29.6);Muscle weakness (generalized) (M62.81)  ?              ?Time: [id number] ?OT Time Calculation (min): 22 min ?Charges:  OT General Charges ?$OT Visit: 1 Visit ?OT Evaluation ?$OT Re-eval: 1 Re-eval ?OT Treatments ?$Neuromuscular Re-education: 8-22 mins ? ?Darleen Crocker, MS, OTR/L , CBIS ?ascom [phone]  ?06/21/21, 4:18 PM  ? ?

## 2021-06-21 NOTE — Progress Notes (Signed)
Initial Nutrition Assessment ? ?DOCUMENTATION CODES:  ? ?Non-severe (moderate) malnutrition in context of chronic illness ? ?INTERVENTION:  ? ?-Ensure Enlive po TID, each supplement provides 350 kcal and 20 grams of protein.  ?-MVI with minerals daily ?-Liberalize diet to regular for widest variety of meal selections ? ?NUTRITION DIAGNOSIS:  ? ?Moderate Malnutrition related to chronic illness (dementia) as evidenced by mild fat depletion, moderate fat depletion, mild muscle depletion, moderate muscle depletion. ? ?GOAL:  ? ?Patient will meet greater than or equal to 90% of their needs ? ?MONITOR:  ? ?PO intake, Supplement acceptance, Labs, Weight trends, Skin, I & O's ? ?REASON FOR ASSESSMENT:  ? ?Rounds ?  ? ?ASSESSMENT:  ? ?88 y.o. male with medical history significant for A-fib on Eliquis s/p cardioversion 03/13/2020 and 06/11/2020, HFpEF (EF 50 to 55% 05/2020), chronically elevated troponin with intermediate risk Lexiscan 03/2020, CKD 3B, dementia, hypothyroidism, HTN, chronic dyspnea with history of pleural effusion s/p thoracentesis 10/2020 who presents to the ED with a 1 week history of nonproductive cough, dizziness and progressive weakness, resulting in a fall on the morning of arrival.  Patient apparently went to use the bathroom and stumbled falling in a sitting position onto the commode. He did not get hurt. Wife who gives most of the history denies noticing slurred speech or facial droop or change in mentation. She did say his legs appeared weak. Patient saw his cardiologist on the morning of arrival and they were referred to the emergency room for further evaluation. ? ?Pt admitted with CAP and recurrent rt pleural effusion.  ? ?3/28- s/p lt thoracentesis (1 L removed) ? ?Reviewed I/O's: -1.6 L x 24 hours and +1.2 L since admission ? ?UOP: 1.7 L x 24 hours ? ?Case discussed with SLP, who plans to re-evaluate pt today. She is concerned about pt's ability to take in adequate PO's.  ? ?Pt very lethargic at  time of visit. He did not arouse to voice or touch. Spoke with pt's son at bedside. He reports that pt typically has a good appetite, consuming 3 meals per day (Breakfast: coffee and muffin; Lunch: snack food; Dinner: take out meal). Per son, pt has been too lethargic to eat today.  ?  ?Pt som unsure if pt has lost weight. Reviewed wt hx; pt has experienced a 7.4% wt loss over the past 3 months. While this is not significant for time frame, it is concerning given advanced age and multiple co-morbidities.  ? ?Per MD notes, pt complains of dysphagia to solids, but is drinking liquids.  ? ?Medications reviewed and include lasix, sodium chloride, and vitamin B-12. ? ?Labs reviewed: K: 3.2.  ? ?NUTRITION - FOCUSED PHYSICAL EXAM: ? ?Flowsheet Row Most Recent Value  ?Orbital Region No depletion  ?Upper Arm Region Moderate depletion  ?Thoracic and Lumbar Region No depletion  ?Buccal Region Mild depletion  ?Temple Region Mild depletion  ?Clavicle Bone Region Moderate depletion  ?Clavicle and Acromion Bone Region Moderate depletion  ?Scapular Bone Region Moderate depletion  ?Dorsal Hand Mild depletion  ?Patellar Region Mild depletion  ?Anterior Thigh Region Mild depletion  ?Posterior Calf Region Mild depletion  ?Edema (RD Assessment) Mild  ?Hair Reviewed  ?Eyes Reviewed  ?Mouth Reviewed  ?Skin Reviewed  ?Nails Reviewed  ? ?  ? ? ?Diet Order:   ?Diet Order   ? ?       ?  Diet Heart Room service appropriate? Yes with Assist; Fluid consistency: Thin  Diet effective now       ?  ? ?  ?  ? ?  ? ? ?  EDUCATION NEEDS:  ? ?No education needs have been identified at this time ? ?Skin:  Skin Assessment: Skin Integrity Issues: ?Skin Integrity Issues:: Other (Comment) ?Other: skin tears to elbow, bilteral lower arms, and pretibial ? ?Last BM:  06/19/21 ? ?Height:  ? ?Ht Readings from Last 1 Encounters:  ?06/19/21 5\' 6"  (1.676 m)  ? ? ?Weight:  ? ?Wt Readings from Last 1 Encounters:  ?06/21/21 56.7 kg  ? ? ?Ideal Body Weight:  64.5  kg ? ?BMI:  Body mass index is 20.18 kg/m?. ? ?Estimated Nutritional Needs:  ? ?Kcal:  1700-1900 ? ?Protein:  80-95 grams ? ?Fluid:  > 1.7 L ? ? ? ?Loistine Chance, RD, LDN, CDCES ?Registered Dietitian II ?Certified Diabetes Care and Education Specialist ?Please refer to [facility] for RD and/or RD on-call/weekend/after hours pager  ?

## 2021-06-21 NOTE — Consult Note (Signed)
Neurology Consultation ?Reason for Consult: Right sided weakness ?Referring Physician: LAt, T ? ?CC: Weakness ? ?History is obtained from: Patient's wife ? ?HPI: Robert Frye is a 88 y.o. male with a history of dementia who presents with right-sided weakness.  His wife initially noticed some type of problem on Sunday, though it was not as bad as it became.  She initially felt like he was dragging his left leg, subsequently began dragging his right leg.  On Monday, she noticed his right arm was weak and he presented to the emergency department.  An MRI was obtained today which shows a subcortical infarct on the left. ? ?At baseline, his wife lays out his close but he is able to dress himself.  He is not able to cook for himself but he is able to feed himself.  He is able to stand up and get to the bathroom.  For showering, his wife has to get him into the shower, but then he is able to perform the duties inside the shower on his own. ? ?LKW: Sunday ?tpa given?: no, outside of window ? ? ?ROS: A 14 point ROS was performed and is negative except as noted in the HPI.  ?Past Medical History:  ?Diagnosis Date  ? (HFpEF) heart failure with preserved ejection fraction ([place])   ? CKD (chronic kidney disease), stage III ([place])   ? Dementia ([place])   ? History of stress test   ? a. 03/2020 MV: No ischemia/infarct. EF 40% (55-60% by echo 01/2020).  ? Hyperlipidemia   ? Hypertension   ? Hypertrophic cardiomyopathy ([place])   ? a. 01/2020 Echo: EF 55-60%, no rwma. Sev eccentric LVH of apical and basal-septal segments. gr1 DD. Mildly enlarged RV w/ nl RV fxn. sev dil LA, mod dil RA. Mild MR.  ? Hypothyroidism   ? Persistent atrial fibrillation ([place])   ? a. Dx 01/2020 s/p DCCV 02/2020; b. 02/2020 Recurrent Afib noted-->rate controlled-->amio started 05/2020 in setting of ongoing CHF; c. CHA2DS2VASc = 4-->Eliquis 2.5 BID.  ? ? ? ?Family History  ?Problem Relation Age of Onset  ? Heart attack Father   ? Heart disease Sister   ? Heart  disease Brother   ? ? ? ?Social History:  reports that he has quit smoking. His smoking use included cigarettes. He has never used smokeless tobacco. He reports that he does not currently use alcohol. He reports that he does not use drugs. ? ? ?Exam: ?Current vital signs: ?BP 115/78 (BP Location: Left Arm)   Pulse 70   Temp 97.9 ?F (36.6 ?C) (Oral)   Resp 17   Ht 5\' 6"  (1.676 m)   Wt 56.7 kg   SpO2 97%   BMI 20.18 kg/m?  ?Vital signs in last 24 hours: ?Temp:  [97.8 ?F (36.6 ?C)-98.6 ?F (37 ?C)] 97.9 ?F (36.6 ?C) (03/30 1716) ?Pulse Rate:  [70-77] 70 (03/30 1716) ?Resp:  [16-18] 17 (03/30 1716) ?BP: (115-138)/(56-78) 115/78 (03/30 1716) ?SpO2:  [93 %-97 %] 97 % (03/30 1716) ?Weight:  [56.7 kg] 56.7 kg (03/30 0500) ? ? ?Physical Exam  ?Constitutional: Appears well-developed and well-nourished.  ?Psych: Affect appropriate to situation ?Eyes: No scleral injection ?HENT: No OP obstruction ?MSK: no joint deformities.  ?Cardiovascular: Normal rate and regular rhythm.  ?Respiratory: Effort normal, non-labored breathing ?GI: Soft.  No distension. There is no tenderness.  ?Skin: WDI ? ?Neuro: ?Mental Status: ?Patient is easily aroused, he is able to tell me his name and identify his wife.  He  is not oriented. ?Cranial Nerves: ?II: Visual Fields are full. Pupils are equal, round, and reactive to light.   ?III,IV, VI: EOMI without ptosis or diploplia.  ?V: Facial sensation is symmetric to temperature ?VII: Facial movement with right-sided weakness ?VIII: hearing is intact to voice ?X: Uvula elevates symmetrically ?XI: Shoulder shrug is symmetric. ?XII: tongue is midline without atrophy or fasciculations.  ?Motor: ?Tone is normal. Bulk is normal. 5/5 strength was present on the left, he has no movement of the right upper extremity and minimal movement of the right lower extremity, unable to lift against gravity. ?Sensory: ?Sensation is symmetric to light touch and temperature in the arms and legs. ?Cerebellar: ?Unable to  perform on the right, no clear ataxia on the left ? ? ? ? ?I have reviewed labs in epic and the results pertinent to this consultation are: ?Creatinine 1.57 ? ?I have reviewed the images obtained: MRI brain-subcortical infarct on the right ? ?Impression: 88 year old male with a subcortical infarct on MRI.  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  He does have a history of atrial fibrillation, but given that the stroke is sizable even though due to lipohyalinosis, I would favor holding anti-coagulation for a week from onset of symptoms(Monday).  Given that he is already anticoagulated, it does not appear embolic, and he would be unlikely to have any type of intervention in the future, I do not think echo or CTA would change management and therefore will not pursue it at this time. I would optimize small vessel risk factors and resume anticoagulation at the appropriate time.  ? ?Recommendations: ?1) hold eliquis through Monday ?2) bridge with ASA 325mg  daily ?3) LDL, A1C ?4) PT, OT, ST ?5) Likely will need placement.  ? ?Roland Rack, MD ?[facility] ?[phone] ? ?If 7pm- 7am, please page neurology on call as listed in [place]. ? ?

## 2021-06-21 NOTE — Assessment & Plan Note (Deleted)
--  Pt presented with leg weakness and then right arm weakness.  MRI brain pos for 2.4 cm acute ischemic perforator type infarct involving the ?posterior left basal ganglia. ?Plan: ?--neuro consult today ?--hold home Eliquis ?

## 2021-06-21 NOTE — Plan of Care (Signed)
?  Problem: Respiratory: ?Goal: Ability to maintain adequate ventilation will improve ?Outcome: Progressing ?Goal: Ability to maintain a clear airway will improve ?Outcome: Progressing ?  ?Problem: Clinical Measurements: ?Goal: Ability to maintain clinical measurements within normal limits will improve ?Outcome: Progressing ?Goal: Will remain free from infection ?Outcome: Progressing ?Goal: Respiratory complications will improve ?Outcome: Progressing ?Goal: Cardiovascular complication will be avoided ?Outcome: Progressing ?  ?Problem: Nutrition: ?Goal: Adequate nutrition will be maintained ?Outcome: Progressing ?  ?Problem: Coping: ?Goal: Level of anxiety will decrease ?Outcome: Progressing ?  ?Problem: Elimination: ?Goal: Will not experience complications related to bowel motility ?Outcome: Progressing ?Goal: Will not experience complications related to urinary retention ?Outcome: Progressing ?  ?Problem: Pain Managment: ?Goal: General experience of comfort will improve ?Outcome: Progressing ?  ?Problem: Safety: ?Goal: Ability to remain free from injury will improve ?Outcome: Progressing ?  ?Problem: Skin Integrity: ?Goal: Risk for impaired skin integrity will decrease ?Outcome: Progressing ?  ?Problem: Activity: ?Goal: Ability to tolerate increased activity will improve ?Outcome: Not Progressing ?  ?Problem: Clinical Measurements: ?Goal: Ability to maintain a body temperature in the normal range will improve ?Outcome: Not Progressing ?  ?Problem: Education: ?Goal: Knowledge of General Education information will improve ?Description: Including pain rating scale, medication(s)/side effects and non-pharmacologic comfort measures ?Outcome: Not Progressing ?  ?Problem: Health Behavior/Discharge Planning: ?Goal: Ability to manage health-related needs will improve ?Outcome: Not Progressing ?  ?Problem: Clinical Measurements: ?Goal: Diagnostic test results will improve ?Outcome: Not Progressing ?  ?Problem:  Activity: ?Goal: Risk for activity intolerance will decrease ?Outcome: Not Progressing ?  ?

## 2021-06-21 NOTE — Progress Notes (Signed)
?PROGRESS NOTE ? ? ? ?Robert Frye  [id number] DOB: [birth year] DOA: 06/18/2021 ?PCP: Baxter Hire, MD  ? ? ?Brief Narrative:  ?88 y.o. male with medical history significant for A-fib on Eliquis s/p cardioversion 03/13/2020 and 06/11/2020, HFpEF (EF 50 to 55% 05/2020), chronically elevated troponin with intermediate risk Lexiscan 03/2020, CKD 3B, dementia, hypothyroidism, HTN, chronic dyspnea with history of pleural effusion s/p thoracentesis 10/2020 who presents to the ED with a 1 week history of nonproductive cough, dizziness and progressive weakness, resulting in a fall on the morning of arrival.  Patient apparently went to use the bathroom and stumbled falling in a sitting position onto the commode. He did not get hurt. Wife who gives most of the history denies noticing slurred speech or facial droop or change in mentation. She did say his legs appeared weak. Patient saw his cardiologist on the morning of arrival and they were referred to the emergency room for further evaluation. ? ?Initially there was concern for community-acquired pneumonia.  This is felt unlikely.  Procalcitonin negative.  Patient afebrile.  No white count.  Antibiotics have been discontinued.  Presentation more consistent with mild exacerbation of underlying heart failure. ? ? ?Assessment & Plan: ?  ?Principal Problem: ?  Weakness ?Active Problems: ?  CAP (community acquired pneumonia) ?  Elevated troponin ?  Fall at home, initial encounter ?  Recurrent pleural effusion on left ?  Paroxysmal atrial fibrillation (HCC) ?  Chronic anticoagulation ?  (HFpEF) heart failure with preserved ejection fraction ([place]) ?  Essential hypertension ?  Stage 3b chronic kidney disease ([place]) ?  Dementia without behavioral disturbance ([place]) ?  Hypothyroidism ?  Acute decompensated heart failure ([place]) ?  Malnutrition of moderate degree ? ? ?# Acute ischemic stroke ?--Pt presented with leg weakness and then right arm weakness.  MRI brain pos for 2.4 cm acute  ischemic perforator type infarct involving the ?posterior left basal ganglia. ?Plan: ?--neuro consult today ?--hold home Eliquis ? ?Acute decompensated diastolic congestive heart failure with  ?Pulm HTN ?Patient is known to [facility] cardiology ?Case discussed with Christell Faith, PA.  Saw primary cardiologist in office 3/27 ?Currently no indication for inpatient cardiology consult ?Plan: ?--cont IV lasix 40 mg daily ? ?Moderate left-sided pleural effusion ?Hx of recurrent pleural effusion ?--likely secondary to above ?--s/p thoracentesis with 1L removed ?Plan: ?--cont IV lasix 40 mg daily ? ?Elevated troponin ?Patient with chronic elevation ?Saw primary cardiologist in office 3/27 ?Currently no indication for inpatient cardiology consult ?Can notify [facility] if any inpatient issues arise ? ?Paroxysmal atrial fibrillation ?Status post DC cardioversion twice in the past ?Currently heart rate stable ?Continue amiodarone ?Apixaban on hold pending thoracentesis ?--hold home eliquis given sizable stroke ? ?Community-acquired pneumonia, ruled out ?No obvious infiltrate on chest x-ray ?Procalcitonin negative, patient afebrile, no white count ?Discontinued antibiotics ? ?Weakness ?Right arm weakness ?Initial CT head with no evidence of acute injury.  Subsequent MRI brain found acute stroke. ? ?Hypothyroidism ?--cont Synthroid ? ?Dementia without behavioral disturbance ?Delirium precautions ? ?Stage IIIb chronic kidney disease ?Creatinine at or near baseline ?Monitor while on IV diuretics ? ?Essential hypertension ?IV Lasix as above ? ? ?DVT prophylaxis: Heparin SQ ?Code Status: DNR  ?Family Communication: wife and son updated at bedside today ?Status is: inpatient ?Dispo:   ?The patient is from: home ?Anticipated d/c is to: SNF  ?Anticipated d/c date is: whenever bed available ? ? ?Level of care: Med-Surg ? ?Consultants:  ?None ? ?Procedures:  ?Ultrasound-guided thoracentesis 3/28 ? ?  Antimicrobials: ?None (Rocephin and azithromycin  discontinued) ? ? ?Subjective: ?MRI brain pos for 2.4 cm acute ischemic perforator type infarct involving the ?posterior left basal ganglia.  ? ?Pt continued to have poor oral intake, would chew his foods and then spit them out. ? ? ?Physical Exam: ? ?Constitutional: NAD, sleeping ?CV: No cyanosis.   ?RESP: normal respiratory effort, on RA ?Extremities: No effusions, edema in BLE ?SKIN: warm, dry ? ? ?Data Reviewed: I have personally reviewed following labs and imaging studies ? ?CBC: ?Recent Labs  ?Lab 06/18/21 ?1427 06/18/21 ?1710 06/19/21 ?0231 06/21/21 ?[id number]  ?WBC 10.6 10.6* 11.1* 12.5*  ?NEUTROABS 8.7*  --   --   --   ?HGB 14.1 14.1 12.7* 15.2  ?HCT 40.9 44.0 39.1 45.8  ?MCV 98* 103.3* 102.1* 100.7*  ?PLT 296 299 261 258  ? ?Basic Metabolic Panel: ?Recent Labs  ?Lab 06/18/21 ?1427 06/18/21 ?1710 06/18/21 ?1756 06/19/21 ?0231 06/21/21 ?[id number]  ?NA 145* 140  --  141 139  ?K 4.1 3.9  --  3.8 3.2*  ?CL 104 103  --  108 101  ?CO2 22 25  --  25 26  ?GLUCOSE 139* 96  --  97 95  ?BUN 26 27*  --  24* 25*  ?CREATININE 1.85* 1.83*  --  1.62* 1.57*  ?CALCIUM 9.9 9.5  --  8.8* 9.1  ?MG  --   --  2.4  --  2.0  ? ?GFR: ?Estimated Creatinine Clearance: 26.1 mL/min (A) (by C-G formula based on SCr of 1.57 mg/dL (H)). ?Liver Function Tests: ?No results for input(s): AST, ALT, ALKPHOS, BILITOT, PROT, ALBUMIN in the last 168 hours. ?No results for input(s): LIPASE, AMYLASE in the last 168 hours. ?No results for input(s): AMMONIA in the last 168 hours. ?Coagulation Profile: ?No results for input(s): INR, PROTIME in the last 168 hours. ?Cardiac Enzymes: ?No results for input(s): CKTOTAL, CKMB, CKMBINDEX, TROPONINI in the last 168 hours. ?BNP (last 3 results) ?No results for input(s): PROBNP in the last 8760 hours. ?HbA1C: ?No results for input(s): HGBA1C in the last 72 hours. ?CBG: ?No results for input(s): GLUCAP in the last 168 hours. ?Lipid Profile: ?No results for input(s): CHOL, HDL, LDLCALC, TRIG, CHOLHDL, LDLDIRECT in the last  72 hours. ?Thyroid Function Tests: ?No results for input(s): TSH, T4TOTAL, FREET4, T3FREE, THYROIDAB in the last 72 hours. ?Anemia Panel: ?No results for input(s): VITAMINB12, FOLATE, FERRITIN, TIBC, IRON, RETICCTPCT in the last 72 hours. ?Sepsis Labs: ?Recent Labs  ?Lab 06/18/21 ?1952  ?PROCALCITON <0.10  ? ? ?Recent Results (from the past 240 hour(s))  ?Body fluid culture w Gram Stain     Status: None (Preliminary result)  ? Collection Time: 06/19/21 10:00 AM  ? Specimen: PATH Cytology Pleural fluid  ?Result Value Ref Range Status  ? Specimen Description   Final  ?  PLEURAL ?Performed at [facility], [address], [place], [place] [address] ?  ? Special Requests   Final  ?  PLEURAL ?Performed at [facility], [address] [place], [place] [address] ?  ? Gram Stain   Final  ?  WBC PRESENT,BOTH PMN AND MONONUCLEAR ?NO ORGANISMS SEEN ?CYTOSPIN SMEAR ?  ? Culture   Final  ?  NO GROWTH 2 DAYS ?Performed at [facility], [address], [place],  [address] ?  ? Report Status PENDING  Incomplete  ?  ? ? ? ? ? ?Radiology Studies: ?MR BRAIN WO CONTRAST ? ?Result Date: 06/20/2021 ?CLINICAL DATA:  Initial evaluation for neuro deficit, stroke suspected. EXAM: MRI HEAD WITHOUT CONTRAST TECHNIQUE: Multiplanar, multiecho pulse sequences of the brain and surrounding structures were obtained without intravenous contrast. COMPARISON:  Prior CT from 06/18/2021. FINDINGS: Brain: Examination degraded by motion artifact. Generalized age-related cerebral atrophy. Patchy and confluent T2/FLAIR hyperintensity involving the periventricular and deep white matter both cerebral hemispheres most consistent with chronic small vessel ischemic disease, mild for age. Approximate 2.4 cm focus of restricted diffusion seen involving the posterior left basal ganglia, consistent with an acute perforator type infarct (series 9, image 27). No associated hemorrhage or mass effect. No other evidence for acute or  subacute ischemia. No encephalomalacia suggest chronic cortical infarction elsewhere within the brain. No acute or chronic intracranial blood products. No mass lesion or midline shift. Diffuse ventricular promine

## 2021-06-22 LAB — CBC
HCT: 48.3 % (ref 39.0–52.0)
Hemoglobin: 15.6 g/dL (ref 13.0–17.0)
MCH: 32.8 pg (ref 26.0–34.0)
MCHC: 32.3 g/dL (ref 30.0–36.0)
MCV: 101.5 fL — ABNORMAL HIGH (ref 80.0–100.0)
Platelets: 262 10*3/uL (ref 150–400)
RBC: 4.76 MIL/uL (ref 4.22–5.81)
RDW: 14.1 % (ref 11.5–15.5)
WBC: 10.7 10*3/uL — ABNORMAL HIGH (ref 4.0–10.5)
nRBC: 0 % (ref 0.0–0.2)

## 2021-06-22 LAB — BASIC METABOLIC PANEL
Anion gap: 9 (ref 5–15)
BUN: 33 mg/dL — ABNORMAL HIGH (ref 8–23)
CO2: 30 mmol/L (ref 22–32)
Calcium: 9.2 mg/dL (ref 8.9–10.3)
Chloride: 103 mmol/L (ref 98–111)
Creatinine, Ser: 1.58 mg/dL — ABNORMAL HIGH (ref 0.61–1.24)
GFR, Estimated: 42 mL/min — ABNORMAL LOW (ref >60)
Glucose, Bld: 101 mg/dL — ABNORMAL HIGH (ref 70–99)
Potassium: 3.8 mmol/L (ref 3.5–5.1)
Sodium: 142 mmol/L (ref 135–145)

## 2021-06-22 LAB — LIPID PANEL
Cholesterol: 158 mg/dL (ref 0–200)
HDL: 39 mg/dL — ABNORMAL LOW (ref >40)
LDL Cholesterol: 104 mg/dL — ABNORMAL HIGH (ref 0–99)
Total CHOL/HDL Ratio: 4.1 RATIO
Triglycerides: 76 mg/dL (ref <150)
VLDL: 15 mg/dL (ref 0–40)

## 2021-06-22 LAB — BODY FLUID CULTURE W GRAM STAIN: Culture: NO GROWTH

## 2021-06-22 LAB — HEMOGLOBIN A1C
Hgb A1c MFr Bld: 5.9 % — ABNORMAL HIGH (ref 4.8–5.6)
Mean Plasma Glucose: 122.63 mg/dL

## 2021-06-22 LAB — MAGNESIUM: Magnesium: 2.5 mg/dL — ABNORMAL HIGH (ref 1.7–2.4)

## 2021-06-22 MED ORDER — APIXABAN 2.5 MG PO TABS: 2.5000 mg | ORAL_TABLET | Freq: Two times a day (BID) | ORAL | Status: DC

## 2021-06-22 MED ORDER — ENSURE ENLIVE PO LIQD: 237.0000 mL | Freq: Three times a day (TID) | ORAL | Status: DC

## 2021-06-22 MED ORDER — ATORVASTATIN CALCIUM 40 MG PO TABS: 40.0000 mg | ORAL_TABLET | Freq: Every day | ORAL | Status: DC

## 2021-06-22 MED ORDER — ASPIRIN 325 MG PO TBEC: 325.0000 mg | DELAYED_RELEASE_TABLET | Freq: Every day | ORAL | 0 refills | Status: AC

## 2021-06-22 MED ORDER — ADULT MULTIVITAMIN W/MINERALS CH
1.0000 | ORAL_TABLET | Freq: Every day | ORAL | Status: DC
Start: 2021-06-23 — End: 2021-07-07

## 2021-06-22 NOTE — Evaluation (Signed)
Physical Therapy Re-Evaluation ?Patient Details ?Name: Robert Frye ?MRN: [id number] ?DOB: 01/10/1932 ?Today's Date: 06/22/2021 ? ?History of Present Illness ? 88 y.o. male with medical history significant for A-fib on Eliquis s/p cardioversion 03/13/2020 and 06/11/2020, HFpEF (EF 50 to 55% 05/2020), chronically elevated troponin with intermediate risk Lexiscan 03/2020, CKD 3B, dementia, hypothyroidism, HTN, chronic dyspnea with history of pleural effusion s/p thoracentesis 10/2020 who presents to the ED with a 1 week history of nonproductive cough, dizziness and progressive weakness, resulting in a fall on the morning of arrival.  Patient apparently went to use the bathroom and stumbled falling in a sitting position onto the commode. He did not get hurt. Wife who gives most of the history denies noticing slurred speech or facial droop or change in mentation. She did say his legs appeared weak. Patient saw his cardiologist on the morning of arrival and they were referred to the emergency room for further evaluation.  ED course: Vitals within normal limits on arrival.  Blood work significant for troponin of 136 (baseline in the low 100s), creatinine 1.83 which is around his baseline, WBC 10.6 with hemoglobin 14.1.  Urinalysis unremarkable.  Procalcitonin, COVID and flu pending.  EKG, personally viewed and interpreted: Sinus at 61 with nonspecific ST-T wave changes.  C-spine CT nonacute but did show large left pleural effusion.  Chest x-ray also shows moderate left pleural effusion. NEW MRI with results of acute ischemic L basal ganglia infarct. ?  ?Clinical Impression ? Pt agreeable to PT re-eval after MRI finding of L basal ganglia infarct due to noted progressive RUE weakness with rehab on 06/20/21. Pt presenting with normal sensation to light touch and proprioception in RLE and normal sensation in RUE. PROM full at shoulder in RUE in flexion with significant muscle weakness at shoulder > elbow and limited grip strength  on R hand compared to L. RLE presenting with 1-2 MMT at ankle DF/PF and quad however limited in findings due to cognition and being [facility]. Does present with 2/5 in hip flexion and abduction. Pt ISQ with current level mobility however requiring maxA for bed mobility, modA with sitting EOB due to posterior bias and R lat lean with inability to hold statically > 5 sec at a time. MaxA to stand to RW with MAX hand over hand placement of RUE on RW with inability to grip handle with R lat lean with resistance despite max multi-modal cuing for attempting midline positioning. X2 attempts made with limited tolerance for ~20-30 sec. Total assist to return to bed with all needs in reach.  Pt remains STR appropriate due to need for significant assist in all aspects of mobility, poor cognition with confusion, muscle weakness, and poor balance placing pt at very high risk for falls at this time.  ?   ? ?Recommendations for follow up therapy are one component of a multi-disciplinary discharge planning process, led by the attending physician.  Recommendations may be updated based on patient status, additional functional criteria and insurance authorization. ? ?Follow Up Recommendations Skilled nursing-short term rehab (<3 hours/day) ? ?  ?Assistance Recommended at Discharge Frequent or constant Supervision/Assistance  ?Patient can return home with the following ? Two people to help with walking and/or transfers;Two people to help with bathing/dressing/bathroom;Direct supervision/assist for medications management;Help with stairs or ramp for entrance;Assist for transportation ? ?  ?Equipment Recommendations  (next venue of care)  ?Recommendations for Other Services ?    ?  ?Functional Status Assessment    ? ?  ?Precautions /  Restrictions Precautions ?Precautions: Fall ?Restrictions ?Weight Bearing Restrictions: No  ? ?  ? ?Mobility ? Bed Mobility ?Overal bed mobility: Needs Assistance ?Bed Mobility: Supine to Sit, Sit to Supine ?  ?   ?Supine to sit: Max assist ?Sit to supine: Total assist ?  ?General bed mobility comments: Pt moves L LE over EOB but assist for all other aspects. ?Patient Response: Flat affect, Cooperative ? ?Transfers ?Overall transfer level: Needs assistance ?Equipment used: Rolling walker (2 wheels) ?Transfers: Sit to/from Stand ?Sit to Stand: Max assist ?  ?  ?  ?  ?  ?General transfer comment: Significant R lateral lean with resistance with midline orientation attempts ?  ? ?Ambulation/Gait ?  ?  ?  ?  ?  ?  ?  ?General Gait Details: Unsafe to perform ? ?Stairs ?  ?  ?  ?  ?  ? ?Wheelchair Mobility ?  ? ?Modified Rankin (Stroke Patients Only) ?  ? ?  ? ?Balance Overall balance assessment: Needs assistance ?Sitting-balance support: Feet supported, Bilateral upper extremity supported ?Sitting balance-Leahy Scale: Poor ?Sitting balance - Comments: min - mod A static sitting balance ?Postural control: Posterior lean, Right lateral lean ?Standing balance support: Bilateral upper extremity supported, During functional activity, Reliant on assistive device for balance ?Standing balance-Leahy Scale: Zero ?Standing balance comment: R lateral lean with resistance and max hand over hand to maintain R hand on RW for support. ?  ?  ?  ?  ?  ?  ?  ?  ?  ?  ?  ?   ? ? ? ?Pertinent Vitals/Pain Pain Assessment ?Pain Assessment: No/denies pain  ? ? ?Home Living Family/patient expects to be discharged to:: Private residence ?Living Arrangements: Spouse/significant other ?Available Help at Discharge: Family;Available 24 hours/day ?Type of Home: House ?Home Access: Stairs to enter ?Entrance Stairs-Rails: Left ?Entrance Stairs-Number of Steps: 4 ?  ?Home Layout: One level ?Home Equipment: Agricultural consultant (2 wheels);Shower seat ?   ?  ?Prior Function Prior Level of Function : Needs assist ?  ?  ?  ?  ?  ?  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 ?ADLs Comments: Wife  reports she assists with self care tasks at home secondary to pt with decreased energy. ?  ? ? ?Hand Dominance  ? Dominant Hand: Right ? ?  ?Extremity/Trunk Assessment  ? Upper Extremity Assessment ?Upper Extremity Assessment: RUE deficits/detail ?RUE Deficits / Details: RUE 3/5 at biceps, limited grip strength, 2/5 at shoulder flexion. PROm at shoudler appears WNL ?RUE Sensation: WNL ?RUE Coordination: decreased fine motor ?  ? ?Lower Extremity Assessment ?Lower Extremity Assessment: Generalized weakness;RLE deficits/detail ?RLE Deficits / Details: 1-2/5 MMT at ankle DF/PF and quad strength. 2/5 grossly with hip flexion and abduction. Proprioception intact ?RLE Sensation: WNL ?RLE Coordination: decreased fine motor;decreased gross motor ?  ? ?   ?Communication  ? Communication: HOH  ?Cognition Arousal/Alertness: Awake/alert ?Behavior During Therapy: [facility] for tasks assessed/performed, Flat affect ?Overall Cognitive Status: History of cognitive impairments - at baseline ?  ?  ?  ?  ?  ?  ?  ?  ?  ?  ?  ?  ?  ?  ?  ?  ?General Comments: increased time to follow commands but follows all commands with hand over hand cuing ?  ?  ? ?  ?General Comments   ? ?  ?Exercises Other Exercises ?Other Exercises: R seated weightshifts onto  RUE for CKC strengthening: x10  ? ?Assessment/Plan  ?  ?PT Assessment Patient needs continued PT services  ?PT Problem List Decreased strength;Decreased mobility;Decreased safety awareness;Decreased activity tolerance;Decreased balance;Decreased knowledge of use of DME ? ?   ?  ?PT Treatment Interventions DME instruction;Therapeutic exercise;Gait training;Balance training;Stair training;Neuromuscular re-education;Functional mobility training;Therapeutic activities;Patient/family education   ? ?PT Goals (Current goals can be found in the Care Plan section)  ?Acute Rehab PT Goals ?Patient Stated Goal: to go home ?PT Goal Formulation: With patient/family ?Time For Goal Achievement:  07/03/21 ?Potential to Achieve Goals: Poor ? ?  ?Frequency 7X/week ?  ? ? ?Co-evaluation   ?  ?  ?  ?  ? ? ?  ?AM-PAC PT "6 Clicks" Mobility  ?Outcome Measure Help needed turning from your back to your side while in a flat b

## 2021-06-22 NOTE — TOC Transition Note (Signed)
Transition of Care (TOC) - CM/SW Discharge Note ? ? ?Patient Details  ?Name: Robert Frye ?MRN: [id number] ?Date of Birth: [birth year] ? ?Transition of Care (TOC) CM/SW Contact:  ?Gildardo Griffes, LCSW ?Phone Number: ?06/22/2021, 11:52 AM ? ? ?Clinical Narrative:    ? ?Patient will DC HW:[id number] ?Anticipated DC date: 06/22/21 ?Family notified:spouse ?Transport RC:VELFY ? ?Per MD patient ready for DC to [facility]. RN, patient, patient's family, and facility notified of DC. Discharge Summary sent to facility. RN given number for report  [phone] Room 513. DC packet on chart. Ambulance transport requested for patient.  ?CSW signing off. ? ?Angeline Slim, LCSW ? ? ? ?Final next level of care: Skilled Nursing Facility ?Barriers to Discharge: No Barriers Identified ? ? ?Patient Goals and CMS Choice ?Patient states their goals for this hospitalization and ongoing recovery are:: to go home ?CMS Medicare.gov Compare Post Acute Care list provided to:: Patient Represenative (must comment) (wife) ?Choice offered to / list presented to : Spouse ? ?Discharge Placement ?  ?           ?Patient chooses bed at: [facility] ?Patient to be transferred to facility by: ems ?Name of family member notified: spouse ?Patient and family notified of of transfer: 06/22/21 ? ?Discharge Plan and Services ?  ?  ?           ?  ?  ?  ?  ?  ?  ?  ?  ?  ?  ? ?Social Determinants of Health (SDOH) Interventions ?  ? ? ?Readmission Risk Interventions ?   ? View : No data to display.  ?  ?  ?  ? ? ? ? ? ?

## 2021-06-22 NOTE — NC FL2 (Signed)
?[place] MEDICAID FL2 LEVEL OF CARE SCREENING TOOL  ?  ? ?IDENTIFICATION  ?Patient Name: ?Robert Frye Birthdate: 02-09-1932 Sex: male Admission Date (Current Location): ?06/18/2021  ?[place] and [place] Number: ? [place] ?  Facility and Address:  ?[facility], [address], [place], [place] [address] ?     Provider Number: ?[id number]  ?Attending Physician Name and Address:  ?Enzo Bi, MD ? Relative Name and Phone Number:  ?Gwinda Passe (spouse) [phone] ?   ?Current Level of Care: ?Hospital Recommended Level of Care: ?[place] Prior Approval Number: ?  ? ?Date Approved/Denied: ?  PASRR Number: ?[id number] ? ?Discharge Plan: ?SNF ?  ? ?Current Diagnoses: ?Patient Active Problem List  ? Diagnosis Date Noted  ? Malnutrition of moderate degree 06/21/2021  ? Acute stroke due to ischemia ([place]) 06/21/2021  ? Acute decompensated heart failure ([place]) 06/19/2021  ? Weakness 06/18/2021  ? Fall at home, initial encounter 06/18/2021  ? (HFpEF) heart failure with preserved ejection fraction ([place]) 06/18/2021  ? Chronic anticoagulation 06/18/2021  ? Recurrent pleural effusion on left 06/18/2021  ? CAP (community acquired pneumonia) 06/18/2021  ? Atrial flutter ([place])   ? Acute on chronic systolic (congestive) heart failure ([place]) 05/27/2020  ? Persistent atrial fibrillation ([place])   ? Acute on chronic heart failure with preserved ejection fraction (HFpEF) ([place])   ? Acquired thrombophilia ([place])   ? AKI (acute kidney injury) ([place])   ? Paroxysmal atrial fibrillation ([place]) 02/13/2020  ? Hypokalemia 02/13/2020  ? Elevated troponin 02/13/2020  ? Essential hypertension 02/13/2020  ? HLD (hyperlipidemia) 02/13/2020  ? Stage 3b chronic kidney disease ([place]) 02/13/2020  ? Acute CHF (congestive heart failure) ([place]) 02/13/2020  ? Dementia without behavioral disturbance ([place]) 04/21/2017  ? Hypothyroidism 10/07/2013  ? ? ?Orientation RESPIRATION BLADDER Height & Weight   ?  ?Self, Time, Situation,  Place ? Normal Incontinent, External catheter Weight: 125 lb 10.6 oz (57 kg) ?Height:  5\' 6"  (167.6 cm)  ?BEHAVIORAL SYMPTOMS/MOOD NEUROLOGICAL BOWEL NUTRITION STATUS  ?    Continent Diet (see discharge summary)  ?AMBULATORY STATUS COMMUNICATION OF NEEDS Skin   ?Limited Assist Verbally Other (Comment) (skin tear arms and elbow) ?  ?  ?  ?    ?     ?     ? ? ?Personal Care Assistance Level of Assistance  ?Bathing, Feeding, Dressing, Total care Bathing Assistance: Limited assistance ?Feeding assistance: Independent ?Dressing Assistance: Limited assistance ?Total Care Assistance: Limited assistance  ? ?Functional Limitations Info  ?Sight, Hearing, Speech Sight Info: Adequate ?Hearing Info: Adequate ?Speech Info: Adequate  ? ? ?SPECIAL CARE FACTORS FREQUENCY  ?PT (By licensed PT), OT (By licensed OT)   ?  ?PT Frequency: min 4x weekly ?OT Frequency: min 4x weekly ?  ?  ?  ?   ? ? ?Contractures Contractures Info: Not present  ? ? ?Additional Factors Info  ?Code Status, Allergies Code Status Info: DNR ?Allergies Info: jardiance (empagliflozin) ?  ?  ?  ?   ? ?Current Medications (06/22/2021):  This is the current hospital active medication list ?Current Facility-Administered Medications  ?Medication Dose Route Frequency Provider Last Rate Last Admin  ? acetaminophen (TYLENOL) tablet 650 mg  650 mg Oral Q6H PRN Athena Masse, MD      ? Or  ? acetaminophen (TYLENOL) suppository 650 mg  650 mg Rectal Q6H PRN Athena Masse, MD      ? amiodarone (PACERONE) tablet 100 mg  100 mg Oral Daily Judd Gaudier  V, MD   100 mg at 06/22/21 [id number]  ? aspirin EC tablet 325 mg  325 mg Oral Daily Greta Doom, MD   325 mg at 06/22/21 [id number]  ? atorvastatin (LIPITOR) tablet 10 mg  10 mg Oral Daily Athena Masse, MD   10 mg at 06/22/21 0902  ? feeding supplement (ENSURE ENLIVE / ENSURE PLUS) liquid 237 mL  237 mL Oral TID BM Enzo Bi, MD   237 mL at 06/21/21 2026  ? furosemide (LASIX) injection 40 mg  40 mg Intravenous Daily Enzo Bi, MD   40 mg at 06/22/21 [id number]  ? heparin injection 5,000 Units  5,000 Units Subcutaneous Azzie Roup, MD   5,000 Units at 06/22/21 0542  ? levothyroxine (SYNTHROID) tablet 75 mcg  75 mcg Oral QAC breakfast Athena Masse, MD   75 mcg at 06/22/21 0542  ? multivitamin with minerals tablet 1 tablet  1 tablet Oral Daily Enzo Bi, MD   1 tablet at 06/22/21 0902  ? ondansetron (ZOFRAN) tablet 4 mg  4 mg Oral Q6H PRN Athena Masse, MD      ? Or  ? ondansetron [facility]) injection 4 mg  4 mg Intravenous Q6H PRN Athena Masse, MD      ? potassium chloride SA (KLOR-CON M) CR tablet 20 mEq  20 mEq Oral Daily Athena Masse, MD   20 mEq at 06/22/21 [id number]  ? vitamin B-12 (CYANOCOBALAMIN) tablet 1,000 mcg  1,000 mcg Oral Daily Athena Masse, MD   1,000 mcg at 06/22/21 [id number]  ? ? ? ?Discharge Medications: ?Please see discharge summary for a list of discharge medications. ? ?Relevant Imaging Results: ? ?Relevant Lab Results: ? ? ?Additional Information ?[id number] ? ?Alberteen Sam, LCSW ? ? ? ? ?

## 2021-06-22 NOTE — Care Management Important Message (Signed)
Important Message ? ?Patient Details  ?Name: Robert Frye ?MRN: [id number] ?Date of Birth: [birth year] ? ? ?Medicare Important Message Given:  Yes ? ? ? ? ?[name] ?06/22/2021, 1:49 PM ?

## 2021-06-22 NOTE — Discharge Summary (Signed)
? ?Physician Discharge Summary ? ? ?[name]  male DOB: 10-30-1931  ?ES:[id number] ? ?PCP: Baxter Hire, MD ? ?Admit date: 06/18/2021 ?Discharge date: 06/22/2021 ? ?Admitted From: home ?Disposition:  SNF ?Family updated at bedside prior to discharge. ? ?CODE STATUS: DNR ? ?Discharge Instructions   ? ? No wound care   Complete by: As directed ?  ? ?  ? ?Hospital Course:  ?For full details, please see H&P, progress notes, consult notes and ancillary notes.  ?Briefly,  ?Robert Frye is a 88 y.o. male with medical history significant for A-fib on Eliquis s/p cardioversion 03/13/2020 and 06/11/2020, HFpEF (EF 50 to 55% 05/2020), CKD 3B, dementia, hypothyroidism, HTN, chronic dyspnea with history of pleural effusion s/p thoracentesis 10/2020 who presented to the ED with a 1 week history of nonproductive cough, dizziness and progressive weakness, resulting in a fall on the morning of arrival.   ? ?Patient apparently went to use the bathroom and stumbled falling in a sitting position onto the commode. He did not get hurt. Wife who gave most of the history denied noticing slurred speech or facial droop or change in mentation. She did say his legs appeared weak. Patient saw his cardiologist on the morning of arrival and they were referred to the emergency room for further evaluation. ?  ?Initially there was concern for community-acquired pneumonia.  This is felt unlikely.  Procalcitonin negative.  Patient afebrile.  Antibiotics were discontinued.  Pt was found to have pleural effusion and received thoracentesis and started on treatment for CHF with diuretics.  Further workup during hospitalization found pt had suffered acute stroke. ?  ?# Acute ischemic stroke ?--Pt presented with leg weakness and then right arm weakness.  MRI brain pos for 2.4 cm acute ischemic perforator type infarct involving the posterior left basal ganglia. ?--neuro consulted  ?--Home Eliquis held and can be resumed on 06/26/21.  In the meantime,  pt should take ASA 325 mg daily throughout 06/25/21, per neuro rec. ?--LDL 104, so home lipitor 10 mg daily is increased to 40 mg daily. ?--Pt was making progressive with PT/OT and will continue rehab at [facility] after discharge. ?  ?Acute decompensated diastolic congestive heart failure with  ?Pulm HTN ?Patient is known to [facility] cardiology ?Case discussed with Christell Faith, PA.  Saw primary cardiologist in office 3/27 ?Currently no indication for inpatient cardiology consult ?--Pt recieved IV lasix 40 mg daily during hospitalization and was discharged on home lasix 20 mg daily. ?  ?Moderate left-sided pleural effusion ?Hx of recurrent pleural effusion ?--likely secondary to above ?--s/p thoracentesis with 1L removed ?--Pt recieved IV lasix 40 mg daily during hospitalization and was discharged on home lasix 20 mg daily. ?  ?Elevated troponin ?chronically elevated troponin with intermediate risk Lexiscan 03/2020. ?--trop 136 and 156, was about 200 a year ago. ?Saw primary cardiologist in office 3/27 ?Currently no indication for inpatient cardiology consult. ?  ?Paroxysmal atrial fibrillation ?Status post DC cardioversion twice in the past ?Currently heart rate stable ?--Continue amiodarone ?--hold home eliquis given sizable stroke, can be resumed on 06/26/21. ?  ?Community-acquired pneumonia, ruled out ?No obvious infiltrate on chest x-ray ?Procalcitonin negative, patient afebrile, no white count ?Discontinued antibiotics ?  ?Weakness ?Right arm weakness ?Initial CT head with no evidence of acute injury.  Subsequent MRI brain found acute stroke.  See above. ?  ?Hypothyroidism ?--cont Synthroid ?  ?Dementia without behavioral disturbance ?Delirium precautions ?  ?Stage IIIb chronic kidney disease ?Creatinine at or near baseline,  1.5-1.8 ?  ?Essential hypertension, not currently active ?--only on Lasix 20 mg daily PTA.  BP has been wnl.   ? ? ?Discharge Diagnoses:  ?Principal Problem: ?  Acute stroke due to ischemia [facility]) ?Active  Problems: ?  Weakness ?  CAP (community acquired pneumonia) ?  Elevated troponin ?  Fall at home, initial encounter ?  Recurrent pleural effusion on left ?  Paroxysmal atrial fibrillation (HCC) ?  Chronic anticoagulation ?  (HFpEF) heart failure with preserved ejection fraction ([place]) ?  Essential hypertension ?  Stage 3b chronic kidney disease ([place]) ?  Dementia without behavioral disturbance ([name]) ?  Hypothyroidism ?  Acute decompensated heart failure ([place]) ?  Malnutrition of moderate degree ? ? ?30 Day Unplanned Readmission Risk Score   ? ?Flowsheet Row ED to Hosp-Admission (Current) from 06/18/2021 in [place] PCU  ?30 Day Unplanned Readmission Risk Score (%) 13.88 Filed at 06/22/2021 0801  ? ?  ? ? This score is the patient's risk of an unplanned readmission within 30 days of being discharged (0 -100%). The score is based on dignosis, age, lab data, medications, orders, and past utilization.   ?Low:  0-14.9   Medium: 15-21.9   High: 22-29.9   Extreme: 30 and above ? ?  ? ?  ? ? ?Discharge Instructions: ? ?Allergies as of 06/22/2021   ? ?   Reactions  ? Jardiance [empagliflozin] Other (See Comments)  ? Dizziness/ weakness  ? ?  ? ?  ?Medication List  ?  ? ?TAKE these medications   ? ?amiodarone 100 MG tablet ?Commonly known as: PACERONE ?Take 1 tablet (100 mg total) by mouth daily. ?  ?apixaban 2.5 MG Tabs tablet ?Commonly known as: ELIQUIS ?Take 1 tablet (2.5 mg total) by mouth 2 (two) times daily. ?Start taking on: June 26, 2021 ?What changed: These instructions start on June 26, 2021. If you are unsure what to do until then, ask your doctor or other care provider. ?  ?aspirin 325 MG EC tablet ?Take 1 tablet (325 mg total) by mouth daily for 3 days. ?Start taking on: June 23, 2021 ?  ?atorvastatin 40 MG tablet ?Commonly known as: LIPITOR ?Take 1 tablet (40 mg total) by mouth daily. ?What changed:  ?medication strength ?how much to take ?  ?CLEAR EYES OP ?Place 1 drop into both eyes daily as  needed (dry/itchy eyes). ?  ?cyanocobalamin 1000 MCG tablet ?Take 1,000 mcg by mouth daily. ?  ?Dialyvite Vitamin D 5000 125 MCG (5000 UT) capsule ?Generic drug: Cholecalciferol ?Take 2,000 Units by mouth daily. ?  ?feeding supplement Liqd ?Take 237 mLs by mouth 3 (three) times daily between meals. ?  ?furosemide 20 MG tablet ?Commonly known as: LASIX ?Take 1 tablet (20 mg total) by mouth daily. ?  ?levothyroxine 75 MCG tablet ?Commonly known as: SYNTHROID ?Take 75 mcg by mouth daily before breakfast. ?  ?multivitamin with minerals Tabs tablet ?Take 1 tablet by mouth daily. ?Start taking on: June 23, 2021 ?  ?potassium chloride SA 20 MEQ tablet ?Commonly known as: Klor-Con M20 ?Take 1 tablet (20 mEq total) by mouth daily. ?  ? ?  ? ? ? Follow-up Information   ? ? Baxter Hire, MD Follow up.   ?Specialty: Internal Medicine ?Contact information: ?[address] ?[place] [address] ?[phone] ? ? ?  ?  ? ?  ?  ? ?  ? ? ?Allergies  ?Allergen Reactions  ? Jardiance [Empagliflozin] Other (See Comments)  ?  Dizziness/ weakness  ? ? ? ?The results of significant diagnostics from this hospitalization (including imaging, microbiology, ancillary and laboratory) are listed below for reference.  ? ?Consultations: ? ? ?Procedures/Studies: ?DG Chest 2 View ? ?Result Date: 06/18/2021 ?CLINICAL DATA:  Weakness. EXAM: CHEST - 2 VIEW COMPARISON:  Chest radiograph dated 05/30/2020. FINDINGS: Moderate left pleural effusion and left lung base atelectasis or infiltrate. The right lung is clear. There is no pneumothorax. The cardiac silhouette is within normal limits. Atherosclerotic calcification of the aorta. Degenerative changes of the spine. No acute osseous pathology. IMPRESSION: Moderate left pleural effusion and left lung base atelectasis or infiltrate. Electronically Signed   By: Anner Crete M.D.   On: 06/18/2021 19:49  ? ?CT HEAD WO CONTRAST (5MM) ? ?Result Date: 06/18/2021 ?CLINICAL DATA:  Right lower extremity  weakness worsening for several weeks, history of dementia EXAM: CT HEAD WITHOUT CONTRAST TECHNIQUE: Contiguous axial images were obtained from the base of the skull through the vertex without intravenous

## 2021-06-22 NOTE — Progress Notes (Signed)
Speech Language Pathology Evaluation ?Patient Details ?Name: Robert Frye ?MRN: [id number] ?DOB: 12/22/1931 ?Today's Date: 06/22/2021 ?Time: [id number] ?SLP Time Calculation (min) (ACUTE ONLY): 35 min ? ?Problem List:  ?Patient Active Problem List  ? Diagnosis Date Noted  ? Malnutrition of moderate degree 06/21/2021  ? Acute stroke due to ischemia ([place]) 06/21/2021  ? Acute decompensated heart failure ([place]) 06/19/2021  ? Weakness 06/18/2021  ? Fall at home, initial encounter 06/18/2021  ? (HFpEF) heart failure with preserved ejection fraction ([place]) 06/18/2021  ? Chronic anticoagulation 06/18/2021  ? Recurrent pleural effusion on left 06/18/2021  ? CAP (community acquired pneumonia) 06/18/2021  ? Atrial flutter ([place])   ? Acute on chronic systolic (congestive) heart failure ([place]) 05/27/2020  ? Persistent atrial fibrillation ([place])   ? Acute on chronic heart failure with preserved ejection fraction (HFpEF) ([place])   ? Acquired thrombophilia ([place])   ? AKI (acute kidney injury) ([place])   ? Paroxysmal atrial fibrillation ([place]) 02/13/2020  ? Hypokalemia 02/13/2020  ? Elevated troponin 02/13/2020  ? Essential hypertension 02/13/2020  ? HLD (hyperlipidemia) 02/13/2020  ? Stage 3b chronic kidney disease ([place]) 02/13/2020  ? Acute CHF (congestive heart failure) ([place]) 02/13/2020  ? Dementia without behavioral disturbance ([place]) 04/21/2017  ? Hypothyroidism 10/07/2013  ? ?Past Medical History:  ?Past Medical History:  ?Diagnosis Date  ? (HFpEF) heart failure with preserved ejection fraction ([place])   ? CKD (chronic kidney disease), stage III ([place])   ? Dementia ([place])   ? History of stress test   ? a. 03/2020 MV: No ischemia/infarct. EF 40% (55-60% by echo 01/2020).  ? Hyperlipidemia   ? Hypertension   ? Hypertrophic cardiomyopathy ([place])   ? a. 01/2020 Echo: EF 55-60%, no rwma. Sev eccentric LVH of apical and basal-septal segments. gr1 DD. Mildly enlarged RV w/ nl RV fxn. sev dil LA, mod dil RA. Mild MR.  ? Hypothyroidism   ? Persistent atrial  fibrillation ([place])   ? a. Dx 01/2020 s/p DCCV 02/2020; b. 02/2020 Recurrent Afib noted-->rate controlled-->amio started 05/2020 in setting of ongoing CHF; c. CHA2DS2VASc = 4-->Eliquis 2.5 BID.  ? ?Past Surgical History:  ?Past Surgical History:  ?Procedure Laterality Date  ? CARDIOVERSION N/A 03/14/2020  ? Procedure: CARDIOVERSION;  Surgeon: Wellington Hampshire, MD;  Location: [facility] ORS;  Service: Cardiovascular;  Laterality: N/A;  ? CARDIOVERSION N/A 05/31/2020  ? Procedure: CARDIOVERSION;  Surgeon: Kate Sable, MD;  Location: [facility] ORS;  Service: Cardiovascular;  Laterality: N/A;  ? CATARACT EXTRACTION W/PHACO Right 03/13/2021  ? Procedure: CATARACT EXTRACTION PHACO AND INTRAOCULAR LENS PLACEMENT (IOC) RIGHT 20.48 01:55.4;  Surgeon: Birder Robson, MD;  Location: [place];  Service: Ophthalmology;  Laterality: Right;  ? CATARACT EXTRACTION W/PHACO Left 04/03/2021  ? Procedure: CATARACT EXTRACTION PHACO AND INTRAOCULAR LENS PLACEMENT (IOC) LEFT 12.07 00:58.5;  Surgeon: Birder Robson, MD;  Location: [place];  Service: Ophthalmology;  Laterality: Left;  ? HERNIA REPAIR    ? ?HPI:  ?Pt is an 88 y.o. male with medical history significant for A-fib on Eliquis s/p cardioversion 03/13/2020 and 06/11/2020, HFpEF (EF 50 to 55% 05/2020), chronically elevated troponin with intermediate risk Lexiscan 03/2020, CKD 3B, dementia, hypothyroidism, HTN, chronic dyspnea with history of pleural effusion s/p thoracentesis 10/2020 admitted with Acute decompensated diastolic congestive heart failure, Moderate left-sided pleural effusion, likely secondary to above. MRI Brain on 06/20/21 noted for 2.4 cm acute ichemic perforator type infarct involving the posterior left basal ganglia, age-related cerebral atrophy and mild chronic small vessel ischemic disease. SLP  evaluated for swallowing on 06/20/21 with recommendation for regular/mech soft diet with meats cut and moistened, thin liquids via cup, no straw, meds  whole in puree.  ? ?Assessment / Plan / Recommendation ?Clinical Impression ?  Patient presents with significant cognitive communication deficits likely mildly worsened from baseline, based on caregiver report and SLP assessment/observation. He also presents with mild-moderate hypokinetic dysarthria, characterized by reduced vocal intensity and hoarse vocal quality. Wife reports onset of hoarse voice ~1 week ago. Pt is approximately 75% intelligible due to reduced vocal intensity. Oral motor assessment reveals symmetrical movement/strength. Patient scored 5/30 on the [place] Examination, consistent with his baseline dementia. Wife reports pt's confusion has improved slightly over the previous 2 days, but that he is slightly more confused than his baseline. She reports pt tolerating current diet, though intake has been very little. At this time no further acute ST needs identified, however pt may benefit from follow-up with SLP at next level of care to maximize vocal quality and intelligibility. SLP to s/o.   ?   ?SLP Assessment ? SLP Recommendation/Assessment: All further Speech Lanaguage Pathology  needs can be addressed in the next venue of care ?SLP Visit Diagnosis: Cognitive communication deficit (R41.841);Dysarthria and anarthria (R47.1)  ?  ?Recommendations for follow up therapy are one component of a multi-disciplinary discharge planning process, led by the attending physician.  Recommendations may be updated based on patient status, additional functional criteria and insurance authorization. ?   ?Follow Up Recommendations ? Skilled nursing-short term rehab (<3 hours/day)  ?  ?Assistance Recommended at Discharge ? Frequent or constant Supervision/Assistance  ?Functional Status Assessment Patient has had a recent decline in their functional status and/or demonstrates limited ability to make significant improvements in function in a reasonable and predictable amount of time (Fair prognosis for  dysarthria, cognitive communication due to baseline deficits)  ?Frequency and Duration Other (Comment) (evaluation only)  ?  ?  ?   ?SLP Evaluation ?Cognition ? Overall Cognitive Status: History of cognitive impairments - at baseline ?Arousal/Alertness: Awake/alert ?Orientation Level: Oriented to person;Oriented to place;Disoriented to time;Disoriented to situation ?Year: Other (Comment) ("I don't know.") ?Month: July ?Day of Week: Incorrect ?Attention: Sustained ?Sustained Attention:  (focuses for periods of 1-2 minutes on simple conversation) ?Memory: Impaired ?Memory Impairment: Decreased short term memory;Decreased recall of new information ?Decreased Short Term Memory: Verbal basic ?Problem Solving: Impaired ?Problem Solving Impairment: Verbal basic ?Safety/Judgment: Impaired  ?  ?   ?Comprehension ? Auditory Comprehension ?Overall Auditory Comprehension: Other (comment) (VERY HOH even with hearing aid) ?Yes/No Questions: Within Functional Limits ?Commands: Within Functional Limits (able to follow 3 step command with repetition) ?Conversation: Simple ?Interfering Components: Hearing;Attention;Processing speed;Working memory ?EffectiveTechniques: Repetition;Pausing;Extra processing time;Increased volume ?Visual Recognition/Discrimination ?Discrimination: Not tested ?Reading Comprehension ?Reading Status: Not tested  ?  ?Expression Expression ?Primary Mode of Expression: Verbal ?Verbal Expression ?Overall Verbal Expression: Appears within functional limits for tasks assessed (for simple conversational responses) ?Written Expression ?Dominant Hand: Right ?Written Expression: Not tested   ?Oral / Motor ? Oral Motor/Sensory Function ?Overall Oral Motor/Sensory Function: Within functional limits (no focal deficits noted, generalized weakness) ?Motor Speech ?Overall Motor Speech: Impaired ?Respiration: Within functional limits ?Phonation: Hoarse;Low vocal intensity ?Resonance: Within functional limits ?Articulation:  Within functional limitis ?Intelligibility: Intelligibility reduced ?Sentence: 75-100% accurate (75%, due to low vocal intensity)   ?        ?Deneise Lever, MS, CCC-SLP ?Speech-Language Pathologist ?[phone]

## 2021-06-22 NOTE — TOC Progression Note (Signed)
Transition of Care (TOC) - Progression Note  ? ? ?Patient Details  ?Name: EMARION TORAL ?MRN: [id number] ?Date of Birth: July 02, 1931 ? ?Transition of Care (TOC) CM/SW Contact  ?Gildardo Griffes, LCSW ?Phone Number: ?06/22/2021, 10:37 AM ? ?Clinical Narrative:    ? ?Spoke with wife and family at bedside, now in agreement with SNF with preference for [facility].  ? ?Verlon Au at [facility] reports they accept, CSW has started insurance auth through [place]. Pending auth at this time.  ? ? ?Expected Discharge Plan:  (TBD) ?Barriers to Discharge: Continued Medical Work up ? ?Expected Discharge Plan and Services ?Expected Discharge Plan:  (TBD) ?  ?  ?  ?Living arrangements for the past 2 months: Single Family Home ?                ?  ?  ?  ?  ?  ?  ?  ?  ?  ?  ? ? ?Social Determinants of Health (SDOH) Interventions ?  ? ?Readmission Risk Interventions ?   ? View : No data to display.  ?  ?  ?  ? ? ?

## 2021-06-28 ENCOUNTER — Ambulatory Visit: Payer: Medicare Other | Admitting: Family

## 2021-07-01 ENCOUNTER — Emergency Department: Payer: Medicare Other

## 2021-07-01 ENCOUNTER — Inpatient Hospital Stay
Admission: EM | Admit: 2021-07-01 | Discharge: 2021-07-07 | DRG: 682 | Disposition: A | Discharge status: Hospice | Payer: Medicare Other | Source: Skilled Nursing Facility | Attending: Internal Medicine | Admitting: Internal Medicine

## 2021-07-01 ENCOUNTER — Other Ambulatory Visit: Payer: Self-pay

## 2021-07-01 DIAGNOSIS — Z7189 Other specified counseling: Secondary | ICD-10-CM | POA: Diagnosis not present

## 2021-07-01 DIAGNOSIS — E870 Hyperosmolality and hypernatremia: Principal | ICD-10-CM | POA: Diagnosis present

## 2021-07-01 DIAGNOSIS — Z66 Do not resuscitate: Secondary | ICD-10-CM | POA: Diagnosis present

## 2021-07-01 DIAGNOSIS — R52 Pain, unspecified: Secondary | ICD-10-CM | POA: Diagnosis not present

## 2021-07-01 DIAGNOSIS — I5030 Unspecified diastolic (congestive) heart failure: Secondary | ICD-10-CM | POA: Diagnosis present

## 2021-07-01 DIAGNOSIS — E039 Hypothyroidism, unspecified: Secondary | ICD-10-CM | POA: Diagnosis present

## 2021-07-01 DIAGNOSIS — F0390 Unspecified dementia without behavioral disturbance: Secondary | ICD-10-CM | POA: Diagnosis present

## 2021-07-01 DIAGNOSIS — R739 Hyperglycemia, unspecified: Secondary | ICD-10-CM | POA: Diagnosis not present

## 2021-07-01 DIAGNOSIS — E878 Other disorders of electrolyte and fluid balance, not elsewhere classified: Secondary | ICD-10-CM | POA: Diagnosis present

## 2021-07-01 DIAGNOSIS — E785 Hyperlipidemia, unspecified: Secondary | ICD-10-CM | POA: Diagnosis present

## 2021-07-01 DIAGNOSIS — Z79899 Other long term (current) drug therapy: Secondary | ICD-10-CM

## 2021-07-01 DIAGNOSIS — G9341 Metabolic encephalopathy: Secondary | ICD-10-CM | POA: Diagnosis present

## 2021-07-01 DIAGNOSIS — E860 Dehydration: Secondary | ICD-10-CM | POA: Diagnosis present

## 2021-07-01 DIAGNOSIS — Z7901 Long term (current) use of anticoagulants: Secondary | ICD-10-CM | POA: Diagnosis not present

## 2021-07-01 DIAGNOSIS — R627 Adult failure to thrive: Secondary | ICD-10-CM | POA: Diagnosis not present

## 2021-07-01 DIAGNOSIS — D72829 Elevated white blood cell count, unspecified: Secondary | ICD-10-CM | POA: Diagnosis present

## 2021-07-01 DIAGNOSIS — I130 Hypertensive heart and chronic kidney disease with heart failure and stage 1 through stage 4 chronic kidney disease, or unspecified chronic kidney disease: Secondary | ICD-10-CM | POA: Diagnosis present

## 2021-07-01 DIAGNOSIS — I422 Other hypertrophic cardiomyopathy: Secondary | ICD-10-CM | POA: Diagnosis present

## 2021-07-01 DIAGNOSIS — Z79890 Hormone replacement therapy (postmenopausal): Secondary | ICD-10-CM

## 2021-07-01 DIAGNOSIS — I5032 Chronic diastolic (congestive) heart failure: Secondary | ICD-10-CM | POA: Diagnosis present

## 2021-07-01 DIAGNOSIS — N179 Acute kidney failure, unspecified: Secondary | ICD-10-CM | POA: Diagnosis not present

## 2021-07-01 DIAGNOSIS — I69391 Dysphagia following cerebral infarction: Secondary | ICD-10-CM

## 2021-07-01 DIAGNOSIS — E43 Unspecified severe protein-calorie malnutrition: Secondary | ICD-10-CM | POA: Diagnosis present

## 2021-07-01 DIAGNOSIS — E861 Hypovolemia: Secondary | ICD-10-CM | POA: Diagnosis present

## 2021-07-01 DIAGNOSIS — Z515 Encounter for palliative care: Secondary | ICD-10-CM | POA: Diagnosis not present

## 2021-07-01 DIAGNOSIS — I69351 Hemiplegia and hemiparesis following cerebral infarction affecting right dominant side: Secondary | ICD-10-CM | POA: Diagnosis not present

## 2021-07-01 DIAGNOSIS — Z8249 Family history of ischemic heart disease and other diseases of the circulatory system: Secondary | ICD-10-CM

## 2021-07-01 DIAGNOSIS — R1310 Dysphagia, unspecified: Secondary | ICD-10-CM | POA: Diagnosis not present

## 2021-07-01 DIAGNOSIS — I4819 Other persistent atrial fibrillation: Secondary | ICD-10-CM | POA: Diagnosis present

## 2021-07-01 DIAGNOSIS — F03C0 Unspecified dementia, severe, without behavioral disturbance, psychotic disturbance, mood disturbance, and anxiety: Secondary | ICD-10-CM | POA: Diagnosis not present

## 2021-07-01 DIAGNOSIS — I10 Essential (primary) hypertension: Secondary | ICD-10-CM | POA: Diagnosis present

## 2021-07-01 DIAGNOSIS — Z87891 Personal history of nicotine dependence: Secondary | ICD-10-CM

## 2021-07-01 DIAGNOSIS — J90 Pleural effusion, not elsewhere classified: Secondary | ICD-10-CM | POA: Diagnosis present

## 2021-07-01 DIAGNOSIS — R0609 Other forms of dyspnea: Secondary | ICD-10-CM | POA: Diagnosis not present

## 2021-07-01 DIAGNOSIS — N1832 Chronic kidney disease, stage 3b: Secondary | ICD-10-CM | POA: Diagnosis present

## 2021-07-01 DIAGNOSIS — Z8673 Personal history of transient ischemic attack (TIA), and cerebral infarction without residual deficits: Secondary | ICD-10-CM

## 2021-07-01 HISTORY — DX: Cerebral infarction, unspecified: I63.9

## 2021-07-01 LAB — URINALYSIS, COMPLETE (UACMP) WITH MICROSCOPIC
Bacteria, UA: NONE SEEN
Bilirubin Urine: NEGATIVE
Glucose, UA: NEGATIVE mg/dL
Hgb urine dipstick: NEGATIVE
Ketones, ur: NEGATIVE mg/dL
Leukocytes,Ua: NEGATIVE
Nitrite: NEGATIVE
Protein, ur: NEGATIVE mg/dL
Specific Gravity, Urine: 1.023 (ref 1.005–1.030)
pH: 5 (ref 5.0–8.0)

## 2021-07-01 LAB — COMPREHENSIVE METABOLIC PANEL
ALT: 32 U/L (ref 0–44)
AST: 54 U/L — ABNORMAL HIGH (ref 15–41)
Albumin: 3.8 g/dL (ref 3.5–5.0)
Alkaline Phosphatase: 137 U/L — ABNORMAL HIGH (ref 38–126)
Anion gap: 16 — ABNORMAL HIGH (ref 5–15)
BUN: 113 mg/dL — ABNORMAL HIGH (ref 8–23)
CO2: 25 mmol/L (ref 22–32)
Calcium: 10.5 mg/dL — ABNORMAL HIGH (ref 8.9–10.3)
Chloride: 119 mmol/L — ABNORMAL HIGH (ref 98–111)
Creatinine, Ser: 3.21 mg/dL — ABNORMAL HIGH (ref 0.61–1.24)
GFR, Estimated: 18 mL/min — ABNORMAL LOW (ref >60)
Glucose, Bld: 106 mg/dL — ABNORMAL HIGH (ref 70–99)
Potassium: 4.1 mmol/L (ref 3.5–5.1)
Sodium: 160 mmol/L — ABNORMAL HIGH (ref 135–145)
Total Bilirubin: 1.6 mg/dL — ABNORMAL HIGH (ref 0.3–1.2)
Total Protein: 8.4 g/dL — ABNORMAL HIGH (ref 6.5–8.1)

## 2021-07-01 LAB — CBC
HCT: 57.7 % — ABNORMAL HIGH (ref 39.0–52.0)
Hemoglobin: 18.2 g/dL — ABNORMAL HIGH (ref 13.0–17.0)
MCH: 33.1 pg (ref 26.0–34.0)
MCHC: 31.5 g/dL (ref 30.0–36.0)
MCV: 104.9 fL — ABNORMAL HIGH (ref 80.0–100.0)
Platelets: 325 10*3/uL (ref 150–400)
RBC: 5.5 MIL/uL (ref 4.22–5.81)
RDW: 14.6 % (ref 11.5–15.5)
WBC: 12.6 10*3/uL — ABNORMAL HIGH (ref 4.0–10.5)
nRBC: 0 % (ref 0.0–0.2)

## 2021-07-01 LAB — PROTIME-INR
INR: 1.3 — ABNORMAL HIGH (ref 0.8–1.2)
Prothrombin Time: 16.1 seconds — ABNORMAL HIGH (ref 11.4–15.2)

## 2021-07-01 MED ORDER — DEXTROSE-NACL 5-0.45 % IV SOLN: INTRAVENOUS | Status: DC

## 2021-07-01 MED ORDER — SODIUM CHLORIDE 0.9 % IV SOLN
1000.0000 mL | Freq: Once | INTRAVENOUS | Status: AC
Start: 2021-07-01 — End: 2021-07-01
  Administered 2021-07-01: 1000 mL via INTRAVENOUS

## 2021-07-01 MED ORDER — DEXTROSE 5 % IV SOLN: INTRAVENOUS | Status: DC

## 2021-07-01 MED ORDER — ACETAMINOPHEN 325 MG PO TABS: 650.0000 mg | ORAL_TABLET | Freq: Four times a day (QID) | ORAL | Status: DC | PRN

## 2021-07-01 MED ORDER — ONDANSETRON HCL 4 MG PO TABS: 4.0000 mg | ORAL_TABLET | Freq: Four times a day (QID) | ORAL | Status: DC | PRN

## 2021-07-01 MED ORDER — HEPARIN SODIUM (PORCINE) 5000 UNIT/ML IJ SOLN
5000.0000 [IU] | Freq: Three times a day (TID) | INTRAMUSCULAR | Status: DC
  Administered 2021-07-01 – 2021-07-03 (×6): 5000 [IU] via SUBCUTANEOUS
  Filled 2021-07-01 (×6): qty 1

## 2021-07-01 MED ORDER — ONDANSETRON HCL 4 MG/2ML IJ SOLN: 4.0000 mg | Freq: Four times a day (QID) | INTRAMUSCULAR | Status: DC | PRN

## 2021-07-01 MED ORDER — ACETAMINOPHEN 650 MG RE SUPP: 650.0000 mg | Freq: Four times a day (QID) | RECTAL | Status: DC | PRN

## 2021-07-01 MED ORDER — POLYVINYL ALCOHOL 1.4 % OP SOLN
1.0000 [drp] | OPHTHALMIC | Status: DC | PRN
  Filled 2021-07-01: qty 15

## 2021-07-01 NOTE — Assessment & Plan Note (Addendum)
Levothyroxine d/c'd per comfort care ?

## 2021-07-01 NOTE — Assessment & Plan Note (Addendum)
Blood pressures stable on admission. ?Takes 20 mg Lasix daily PTA. ?

## 2021-07-01 NOTE — ED Provider Notes (Signed)
? ?[facility] ?Provider Note ? ? ? Event Date/Time  ? First MD Initiated Contact with Patient 07/01/21 1044   ?  (approximate) ? ? ?History  ? ?Altered Mental Status ? ? ?HPI ? ?Robert Frye is a 88 y.o. male with a history of CKD, dementia, stroke, atrial fibrillation who presents with decreased p.o. intake.  Patient was recently discharged from hospital last week after CVA.  His wife reports that nursing home patient has not been taking in p.o.'s and she is becoming very concerned.  She is concerned that he is very dehydrated ?  ? ? ?Physical Exam  ? ?Triage Vital Signs: ?ED Triage Vitals  ?Enc Vitals Group  ?   BP 07/01/21 1043 122/80  ?   Pulse Rate 07/01/21 1043 78  ?   Resp 07/01/21 1043 (!) 23  ?   Temp 07/01/21 1052 (!) 97.4 ?F (36.3 ?C)  ?   Temp Source 07/01/21 1052 Axillary  ?   SpO2 07/01/21 1046 100 %  ?   Weight 07/01/21 1039 57.1 kg (125 lb 14.1 oz)  ?   Height 07/01/21 1039 1.676 m (5\' 6" )  ?   Head Circumference --   ?   Peak Flow --   ?   Pain Score --   ?   Pain Loc --   ?   Pain Edu? --   ?   Excl. in GC? --   ? ? ?Most recent vital signs: ?Vitals:  ? 07/01/21 1046 07/01/21 1052  ?BP:    ?Pulse:    ?Resp:    ?Temp:  (!) 97.4 ?F (36.3 ?C)  ?SpO2: 100%   ? ? ? ?General: Chronically ill-appearing ?CV:  Poor peripheral perfusion ?Resp:  Normal effort.  ?Abd:  No distention.  ?Other:  Dry mucous membrane ? ? ?ED Results / Procedures / Treatments  ? ?Labs ?(all labs ordered are listed, but only abnormal results are displayed) ?Labs Reviewed  ?PROTIME-INR - Abnormal; Notable for the following components:  ?    Result Value  ? Prothrombin Time 16.1 (*)   ? INR 1.3 (*)   ? All other components within normal limits  ?CBC - Abnormal; Notable for the following components:  ? WBC 12.6 (*)   ? Hemoglobin 18.2 (*)   ? HCT 57.7 (*)   ? MCV 104.9 (*)   ? All other components within normal limits  ?COMPREHENSIVE METABOLIC PANEL - Abnormal; Notable for the following components:  ? Sodium  160 (*)   ? Chloride 119 (*)   ? Glucose, Bld 106 (*)   ? BUN 113 (*)   ? Creatinine, Ser 3.21 (*)   ? Calcium 10.5 (*)   ? Total Protein 8.4 (*)   ? AST 54 (*)   ? Alkaline Phosphatase 137 (*)   ? Total Bilirubin 1.6 (*)   ? GFR, Estimated 18 (*)   ? Anion gap 16 (*)   ? All other components within normal limits  ?URINALYSIS, COMPLETE (UACMP) WITH MICROSCOPIC  ? ? ? ?EKG ? ?ED ECG REPORT ?I, 08/31/21, the attending physician, personally viewed and interpreted this ECG. ? ?Date: 07/01/2021 ? ?Rhythm: normal sinus rhythm ?QRS Axis: normal ?Intervals: normal ?ST/T Wave abnormalities: normal ?Narrative Interpretation: no evidence of acute ischemia ? ? ? ?RADIOLOGY ?Chest x-ray viewed interpret by me, no acute abnormality ? ? ? ?PROCEDURES: ? ?Critical Care performed: yes ? ?CRITICAL CARE ?Performed by: 08/31/2021 ? ? ?Total critical care  time: 30 minutes ? ?Critical care time was exclusive of separately billable procedures and treating other patients. ? ?Critical care was necessary to treat or prevent imminent or life-threatening deterioration. ? ?Critical care was time spent personally by me on the following activities: development of treatment plan with patient and/or surrogate as well as nursing, discussions with consultants, evaluation of patient's response to treatment, examination of patient, obtaining history from patient or surrogate, ordering and performing treatments and interventions, ordering and review of laboratory studies, ordering and review of radiographic studies, pulse oximetry and re-evaluation of patient's condition. ? ? ?Procedures ? ? ?MEDICATIONS ORDERED IN ED: ?Medications  ?heparin injection 5,000 Units (has no administration in time range)  ?acetaminophen (TYLENOL) tablet 650 mg (has no administration in time range)  ?  Or  ?acetaminophen (TYLENOL) suppository 650 mg (has no administration in time range)  ?ondansetron (ZOFRAN) tablet 4 mg (has no administration in time range)  ?  Or   ?ondansetron (ZOFRAN) injection 4 mg (has no administration in time range)  ?dextrose 5 %-0.45 % sodium chloride infusion (has no administration in time range)  ?0.9 %  sodium chloride infusion (1,000 mLs Intravenous New Bag/Given 07/01/21 1228)  ? ? ? ?IMPRESSION / MDM / ASSESSMENT AND PLAN / ED COURSE  ?I reviewed the triage vital signs and the nursing notes. ? ?Patient presents with decreased p.o. intake.  He is significantly dehydrated on exam.  Concern for electrolyte abnormalities, AKI we will start IV fluids, while we await labs ? ?Lab work is notable for a sodium of 160, creatinine of 3.21, up from 1.58 10 days ago, BUN of 113 ? ?IV fluids are infusing.  Patient with significant electrolyte abnormalities as detailed above.  Will require admission to, discussed with the hospitalist who will admit the patient ? ? ? ? ? ?  ? ? ?FINAL CLINICAL IMPRESSION(S) / ED DIAGNOSES  ? ?Final diagnoses:  ?Hypernatremia  ?Acute renal failure, unspecified acute renal failure type (HCC)  ?Dehydration  ? ? ? ?Rx / DC Orders  ? ?ED Discharge Orders   ? ? None  ? ?  ? ? ? ?Note:  This document was prepared using Dragon voice recognition software and may include unintentional dictation errors. ?  ?Jene Every, MD ?07/01/21 1313 ? ?

## 2021-07-01 NOTE — Assessment & Plan Note (Addendum)
Patient underwent left-sided thoracentesis on 06/19/2021 with 1 L of fluid removed.  Chest x-ray on admission shows no pleural effusions or other acute cardiopulmonary abnormalities. ?Respiratory status stable. ?

## 2021-07-01 NOTE — Assessment & Plan Note (Signed)
Heart rate controlled on admission. ?Patient unfortunately suffered her stroke recently despite Eliquis. ?-- Hold Eliquis and amiodarone pending swallow evaluation and improved mentation ?-- Monitor on telemetry ?-- Monitor replace electrolytes ?

## 2021-07-01 NOTE — Assessment & Plan Note (Addendum)
Sodium 160 on admission due to profound dehydration.  Patient has reportedly had minimal p.o. intake since discharge to SNF on 3/31.  Hydrated with D5-1/2NS and improving. ?-- Now comfort, fluids stopped ?--No further labs ?

## 2021-07-01 NOTE — Assessment & Plan Note (Signed)
Mental status on admission much worse than his baseline per family, due to dehydration and renal failure with electrolyte abnormalities. ?-- Delirium precautions ?

## 2021-07-01 NOTE — Assessment & Plan Note (Addendum)
Chloride 119 on admission along with hypernatremia likely due to dehydration. ?

## 2021-07-01 NOTE — Assessment & Plan Note (Addendum)
Presents severely dehydrated and hypovolemic. ?Last echo March 2022 with EF 50 to 55% with severe LVH and indeterminate diastolic parameters ?

## 2021-07-01 NOTE — H&P (Addendum)
?History and Physical  ? ? ?Patient: Robert Frye WOE:[id number] DOB: April 06, 1931 ?DOA: 07/01/2021 ?DOS: the patient was seen and examined on 07/01/2021 ?PCP: Baxter Hire, MD  ?Patient coming from: SN[facility] ? ?Chief Complaint:  ?Chief Complaint  ?Patient presents with  ? Altered Mental Status  ? ?HPI: Robert Frye is a 88 y.o. male with medical history significant for A-fib on Eliquis s/p cardioversion 03/13/2020 and 06/11/2020, HFpEF (EF 50 to 55% 05/2020), chronically elevated troponin with intermediate risk Lexiscan 03/2020, CKD 3B, dementia, hypothyroidism, HTN, chronic dyspnea with history of pleural effusion s/p left thoracentesis (06/19/2021, 1 L removed), presented to the ED today from [facility] via EMS with altered mental status and severe dehydration.  Of note, patient was discharged to SNF on 06/22/2021 after admission for an acute ischemic stroke.  Due to altered mental status, patient is not able to provide history.  Wife and son are at bedside and reports that patient has not been eating or drinking at rehab, has become progressively more weak and less responsive.  This morning, wife reports that she found patient's fingertips or appearing blue, she expressed concern to staff and EMS was called to bring patient to the hospital.  Family also report patient's residual neurologic deficits include issues with swallowing which has worsened since discharge, and right-sided arm and leg weakness. ? ?ED course: Temp 97.4, pulse 78, respirations 23, BP 122/80, initial O2 sat 100% on room air.  Labs consistent with severe dehydration with sodium 160, chloride 119, BUN 113, creatinine 3.21 (up from 1.58 at discharge on 3/31), anion gap 16 but normal bicarb.  CBC consistent with hemoconcentration with hemoglobin of 18.2, mild leukocytosis WBC 12.6 and normal platelets.  INR 1.3.  Chest x-ray read as negative and reviewed by me, I agree. ? ? ? ?Review of Systems: unable to review all systems due to  the inability of the patient to answer questions. ?Past Medical History:  ?Diagnosis Date  ? (HFpEF) heart failure with preserved ejection fraction ([place])   ? CKD (chronic kidney disease), stage III ([place])   ? Dementia ([place])   ? History of stress test   ? a. 03/2020 MV: No ischemia/infarct. EF 40% (55-60% by echo 01/2020).  ? Hyperlipidemia   ? Hypertension   ? Hypertrophic cardiomyopathy ([place])   ? a. 01/2020 Echo: EF 55-60%, no rwma. Sev eccentric LVH of apical and basal-septal segments. gr1 DD. Mildly enlarged RV w/ nl RV fxn. sev dil LA, mod dil RA. Mild MR.  ? Hypothyroidism   ? Persistent atrial fibrillation ([place])   ? a. Dx 01/2020 s/p DCCV 02/2020; b. 02/2020 Recurrent Afib noted-->rate controlled-->amio started 05/2020 in setting of ongoing CHF; c. CHA2DS2VASc = 4-->Eliquis 2.5 BID.  ? Stroke [facility])   ? ?Past Surgical History:  ?Procedure Laterality Date  ? CARDIOVERSION N/A 03/14/2020  ? Procedure: CARDIOVERSION;  Surgeon: [place] Hampshire, MD;  Location: [facility] OR[facility];  Service: Cardiovascular;  Laterality: N/A;  ? CARDIOVERSION N/A 05/31/2020  ? Procedure: CARDIOVERSION;  Surgeon: Kate Sable, MD;  Location: [facility] OR[facility];  Service: Cardiovascular;  Laterality: N/A;  ? CATARACT EXTRACTION W/PHACO Right 03/13/2021  ? Procedure: CATARACT EXTRACTION PHACO AND INTRAOCULAR LENS PLACEMENT (IOC) RIGHT 20.48 01:55.4;  Surgeon: Birder Robson, MD;  Location: [facility];  Service: Ophthalmology;  Laterality: Right;  ? CATARACT EXTRACTION W/PHACO Left 04/03/2021  ? Procedure: CATARACT EXTRACTION PHACO AND INTRAOCULAR LENS PLACEMENT (IOC) LEFT 12.07 00:58.5;  Surgeon: Birder Robson, MD;  Location: [facility];  Service: Ophthalmology;  Laterality: Left;  ? HERNIA REPAIR    ? ?Social History:  reports that he has quit smoking. His smoking use included cigarettes. He has never used smokeless tobacco. He reports that he does not currently use alcohol. He reports that he does not use drugs. ? ?Allergies   ?Allergen Reactions  ? Jardiance [Empagliflozin] Other (See Comments)  ?  Dizziness/ weakness  ? ? ?Family History  ?Problem Relation Age of Onset  ? Heart attack Father   ? Heart disease Sister   ? Heart disease Brother   ? ? ?Prior to Admission medications   ?Medication Sig Start Date End Date Taking? Authorizing Provider  ?amiodarone (PACERONE) 100 MG tablet Take 1 tablet (100 mg total) by mouth daily. 08/31/20 08/26/21  Marrianne Mood D, PA-C  ?apixaban (ELIQUIS) 2.5 MG TABS tablet Take 1 tablet (2.5 mg total) by mouth 2 (two) times daily. 06/26/21   Enzo Bi, MD  ?atorvastatin (LIPITOR) 40 MG tablet Take 1 tablet (40 mg total) by mouth daily. 06/22/21   Enzo Bi, MD  ?Cholecalciferol (DIALYVITE VITAMIN D 5000) 125 MCG (5000 UT) capsule Take 2,000 Units by mouth daily.    [provider]  ?cyanocobalamin 1000 MCG tablet Take 1,000 mcg by mouth daily.    [provider]  ?feeding supplement (ENSURE ENLIVE / ENSURE PLUS) LIQD Take 237 mLs by mouth 3 (three) times daily between meals. 06/22/21   Enzo Bi, MD  ?furosemide (LASIX) 20 MG tablet Take 1 tablet (20 mg total) by mouth daily. 05/29/21 07/28/21  [place] Hampshire, MD  ?levothyroxine (SYNTHROID) 75 MCG tablet Take 75 mcg by mouth daily before breakfast. 11/12/14   [provider]  ?Multiple Vitamin (MULTIVITAMIN WITH MINERALS) TABS tablet Take 1 tablet by mouth daily. 06/23/21   Enzo Bi, MD  ?Naphazoline HCl (CLEAR EYES OP) Place 1 drop into both eyes daily as needed (dry/itchy eyes).    [provider]  ?potassium chloride SA (KLOR-CON M20) 20 MEQ tablet Take 1 tablet (20 mEq total) by mouth daily. 08/31/20 08/26/21  Marrianne Mood D, PA-C  ? ? ?Physical Exam: ?Vitals:  ? 07/01/21 1430 07/01/21 1500 07/01/21 1530 07/01/21 1639  ?BP: 129/81 136/76 127/73 109/67  ?Pulse: 73  70 72  ?Resp: (!) $RemoveBe'22 20 16 18  'GPiqjdrzX$ ?Temp:    97.6 ?F (36.4 ?C)  ?TempSrc:      ?SpO2: 98%  99% 100%  ?Weight:      ?Height:      ? ?General exam: no acute  distress frail and underweight, awake but poorly responsive requiring tactile stimulation ?HEENT: Keeps eyes closed, dry mucus membranes, hard of hearing ?Respiratory system: CTAB, no wheezes, rales or rhonchi, normal respiratory effort. ?Cardiovascular system: normal S1/S2, RRR, no JVD, murmurs, rubs, gallops, no pedal edema.   ?Gastrointestinal system: soft, NT, ND, no HSM felt, +bowel sounds. ?Central nervous system: Evaluation limited by patient's current mental status, does follow commands (wiggles left toes, did not wiggle right toes) ?Extremities: No cyanosis, no edema ?Skin: Extremely dry with very poor skin turgor, patchy ecchymosis of the bilateral upper extremities ?Psychiatry: normal mood, congruent affect, judgement and insight appear normal ? ? ?Data Reviewed: ? ?Labs reviewed and notable for sodium 160, chloride 119, glucose 106, BUN 113, creatinine 3.21, calcium 10.5, anion gap 16, alk phos 137, AST 54 with normal ALT, total protein 8.4, total bili 1.6, GFR 18.  CBC with WBC 12.6, hemoglobin 18.2, normal platelets, PT 16.1, INR 1.3. ? ?Chest  x-ray with no acute cardiopulmonary findings. ? ?Assessment and Plan: ? ? ? ?* Acute renal failure (ARF) ([place]) ?Due to severe dehydration, creatinine 3.21 on admission, up from 1.58 at time of discharge on 3/31. ?-- IV hydration ?-- Follow BMP ?-- Avoid nephrotoxins and renally dose meds ?-- Maintain MAP above 65 ? ?Chronic anticoagulation ?On Eliquis for A-fib ? ?Dementia without behavioral disturbance ([place]) ?Mental status on admission much worse than his baseline per family, due to dehydration and renal failure with electrolyte abnormalities. ?-- Delirium precautions ? ?Severe dehydration ?Due to recent stroke and dysphagia, patient has had minimal p.o. intake since discharge 3/31 to SNF.  Presents with acute renal failure and profound hypernatremia. ?-- Rehydrating ?-- Follow renal function and electrolytes ? ?(HFpEF) heart failure with preserved ejection  fraction ([place]) ?Presents severely dehydrated and hypovolemic. ?Last echo March 2022 with EF 50 to 55% with severe LVH and indeterminate diastolic parameters ? ?Monitor volume status with IV fluids. ? ?Hyperchloremia

## 2021-07-01 NOTE — Assessment & Plan Note (Addendum)
Presented with altered mental status superimposed on baseline dementia.  Secondary to severe dehydration, renal failure and electrolyte derangements. ?-- Delirium precautions ?

## 2021-07-01 NOTE — Assessment & Plan Note (Addendum)
Creatinine on discharge 06/22/2021 was 1.58. ?Creatinine on admission is 3.21 with BUN 113. ?Renal function improved with hydration. ?--No further labs ?

## 2021-07-01 NOTE — Assessment & Plan Note (Addendum)
Superimposed on CKD stage IIIb. ?See acute renal failure ?

## 2021-07-01 NOTE — Assessment & Plan Note (Addendum)
Patient was just discharged on 06/12/2021 after admission for an acute ischemic stroke despite taking Eliquis for his A-fib.  Residual deficits include dysphagia and right-sided weakness. ? ?Family report dysphagia seems worse since the time of discharge.   ?-- Hold Eliquis and Lipitor  ?

## 2021-07-01 NOTE — Progress Notes (Signed)
SLP Cancellation Note ? ?Patient Details ?Name: Robert Frye ?MRN: [id number] ?DOB: March 24, 1932 ? ? ?Cancelled treatment:       Reason Eval/Treat Not Completed: Fatigue/lethargy limiting ability to participate  ? ?SLP consult received and appreciated. Chart review compelted. Per chart review (H&P), pt "poorly responsive requiring tactile stimulation." Given reduced LOA, pt unable to participate safely in clinical swallowing evaluation.  ? ?Will defer clinical swallowing evaluation pending improvements in LOA. Will plan to check on pt next date. ? ?Clyde Canterbury, M.S., CCC-SLP ?Speech-Language Pathologist ?[facility] [facility] ?[phone] (ASCOM)  ? ?Alessandra Bevels Jolaine Fryberger ?07/01/2021, 1:37 PM ?

## 2021-07-01 NOTE — ED Notes (Signed)
Pt bolus still running, O2 sensor changed to nose with good pleth ?

## 2021-07-01 NOTE — ED Triage Notes (Signed)
Pt arrives via EMS from [facility] for AMS- pt has been deteriorating since he got there a week ago- pt was admitted recently for a stroke- pt not responsive to verbal stimuli- cbg 153- VSS per EMS ?

## 2021-07-01 NOTE — Assessment & Plan Note (Addendum)
Due to recent stroke and dysphagia, patient has had minimal p.o. intake since discharge 3/31 to SNF.  Presents with acute renal failure and profound hypernatremia. ?-- Rehydrating, but fluids stopped today with transition to comfort care ? ?

## 2021-07-01 NOTE — Progress Notes (Signed)
No tele boxes available on floor for patient. MD made aware.  ?

## 2021-07-01 NOTE — Assessment & Plan Note (Addendum)
Was on Eliquis for A-fib ?

## 2021-07-01 NOTE — Plan of Care (Signed)
  Problem: Education: Goal: Ability to demonstrate management of disease process will improve Outcome: Progressing Goal: Ability to verbalize understanding of medication therapies will improve Outcome: Progressing Goal: Individualized Educational Video(s) Outcome: Progressing   Problem: Activity: Goal: Capacity to carry out activities will improve Outcome: Progressing   

## 2021-07-01 NOTE — Assessment & Plan Note (Addendum)
Heart rate controlled on admission. ?Patient unfortunately suffered her stroke recently despite Eliquis. ?-- Hold Eliquis and amiodarone ? ?

## 2021-07-01 NOTE — Assessment & Plan Note (Addendum)
Due to severe dehydration, creatinine 3.21 on admission, up from 1.58 at time of discharge on 3/31.  Renal function improving with hydration.  Now on comfort care. ?No further labs. ?

## 2021-07-02 DIAGNOSIS — N179 Acute kidney failure, unspecified: Secondary | ICD-10-CM | POA: Diagnosis not present

## 2021-07-02 DIAGNOSIS — I5032 Chronic diastolic (congestive) heart failure: Secondary | ICD-10-CM

## 2021-07-02 DIAGNOSIS — F0390 Unspecified dementia without behavioral disturbance: Secondary | ICD-10-CM

## 2021-07-02 DIAGNOSIS — Z7189 Other specified counseling: Secondary | ICD-10-CM

## 2021-07-02 DIAGNOSIS — E43 Unspecified severe protein-calorie malnutrition: Secondary | ICD-10-CM | POA: Diagnosis present

## 2021-07-02 DIAGNOSIS — R627 Adult failure to thrive: Secondary | ICD-10-CM

## 2021-07-02 DIAGNOSIS — E860 Dehydration: Secondary | ICD-10-CM

## 2021-07-02 DIAGNOSIS — Z8673 Personal history of transient ischemic attack (TIA), and cerebral infarction without residual deficits: Secondary | ICD-10-CM

## 2021-07-02 DIAGNOSIS — F03C0 Unspecified dementia, severe, without behavioral disturbance, psychotic disturbance, mood disturbance, and anxiety: Secondary | ICD-10-CM

## 2021-07-02 DIAGNOSIS — R1310 Dysphagia, unspecified: Secondary | ICD-10-CM | POA: Diagnosis not present

## 2021-07-02 DIAGNOSIS — R739 Hyperglycemia, unspecified: Secondary | ICD-10-CM | POA: Diagnosis not present

## 2021-07-02 DIAGNOSIS — Z66 Do not resuscitate: Secondary | ICD-10-CM | POA: Diagnosis not present

## 2021-07-02 DIAGNOSIS — Z515 Encounter for palliative care: Secondary | ICD-10-CM

## 2021-07-02 DIAGNOSIS — E870 Hyperosmolality and hypernatremia: Secondary | ICD-10-CM

## 2021-07-02 LAB — CBC
HCT: 45.2 % (ref 39.0–52.0)
Hemoglobin: 14 g/dL (ref 13.0–17.0)
MCH: 33.7 pg (ref 26.0–34.0)
MCHC: 31 g/dL (ref 30.0–36.0)
MCV: 108.9 fL — ABNORMAL HIGH (ref 80.0–100.0)
Platelets: 240 10*3/uL (ref 150–400)
RBC: 4.15 MIL/uL — ABNORMAL LOW (ref 4.22–5.81)
RDW: 14.6 % (ref 11.5–15.5)
WBC: 8.2 10*3/uL (ref 4.0–10.5)
nRBC: 0 % (ref 0.0–0.2)

## 2021-07-02 LAB — GLUCOSE, CAPILLARY
Glucose-Capillary: 105 mg/dL — ABNORMAL HIGH (ref 70–99)
Glucose-Capillary: 108 mg/dL — ABNORMAL HIGH (ref 70–99)
Glucose-Capillary: 128 mg/dL — ABNORMAL HIGH (ref 70–99)
Glucose-Capillary: 136 mg/dL — ABNORMAL HIGH (ref 70–99)
Glucose-Capillary: 39 mg/dL — CL (ref 70–99)

## 2021-07-02 LAB — COMPREHENSIVE METABOLIC PANEL
ALT: 26 U/L (ref 0–44)
AST: 48 U/L — ABNORMAL HIGH (ref 15–41)
Albumin: 2.9 g/dL — ABNORMAL LOW (ref 3.5–5.0)
Alkaline Phosphatase: 100 U/L (ref 38–126)
Anion gap: 10 (ref 5–15)
BUN: 93 mg/dL — ABNORMAL HIGH (ref 8–23)
CO2: 25 mmol/L (ref 22–32)
Calcium: 8.6 mg/dL — ABNORMAL LOW (ref 8.9–10.3)
Chloride: 119 mmol/L — ABNORMAL HIGH (ref 98–111)
Creatinine, Ser: 2.54 mg/dL — ABNORMAL HIGH (ref 0.61–1.24)
GFR, Estimated: 24 mL/min — ABNORMAL LOW (ref >60)
Glucose, Bld: 482 mg/dL — ABNORMAL HIGH (ref 70–99)
Potassium: 3.5 mmol/L (ref 3.5–5.1)
Sodium: 154 mmol/L — ABNORMAL HIGH (ref 135–145)
Total Bilirubin: 1.3 mg/dL — ABNORMAL HIGH (ref 0.3–1.2)
Total Protein: 6.5 g/dL (ref 6.5–8.1)

## 2021-07-02 LAB — GLUCOSE, RANDOM: Glucose, Bld: 493 mg/dL — ABNORMAL HIGH (ref 70–99)

## 2021-07-02 MED ORDER — DEXTROSE 50 % IV SOLN
INTRAVENOUS | Status: AC
  Filled 2021-07-02: qty 50

## 2021-07-02 MED ORDER — ADULT MULTIVITAMIN W/MINERALS CH: 1.0000 | ORAL_TABLET | Freq: Every day | ORAL | Status: DC

## 2021-07-02 MED ORDER — DEXTROSE 50 % IV SOLN
25.0000 g | INTRAVENOUS | Status: AC
  Administered 2021-07-02: 25 g via INTRAVENOUS

## 2021-07-02 MED ORDER — ENSURE ENLIVE PO LIQD
237.0000 mL | Freq: Three times a day (TID) | ORAL | Status: DC
  Administered 2021-07-04: 237 mL via ORAL

## 2021-07-02 MED ORDER — INSULIN ASPART 100 UNIT/ML IJ SOLN: 0.0000 [IU] | Freq: Three times a day (TID) | INTRAMUSCULAR | Status: DC

## 2021-07-02 MED ORDER — INSULIN ASPART 100 UNIT/ML IJ SOLN
0.0000 [IU] | INTRAMUSCULAR | Status: DC
  Administered 2021-07-02: 1 [IU] via SUBCUTANEOUS
  Filled 2021-07-02: qty 1

## 2021-07-02 MED ORDER — MORPHINE SULFATE (PF) 2 MG/ML IV SOLN
1.0000 mg | INTRAVENOUS | Status: DC | PRN
  Administered 2021-07-03: 1 mg via INTRAVENOUS
  Filled 2021-07-02: qty 1

## 2021-07-02 NOTE — Hospital Course (Signed)
Robert Frye is a 88 y.o. male with medical history significant for A-fib on Eliquis s/p cardioversion 03/13/2020 and 06/11/2020, HFpEF (EF 50 to 55% 05/2020), chronically elevated troponin with intermediate risk Lexiscan 03/2020, CKD 3B, dementia, hypothyroidism, HTN, chronic dyspnea with history of pleural effusion s/p left thoracentesis (06/19/2021, 1 L removed), presented to the ED today from [facility] via EMS with altered mental status and severe dehydration.  Of note, patient was discharged to SNF on 06/22/2021 after admission for an acute ischemic stroke.   ? ?Admitted with acute renal failure, hypernatremia, severe dehydration, and acute metabolic encephalopathy. ?

## 2021-07-02 NOTE — Consult Note (Signed)
? ?                                                                                ?Consultation Note ?Date: 07/02/2021  ? ?Patient Name: Robert Frye  ?DOB: Apr 26, 1931  MRN: [id number]  Age / Sex: 88 y.o., male  ?PCP: Robert Hire, MD ?Referring Physician: Ezekiel Slocumb, DO ? ?Reason for Consultation: Establishing goals of care and Psychosocial/spiritual support ? ?HPI/Patient Profile: 88 y.o. male   admitted on 07/01/2021 with past  ?medical history significant for A-fib on Eliquis s/p cardioversion 03/13/2020 and 06/11/2020, HFpEF (EF 50 to 55% 05/2020), chronically elevated troponin, CKD, dementia, hypothyroidism, HTN, chronic dyspnea with history of pleural effusion s/p left thoracentesis (06/19/2021, 1 L removed), presented to the ED today from [facility] via EMS with altered mental status and severe dehydration.   ? ?Of note, patient was discharged to SNF on 06/22/2021 after admission for an acute ischemic stroke.   Wife and son report that patient has not been eating or drinking at rehab, has become progressively more weak and less responsive.   ?  ?ED course: Temp 97.4, pulse 78, respirations 23, BP 122/80, initial O2 sat 100% on room air.  Labs consistent with severe dehydration with sodium 160, chloride 119, BUN 113, creatinine 3.21 (up from 1.58 at discharge on 3/31), CBC consistent with hemoconcentration with hemoglobin of 18.2, mild leukocytosis WBC 12.6 and normal platelets.  INR 1.3.  Chest x-ray read as negative and reviewed by me, I agree. ? ?Family report continued physical and functional decline over the past several years.  Patient does not have medical decision-making capacity at this time.  Family face treatment option decisions, advanced directive decisions and anticipatory care needs. ?  ?Clinical Assessment and Goals of Care: ? ? ?This NP Robert Frye reviewed medical records, received report from team, assessed the patient and then meet at the patient's bedside along with his wife  and 2 sons, daughter-in-law Robert Frye who is a nurse in the cancer center also present, to discuss diagnosis, prognosis, GOC, EOL wishes disposition and options. ?  ?Concept of Palliative Care was introduced as specialized medical care for people and their families living with serious illness.  If focuses on providing relief from the symptoms and stress of a serious illness.  The goal is to improve quality of life for both the patient and the family. ?Values and goals of care important to patient and family were attempted to be elicited. ? ?Created space and opportunity for family to explore thoughts and feelings regarding current medical situation.  All present expressed their love and appreciation for Robert Frye.  All present verbalize and acknowledge that patient has had a continued significant decline over the past months and more noticeably the last few weeks.. ? ?The last week has been an unsuccessful attempt at rehab/[facility] as patient has been unable to support himself from a nutrition and hydration standpoint. ? ?Education offered on concept specific to adult failure to thrive and the limitations of medical interventions to prolong quality of life when the body fails to thrive. ? ?Education offered on the concept of dysphagia as it relates to dementia and overall  weakness.  Education offered on the risks and benefits of artificial feeding and hydration. ?  ?A  discussion was had today regarding advanced directives.  Concepts specific to code status, artifical feeding and hydration, continued IV antibiotics and rehospitalization was had.   ? ?      (Wife very clearly and adamantly knows that the patient would never want feeding tube) ? ?The difference between a aggressive medical intervention path  and a palliative comfort care path for this patient at this time was had.  ? ?Education offered on hospice benefit; philosophy and eligibility specific to home versus skilled nursing facility versus  residential hospice facility. ? ?Education offered on the natural trajectory and expectations at end-of-life. ? ? Questions and concerns addressed.  Family encouraged to call with questions or concerns.   ?  ?PMT will continue to support holistically. ?  ?  ?  ?Patient's wife is making decision decision maker supported by her 2 sons.  Wife tells me that they do have documents, I encouraged her to bring them in for scanning ? ?SUMMARY OF RECOMMENDATIONS   ? ?Code Status/Advance Care Planning: ?DNR ? ? ?Palliative Prophylaxis:  ?Aspiration, Bowel Regimen, Delirium Protocol, Frequent Pain Assessment, and Oral Care ? ?Additional Recommendations (Limitations, Scope, Preferences): ?No Artificial Feeding ? ?Psycho-social/Spiritual:  ?Desire for further Chaplaincy support:no--family wishes to contact their community-based pastor ?Additional Recommendations: Education on Hospice ? ?Prognosis:  ?Long-term prognosis is poor, more specific prognosis will be determined by decisions for life-prolonging measures. ? ?Discharge Planning: To Be Determined  ? ?  ? ?Primary Diagnoses: ?Present on Admission: ? Acute renal failure (ARF) ([place]) ? (HFpEF) heart failure with preserved ejection fraction ([place]) ? AKI (acute kidney injury) ([place]) ? Essential hypertension ? Dementia without behavioral disturbance ([place]) ? Hypothyroidism ? Persistent atrial fibrillation ([place]) ? Recurrent pleural effusion on left ? Stage 3b chronic kidney disease ([place]) ? Hypernatremia ? Hyperchloremia ? Severe dehydration ? Acute metabolic encephalopathy ? ? ?I have reviewed the medical record, interviewed the patient and family, and examined the patient. The following aspects are pertinent. ? ?Past Medical History:  ?Diagnosis Date  ? (HFpEF) heart failure with preserved ejection fraction ([place])   ? CKD (chronic kidney disease), stage III ([place])   ? Dementia ([place])   ? History of stress test   ? a. 03/2020 MV: No ischemia/infarct. EF 40% (55-60% by echo 01/2020).  ?  Hyperlipidemia   ? Hypertension   ? Hypertrophic cardiomyopathy ([place])   ? a. 01/2020 Echo: EF 55-60%, no rwma. Sev eccentric LVH of apical and basal-septal segments. gr1 DD. Mildly enlarged RV w/ nl RV fxn. sev dil LA, mod dil RA. Mild MR.  ? Hypothyroidism   ? Persistent atrial fibrillation ([place])   ? a. Dx 01/2020 s/p DCCV 02/2020; b. 02/2020 Recurrent Afib noted-->rate controlled-->amio started 05/2020 in setting of ongoing CHF; c. CHA2DS2VASc = 4-->Eliquis 2.5 BID.  ? Stroke [facility])   ? ?Social History  ? ?Socioeconomic History  ? Marital status: Married  ?  Spouse name: Not on file  ? Number of children: Not on file  ? Years of education: Not on file  ? Highest education level: Not on file  ?Occupational History  ? Not on file  ?Tobacco Use  ? Smoking status: Former  ?  Types: Cigarettes  ? Smokeless tobacco: Never  ? Tobacco comments:  ?  Smoked as a teenager  ?Vaping Use  ? Vaping Use: Never used  ?Substance and Sexual Activity  ? Alcohol  use: Not Currently  ? Drug use: Never  ? Sexual activity: Not on file  ?Other Topics Concern  ? Not on file  ?Social History Narrative  ? Not on file  ? ?Social Determinants of Health  ? ?Financial Resource Strain: Not on file  ?Food Insecurity: Not on file  ?Transportation Needs: Not on file  ?Physical Activity: Not on file  ?Stress: Not on file  ?Social Connections: Not on file  ? ?Family History  ?Problem Relation Age of Onset  ? Heart attack Father   ? Heart disease Sister   ? Heart disease Brother   ? ?Scheduled Meds: ? heparin  5,000 Units Subcutaneous Q8H  ? insulin aspart  0-9 Units Subcutaneous Q4H  ? ?Continuous Infusions: ? dextrose 5 % and 0.45% NaCl 100 mL/hr at 07/01/21 1600  ? ?PRN Meds:.acetaminophen **OR** acetaminophen, ondansetron **OR** ondansetron (ZOFRAN) IV, polyvinyl alcohol ?Medications Prior to Admission:  ?Prior to Admission medications   ?Medication Sig Start Date End Date Taking? Authorizing Provider  ?amiodarone (PACERONE) 100 MG tablet Take 1  tablet (100 mg total) by mouth daily. 08/31/20 08/26/21 Yes Visser, Jacquelyn D, PA-C  ?apixaban (ELIQUIS) 2.5 MG TABS tablet Take 1 tablet (2.5 mg total) by mouth 2 (two) times daily. 06/26/21  Yes Enzo Bi, MD  ?Trudee Grip

## 2021-07-02 NOTE — Progress Notes (Signed)
Nutrition Initial Assessment  ? ?DOCUMENTATION CODES:  ? ?Severe malnutrition in context of chronic illness ? ?INTERVENTION:  ? ?Ensure Enlive po TID, each supplement provides 350 kcal and 20 grams of protein. ? ?Magic cup TID with meals, each supplement provides 290 kcal and 9 grams of protein ? ?MVI po daily  ? ?Pt at high refeed risk; recommend monitor potassium, magnesium and phosphorus labs daily until stable ? ?NUTRITION DIAGNOSIS:  ? ?Severe Malnutrition related to chronic illness (dementia, stroke, CKD) as evidenced by severe fat depletion, severe muscle depletion. ? ?GOAL:  ? ?Patient will meet greater than or equal to 90% of their needs ? ?MONITOR:  ? ?PO intake, Supplement acceptance, Labs, Weight trends, Skin, I & O's ? ?REASON FOR ASSESSMENT:  ? ?Malnutrition Screening Tool ?  ? ?ASSESSMENT:  ? ?88 y.o. male with medical history significant for A-fib on Eliquis s/p cardioversion 03/13/2020 and 06/11/2020, HFpEF (EF 50 to 55% 05/2020), chronically elevated troponin with intermediate risk Lexiscan 03/2020, CKD 3B, dementia, hypothyroidism, stroke, HTN and chronic dyspnea with history of pleural effusion s/p left thoracentesis (06/19/2021, 1 L removed) who is admitted with ARF. ? ?Met with pt and family in room today. Pt unable to provide any nutrition related history so history provided by family at bedside. Pt is known to nutrition department from his recent previous admit. Family reports that patient has not been eating much of anything since discharging. On NFPE today, pt has progressed from moderate to severe malnutrition since his last admission. Pt initiated on a dysphagia 3 diet today but has been too lethargic to eat. Family reports that pt did ask for some water and coffee. SLP evaluation pending. Family reports that pt loves ice cream. RD will add supplements and MVI to help pt meet his estimated needs. Pt is at high refeed risk. Per chart, pt appears weight stable pta. Palliative care following;  family does not want feeding tube.  ? ?Medications reviewed and include: heparin, insulin, NaCl w/ 5% dextrose _0 /hr ? ?Labs reviewed: Na 154(H), K 3.5 wnl, BUN 93(H), creat 2.54(H) ?Cbgs- 108, 105 x 24 hrs ? ?NUTRITION - FOCUSED PHYSICAL EXAM: ? ?Flowsheet Row Most Recent Value  ?Orbital Region Moderate depletion  ?Upper Arm Region Severe depletion  ?Thoracic and Lumbar Region Moderate depletion  ?Buccal Region Moderate depletion  ?Temple Region Severe depletion  ?Clavicle Bone Region Severe depletion  ?Clavicle and Acromion Bone Region Severe depletion  ?Scapular Bone Region Severe depletion  ?Dorsal Hand Severe depletion  ?Patellar Region Severe depletion  ?Anterior Thigh Region Severe depletion  ?Posterior Calf Region Severe depletion  ?Edema (RD Assessment) None  ?Hair Reviewed  ?Eyes Reviewed  ?Mouth Reviewed  ?Skin Reviewed  ?Nails Reviewed  ? ?Diet Order:   ?Diet Order   ? ?       ?  DIET DYS 3 Room service appropriate? Yes; Fluid consistency: Thin  Diet effective now       ?  ? ?  ?  ? ?  ? ?EDUCATION NEEDS:  ? ?Not appropriate for education at this time ? ?Skin:  Skin Assessment: Reviewed RN Assessment (skin tears) ? ?Last BM:  4/9 ? ?Height:  ? ?Ht Readings from Last 1 Encounters:  ?07/02/21 _1  (1.676 m)  ? ? ?Weight:  ? ?Wt Readings from Last 1 Encounters:  ?07/02/21 60 kg  ? ? ?Ideal Body Weight:  64.5 kg ? ?BMI:  Body mass index is 21.35 kg/m?. ? ?Estimated Nutritional Needs:  ? ?Kcal:  1700-1900kcal/day ? ?  Protein:  85-95g/day ? ?Fluid:  1.5-1.7L/day ? ?Koleen Distance MS, RD, LDN ?Please refer to [facility] for RD and/or RD on-call/weekend/after hours pager ? ?

## 2021-07-02 NOTE — Assessment & Plan Note (Addendum)
Recent stroke in elderly gentleman with dementia at baseline, now with dysphagia.  ?Admitted with severe dehydration and AMS. ?--Palliative consulted ? ?4/10: Palliative and myself discussed patient's prognosis with family extensively.  Allowing time to see if any improvement in AMS once electrolytes and renal function better. ? ?4/11: AM transitioned to comfort care ?

## 2021-07-02 NOTE — Assessment & Plan Note (Signed)
Due to dextrose in fluids being given for AKI and hypernatremia. ?--Sliding scale novolog and CBG's ordered ?

## 2021-07-02 NOTE — Progress Notes (Addendum)
?Progress Note ? ? ?Patient: Robert Frye [id number] DOB: 03-Mar-1932 DOA: 07/01/2021     1 ?DOS: the patient was seen and examined on 07/02/2021 ?  ?Brief hospital course: ?Robert Frye is a 88 y.o. male with medical history significant for A-fib on Eliquis s/p cardioversion 03/13/2020 and 06/11/2020, HFpEF (EF 50 to 55% 05/2020), chronically elevated troponin with intermediate risk Lexiscan 03/2020, CKD 3B, dementia, hypothyroidism, HTN, chronic dyspnea with history of pleural effusion s/p left thoracentesis (06/19/2021, 1 L removed), presented to the ED today from [facility] via EMS with altered mental status and severe dehydration.  Of note, patient was discharged to SNF on 06/22/2021 after admission for an acute ischemic stroke.   ? ?Admitted with acute renal failure, hypernatremia, severe dehydration, and acute metabolic encephalopathy. ? ?Assessment and Plan: ?* Acute renal failure (ARF) ([place]) ?Due to severe dehydration, creatinine 3.21 on admission, up from 1.58 at time of discharge on 3/31.  Renal function improving with hydration. ?Cr trend: 3.21 >> 2.54 ?-- IV hydration ?-- Follow BMP ?-- Avoid nephrotoxins and renally dose meds ?-- Maintain MAP above 65 ? ?Chronic anticoagulation ?On Eliquis for A-fib ? ?Dementia without behavioral disturbance ([place]) ?Mental status on admission much worse than his baseline per family, due to dehydration and renal failure with electrolyte abnormalities. ?-- Delirium precautions ? ?Severe dehydration ?Due to recent stroke and dysphagia, patient has had minimal p.o. intake since discharge 3/31 to SNF.  Presents with acute renal failure and profound hypernatremia. ?-- Rehydrating ?-- Follow renal function and electrolytes ? ?(HFpEF) heart failure with preserved ejection fraction ([place]) ?Presents severely dehydrated and hypovolemic. ?Last echo March 2022 with EF 50 to 55% with severe LVH and indeterminate diastolic parameters ? ?Monitor volume status with IV  fluids. ? ?Hyperchloremia ?Chloride 119 on admission along with hypernatremia likely due to dehydration.  Follow BMP.  Rehydrating. ? ?Hypernatremia ?Sodium 160 on admission due to profound dehydration.  Patient has reportedly had minimal p.o. intake since discharge to SNF on 3/31 ?-- Given 1 L normal saline bolus in the ED ?-- Continue D5 1/2-NS at 100 cc/hr ?-- Monitor BMP ?-- Pending SLP eval, if patient can safely drink water encouraged this frequently ? ?4/10: Na 154 from 160 ? ?History of stroke ?Patient was just discharged on 06/12/2021 after admission for an acute ischemic stroke despite taking Eliquis for his A-fib.  Residual deficits include dysphagia and right-sided weakness. ? ?Family report dysphagia seems worse since the time of discharge.   ?-- Hold Eliquis and Lipitor for now ? ?Recurrent pleural effusion on left ?Patient underwent left-sided thoracentesis on 06/19/2021 with 1 L of fluid removed.  Chest x-ray on admission shows no pleural effusions or other acute cardiopulmonary abnormalities. ?-- Monitor with IV hydration ? ?Persistent atrial fibrillation ([place]) ?Heart rate controlled on admission. ?Patient unfortunately suffered her stroke recently despite Eliquis. ?-- Hold Eliquis and amiodarone pending swallow evaluation and improved mentation ?-- Monitor on telemetry ?-- Monitor replace electrolytes ? ? ?Stage 3b chronic kidney disease ([place]) ?Creatinine on discharge 06/22/2021 was 1.58. ?Creatinine on admission is 3.21 with BUN 113. ?-- Hydrating with D5 half-normal saline given also hypernatremic ?-- Follow BMP ?-- See acute renal failure ? ?Essential hypertension ?Blood pressures stable on admission. ?Takes 20 mg Lasix daily. ?-- Hold Lasix due to AKI and dehydration ?-- Monitor BP ? ?Paroxysmal atrial fibrillation ([facility]) ?Heart rate controlled on admission. ?Patient unfortunately suffered her stroke recently despite Eliquis. ?-- Hold Eliquis and amiodarone pending swallow evaluation and improved  mentation ?-- Monitor on telemetry ?-- Monitor replace electrolytes ? ?Hypothyroidism ?Levothyroxine on hold pending speech evaluation ? ?Hyperglycemia ?Due to dextrose in fluids being given for AKI and hypernatremia. ?--Sliding scale novolog and CBG's ordered ? ?Goals of care, counseling/discussion ?Recent stroke in elderly gentleman with dementia at baseline, now with dysphagia.  ?Admitted with severe dehydration and AMS. ?--Palliative consulted ? ?4/10: Palliative and myself discussed patient's prognosis with family extensively.  Allowing time to see if any improvement in AMS once electrolytes and renal function better. ? ?Protein-calorie malnutrition, severe ?Due to to chronic illness (dementia, stroke, CKD) as evidenced by severe fat depletion, severe muscle depletion. ?--Dietitian following, appreciate recs ?--Magic cup TID, Ensure drinks, MVI ? ?Acute metabolic encephalopathy ?Presented with altered mental status superimposed on baseline dementia.  Secondary to severe dehydration, renal failure and electrolyte derangements. ?-- Manage underlying issues as outlined ?-- Delirium precautions ?-- Monitor closely ? ?AKI (acute kidney injury) ([place]) ?Superimposed on CKD stage IIIb. ?See acute renal failure ? ? ? ? ?  ? ?Subjective: Pt remains altered, minimally interactive.  Wife, two sons and daughter in law at bedside this AM.  Wife reports he woke up very briefly earlier, when she asked if he was in pain he answered no.  Palliative had just been in.  We continued discussions about prognosis, goals of care.   ?Family to continue discussions amongst themselves, but hopeful to see improvement once labs are better. ? ?Physical Exam: ?Vitals:  ? 07/02/21 1301 07/02/21 1500 07/02/21 1514 07/02/21 1538  ?BP: (!) 105/58 (!) 114/58    ?Pulse: (!) 58 (!) 47 (!) 59   ?Resp: 14 16    ?Temp:  98.4 ?F (36.9 ?C)    ?TempSrc:      ?SpO2: 98% 100%    ?Weight:    60 kg  ?Height:      ? ?General exam: sleeping comfortable,  intermittently wakes very briefly, no acute distress ?HEENT: dry mucus membranes, eyes closed ?Respiratory system: CTAB, no wheezes, rales or rhonchi, normal respiratory effort. ?Cardiovascular system: normal S1/S2, RRR, no pedal edema.   ?Gastrointestinal system: soft, non-tender. ?Central nervous system: unable to evaluate due to pt condition ?Extremities: no edema, normal tone ?Skin: dry, intact, normal temperature ?Psychiatry: unable to evaluate due to pt condition ? ? ?Data Reviewed: ? ?Notable Labs: Na 154, Cl 119, glucose 482, BUN 93, Cr 2.54, Ca 8.6, Albumin 2.9, AST 48, Tbili 1.3, GFR 24, MCV 108.9 ? ?Family Communication: multiple family members at bedside on rounds today ? ?Disposition: ?Status is: Inpatient ?Remains inpatient appropriate because: remains encephalopathic, no PO intake, ongoing renal failure and electrolyte derangements being corrected. ? ? Planned Discharge Destination: Skilled nursing facility ? ? ? ?Time spent: 45 minutes ? ?Author: ?Ezekiel Slocumb, DO ?07/02/2021 7:49 PM ? ?For on call review [email or web address].  ?

## 2021-07-02 NOTE — Assessment & Plan Note (Signed)
Due to to chronic illness (dementia, stroke, CKD) as evidenced by severe fat depletion, severe muscle depletion. ?--Dietitian following, appreciate recs ?--Magic cup TID, Ensure drinks, MVI ?

## 2021-07-03 DIAGNOSIS — Z515 Encounter for palliative care: Secondary | ICD-10-CM

## 2021-07-03 DIAGNOSIS — G9341 Metabolic encephalopathy: Secondary | ICD-10-CM | POA: Diagnosis not present

## 2021-07-03 DIAGNOSIS — F0390 Unspecified dementia without behavioral disturbance: Secondary | ICD-10-CM | POA: Diagnosis not present

## 2021-07-03 DIAGNOSIS — R627 Adult failure to thrive: Secondary | ICD-10-CM | POA: Diagnosis not present

## 2021-07-03 LAB — PHOSPHORUS: Phosphorus: 2.1 mg/dL — ABNORMAL LOW (ref 2.5–4.6)

## 2021-07-03 LAB — GLUCOSE, CAPILLARY
Glucose-Capillary: 111 mg/dL — ABNORMAL HIGH (ref 70–99)
Glucose-Capillary: 134 mg/dL — ABNORMAL HIGH (ref 70–99)
Glucose-Capillary: 95 mg/dL (ref 70–99)

## 2021-07-03 LAB — BASIC METABOLIC PANEL
Anion gap: 6 (ref 5–15)
BUN: 68 mg/dL — ABNORMAL HIGH (ref 8–23)
CO2: 25 mmol/L (ref 22–32)
Calcium: 8.3 mg/dL — ABNORMAL LOW (ref 8.9–10.3)
Chloride: 121 mmol/L — ABNORMAL HIGH (ref 98–111)
Creatinine, Ser: 2.1 mg/dL — ABNORMAL HIGH (ref 0.61–1.24)
GFR, Estimated: 30 mL/min — ABNORMAL LOW (ref >60)
Glucose, Bld: 334 mg/dL — ABNORMAL HIGH (ref 70–99)
Potassium: 2.8 mmol/L — ABNORMAL LOW (ref 3.5–5.1)
Sodium: 152 mmol/L — ABNORMAL HIGH (ref 135–145)

## 2021-07-03 LAB — MAGNESIUM: Magnesium: 2.6 mg/dL — ABNORMAL HIGH (ref 1.7–2.4)

## 2021-07-03 MED ORDER — POTASSIUM PHOSPHATES 15 MMOLE/5ML IV SOLN: 15.0000 mmol | Freq: Once | INTRAVENOUS | Status: DC

## 2021-07-03 MED ORDER — POTASSIUM CHLORIDE 10 MEQ/100ML IV SOLN
10.0000 meq | INTRAVENOUS | Status: DC
  Administered 2021-07-03 (×3): 10 meq via INTRAVENOUS
  Filled 2021-07-03 (×4): qty 100

## 2021-07-03 MED ORDER — POTASSIUM PHOSPHATES 15 MMOLE/5ML IV SOLN
10.0000 mmol | Freq: Once | INTRAVENOUS | Status: DC
  Filled 2021-07-03: qty 3.33

## 2021-07-03 MED ORDER — K PHOS MONO-SOD PHOS DI & MONO 155-852-130 MG PO TABS
500.0000 mg | ORAL_TABLET | ORAL | Status: DC
  Filled 2021-07-03 (×2): qty 2

## 2021-07-03 MED ORDER — LORAZEPAM 2 MG/ML IJ SOLN
1.0000 mg | Freq: Four times a day (QID) | INTRAMUSCULAR | Status: DC | PRN
  Administered 2021-07-03: 1 mg via INTRAVENOUS
  Filled 2021-07-03: qty 1

## 2021-07-03 MED ORDER — MORPHINE SULFATE (PF) 2 MG/ML IV SOLN
1.0000 mg | INTRAVENOUS | Status: DC | PRN
  Administered 2021-07-03 – 2021-07-05 (×3): 1 mg via INTRAVENOUS
  Filled 2021-07-03 (×4): qty 1

## 2021-07-03 NOTE — Progress Notes (Signed)
?Progress Note ? ? ?Patient: Robert Frye [id number] DOB: 1931-06-27 DOA: 07/01/2021     2 ?DOS: the patient was seen and examined on 07/03/2021 ?  ?Brief hospital course: ?Robert Frye is a 88 y.o. male with medical history significant for A-fib on Eliquis s/p cardioversion 03/13/2020 and 06/11/2020, HFpEF (EF 50 to 55% 05/2020), chronically elevated troponin with intermediate risk Lexiscan 03/2020, CKD 3B, dementia, hypothyroidism, HTN, chronic dyspnea with history of pleural effusion s/p left thoracentesis (06/19/2021, 1 L removed), presented to the ED today from [facility] via EMS with altered mental status and severe dehydration.  Of note, patient was discharged to SNF on 06/22/2021 after admission for an acute ischemic stroke.   ? ?Admitted with acute renal failure, hypernatremia, severe dehydration, and acute metabolic encephalopathy. ? ?Assessment and Plan: ?* Comfort measures only status ?Family decided this AM to transition to comfort care.  Orders placed. ?--Notify provider if any signs of distress, discomfort or pain ?--Palliative to contact residential hospice in AM tomorrow, if stable, anticipate d/c to hospice ? ?Chronic anticoagulation ?Was on Eliquis for A-fib ? ?Dementia without behavioral disturbance ([place]) ?Mental status on admission much worse than his baseline per family, due to dehydration and renal failure with electrolyte abnormalities. ?-- Delirium precautions ? ?Severe dehydration ?Due to recent stroke and dysphagia, patient has had minimal p.o. intake since discharge 3/31 to SNF.  Presents with acute renal failure and profound hypernatremia. ?-- Rehydrating, but fluids stopped today with transition to comfort care ? ? ?Acute renal failure (ARF) ([facility]) ?Due to severe dehydration, creatinine 3.21 on admission, up from 1.58 at time of discharge on 3/31.  Renal function improving with hydration.  Now on comfort care. ?No further labs. ? ?(HFpEF) heart failure with preserved ejection  fraction ([place]) ?Presents severely dehydrated and hypovolemic. ?Last echo March 2022 with EF 50 to 55% with severe LVH and indeterminate diastolic parameters ? ?Hyperchloremia ?Chloride 119 on admission along with hypernatremia likely due to dehydration. ? ?Hypernatremia ?Sodium 160 on admission due to profound dehydration.  Patient has reportedly had minimal p.o. intake since discharge to SNF on 3/31.  Hydrated with D5-1/2NS and improving. ?-- Now comfort, fluids stopped ?--No further labs ? ?History of stroke ?Patient was just discharged on 06/12/2021 after admission for an acute ischemic stroke despite taking Eliquis for his A-fib.  Residual deficits include dysphagia and right-sided weakness. ? ?Family report dysphagia seems worse since the time of discharge.   ?-- Hold Eliquis and Lipitor  ? ?Recurrent pleural effusion on left ?Patient underwent left-sided thoracentesis on 06/19/2021 with 1 L of fluid removed.  Chest x-ray on admission shows no pleural effusions or other acute cardiopulmonary abnormalities. ?Respiratory status stable. ? ?Persistent atrial fibrillation ([place]) ?Heart rate controlled on admission. ?Patient unfortunately suffered her stroke recently despite Eliquis. ?-- Hold Eliquis and amiodarone ? ? ?Stage 3b chronic kidney disease ([place]) ?Creatinine on discharge 06/22/2021 was 1.58. ?Creatinine on admission is 3.21 with BUN 113. ?Renal function improved with hydration. ?--No further labs ? ?Essential hypertension ?Blood pressures stable on admission. ?Takes 20 mg Lasix daily PTA. ? ?Paroxysmal atrial fibrillation ([place]) ?Heart rate controlled on admission. ?Patient unfortunately suffered her stroke recently despite Eliquis. ?-- Hold Eliquis and amiodarone pending swallow evaluation and improved mentation ?-- Monitor on telemetry ?-- Monitor replace electrolytes ? ?Hypothyroidism ?Levothyroxine d/c'd per comfort care ? ?Hyperglycemia ?Due to dextrose in fluids being given for AKI and  hypernatremia. ?--Sliding scale novolog and CBG's ordered ? ?Goals of care, counseling/discussion ?Recent stroke  in elderly gentleman with dementia at baseline, now with dysphagia.  ?Admitted with severe dehydration and AMS. ?--Palliative consulted ? ?4/10: Palliative and myself discussed patient's prognosis with family extensively.  Allowing time to see if any improvement in AMS once electrolytes and renal function better. ? ?4/11: AM transitioned to comfort care ? ?Protein-calorie malnutrition, severe ?Due to to chronic illness (dementia, stroke, CKD) as evidenced by severe fat depletion, severe muscle depletion. ?--Dietitian following, appreciate recs ?--Magic cup TID, Ensure drinks, MVI ? ?Acute metabolic encephalopathy ?Presented with altered mental status superimposed on baseline dementia.  Secondary to severe dehydration, renal failure and electrolyte derangements. ?-- Delirium precautions ? ?AKI (acute kidney injury) ([place]) ?Superimposed on CKD stage IIIb. ?See acute renal failure ? ? ? ? ?  ? ?Subjective: Pt seen this AM, family were out of the room.  Palliative had just notified me of their decision to transition to comfort care.  Pt woke up for me briefly, denied being in pain.   ? ?Physical Exam: ?Vitals:  ? 07/02/21 2018 07/03/21 0349 07/03/21 0459 07/03/21 0846  ?BP:  (!) 106/57  102/76  ?Pulse: 63 (!) 51  (!) 53  ?Resp:  18  18  ?Temp: 97.6 ?F (36.4 ?C) 97.7 ?F (36.5 ?C)  97.6 ?F (36.4 ?C)  ?TempSrc:      ?SpO2:  100%  100%  ?Weight:   58.2 kg   ?Height:      ? ?General exam: no acute distress, frail, appears comfortable ?HEENT: eyes closed but he opened them a few times briefly, moist mucus membranes, hard of hearing  ?Respiratory system: normal respiratory effort. ?Cardiovascular system: RRR, no pedal edema.   ?Gastrointestinal system: soft, NT ?Central nervous system: exam limited due to AMS ?Extremities: no edema, normal tone ?Skin: dry, intact, normal temperature ?Psychiatry: exam limited due to  AMS ? ? ?Data Reviewed: ? ?Notable labs:  Na 152, K 2.8, Cl 121, glucose 334, BUN 68, Cr 2.10, Ca 8.3, Phos 2.1, Mg 2.6, GFR 30 ? ?Family Communication: wife and son in hallway after my encounter with patient.  Confirmed comfort care decision and hope he can go to residential hospice. ? ?Disposition: ?Status is: Inpatient ?Remains inpatient appropriate because: on comfort care, anticipate d/c to residential hospice pending bed and pt stable to transport. ? ? Planned Discharge Destination:  Residential hospice ? ? ? ?Time spent: 35 minutes ? ?Author: ?Ezekiel Slocumb, DO ?07/03/2021 4:35 PM ? ?For on call review [email or web address].  ?

## 2021-07-03 NOTE — Assessment & Plan Note (Signed)
Family decided this AM to transition to comfort care.  Orders placed. ?--Notify provider if any signs of distress, discomfort or pain ?--Palliative to contact residential hospice in AM tomorrow, if stable, anticipate d/c to hospice ?

## 2021-07-03 NOTE — Plan of Care (Signed)
?  Problem: Education: ?Goal: Ability to demonstrate management of disease process will improve ?Outcome: Not Progressing ?  ?

## 2021-07-03 NOTE — Progress Notes (Signed)
Patient ID: [name], male   DOB: 1932/01/18, 88 y.o.   MRN: [id number] ? ? ? ?Progress Note from the Palliative Medicine Team at [facility] ? ? ?Patient Name: Robert Frye        ?Date: 07/03/2021 ?DOB: January 16, 1932  Age: 88 y.o. MRN#: [id number] ?Attending Physician: Ezekiel Slocumb, DO ?Primary Care Physician: Baxter Hire, MD ?Admit Date: 07/01/2021 ? ? ?Medical records reviewed  ? ?88 y.o. male   admitted on 07/01/2021 with past  ?medical history significant for A-fib on Eliquis s/p cardioversion 03/13/2020 and 06/11/2020, HFpEF (EF 50 to 55% 05/2020), chronically elevated troponin, CKD, dementia, hypothyroidism, HTN, chronic dyspnea with history of pleural effusion s/p left thoracentesis (06/19/2021, 1 L removed), presented to the ED today from [facility] via EMS with altered mental status and severe dehydration.   ?  ?Of note, patient was discharged to SNF on 06/22/2021 after admission for an acute ischemic stroke.   Wife and son report that patient has not been eating or drinking at rehab, has become progressively more weak and less responsive.   ?   ?Family report continued physical and functional decline over the past several years.  Patient does not have medical decision-making capacity at this time.  Family face treatment option decisions, advanced directive decisions and anticipatory care needs. ? ? ?This NP visited patient at the bedside as a follow up to  yesterday's [place].   ?Patient remains lethargic, unable to follow commands, tolerating only sips of fluid and currently complaining of pain in his left arm. ? ?I met again today with patient's wife, 2 sons and their pastor/Kelly.   Wife present at documents stating patient's desire for natural death.  Hard copy placed in chart. ? ?Continued conversation regarding current medical situation, and the natural trajectory when the body fails to thrive secondary to his multiple comorbidities and terminal diagnosis of dementia. ? ?Education  offered on the difference between aggressive medical intervention path and a palliative comfort path for this patient, at this time in this situation. ? ?Patient's wife is able to make the hard decision to shift to a intensive comfort path. ? ?Plan of Care: ?- DNR/DNI ?-no artificial feeding or hydration now or in the future/DC IV fluids/comfort feeds as tolerated ?-No further diagnostics; labs, x-rays, scans, finger sticks ?-Symptom management ?     -Morphine 1 mg IV every 2 hours as needed for pain or shortness of breath ?     -Ativan 1 mg IV every 6 hours as needed for agitation ?-Reevaluate in 24 hours for transition of care ? ?Education offered on hospice benefit; philosophy and eligibility. ? ? ? Discussed with Dr Arbutus Ped ? ? ?Wadie Lessen NP  ?Palliative Medicine Team Team Phone # [phone] ?Pager [phone] ?  ?

## 2021-07-03 NOTE — Progress Notes (Addendum)
PHARMACY CONSULT NOTE - FOLLOW UP ? ?Pharmacy Consult for Electrolyte Monitoring and Replacement  ? ?Recent Labs: ?Potassium (mmol/L)  ?Date Value  ?07/03/2021 2.8 (L)  ?03/19/2013 3.2 (L)  ? ?Magnesium (mg/dL)  ?Date Value  ?07/03/2021 2.6 (H)  ? ?Calcium (mg/dL)  ?Date Value  ?07/03/2021 8.3 (L)  ? ?Calcium, Total (mg/dL)  ?Date Value  ?03/19/2013 8.8  ? ?Albumin (g/dL)  ?Date Value  ?07/02/2021 2.9 (L)  ?06/13/2020 4.4  ?03/19/2013 3.9  ? ?Phosphorus (mg/dL)  ?Date Value  ?07/03/2021 2.1 (L)  ? ?Sodium (mmol/L)  ?Date Value  ?07/03/2021 152 (H)  ?06/18/2021 145 (H)  ?03/19/2013 123 (L)  ? ? ? ?Assessment: ?88 y.o. male with medical history significant for A-fib on Eliquis s/p cardioversion, HFpEF, CKD, dementia, hypothyroidism, HTN, chronic dyspnea with history of pleural effusion s/p left thoracentesis, presented to the ED with altered mental status and severe dehydration. Pharmacy consulted for electrolyte replacement.  ? ?MIVF: D5 1/2 NS @125  ml/hr ? ?Goal of Therapy: ?K >4, Mg >2 ?All other electrolytes WNL ? ?Plan:  ?K 2.8 - MD ordered 10 mEq IV Kcl x 6 runs. Will recheck K level one hour after last dose ?Phos 2.1 - given pt renal function will dose conservatively with 10 mmol IV Kphos (pt unable to take PO) ?Na 152, currently on D5 1/2 NS @ 125 ml/hr ? ?Sherilyn Banker ,PharmD ?Clinical Pharmacist ?07/03/2021 8:47 AM ? ?

## 2021-07-04 DIAGNOSIS — R52 Pain, unspecified: Secondary | ICD-10-CM | POA: Diagnosis not present

## 2021-07-04 DIAGNOSIS — R0609 Other forms of dyspnea: Secondary | ICD-10-CM | POA: Diagnosis not present

## 2021-07-04 DIAGNOSIS — Z515 Encounter for palliative care: Secondary | ICD-10-CM | POA: Diagnosis not present

## 2021-07-04 NOTE — Progress Notes (Signed)
SLP Cancellation Note ? ?Patient Details ?Name: Robert Frye ?MRN: [id number] ?DOB: 12-28-1931 ? ? ?Cancelled treatment:       Reason Eval/Treat Not Completed:  (chart reviewed; pt has moved to comfort care status per NSG, chart notes. ST services available if any needs.) ST services will sign off at this time.  ? ? ? ? ?Jerilynn Som, MS, CCC-SLP ?Speech Language Pathologist ?Rehab Services; [facility] [place] ?[phone] (ascom) ?Riad Wagley ?07/04/2021, 8:28 AM ?

## 2021-07-04 NOTE — Progress Notes (Signed)
?PROGRESS NOTE ? ? ? ?Robert Frye  [id number] DOB: 1931-09-04 DOA: 07/01/2021 ?PCP: Baxter Hire, MD  ? ? ?Brief Narrative:  ?Robert Frye is a 88 y.o. male with medical history significant for A-fib on Eliquis s/p cardioversion 03/13/2020 and 06/11/2020, HFpEF (EF 50 to 55% 05/2020), chronically elevated troponin with intermediate risk Lexiscan 03/2020, CKD 3B, dementia, hypothyroidism, HTN, chronic dyspnea with history of pleural effusion s/p left thoracentesis (06/19/2021, 1 L removed), presented to the ED today from [facility] via EMS with altered mental status and severe dehydration.  Of note, patient was discharged to SNF on 06/22/2021 after admission for an acute ischemic stroke.   ?  ?Admitted with acute renal failure, hypernatremia, severe dehydration, and acute metabolic encephalopathy. ? ? ? ?Consultants:  ?palliative ? ?Procedures:  ? ?Antimicrobials:  ? ? ?Subjective: ?Robert Frye at bedside. Pt appears comfortable. Denies any pain ? ?Objective: ?Vitals:  ? 07/03/21 0846 07/03/21 1926 07/03/21 1928 07/04/21 0754  ?BP: 102/76 (!) 96/53  (!) 104/58  ?Pulse: (!) 53 (!) 102 (!) 49 (!) 56  ?Resp: 18 18  20   ?Temp: 97.6 ?F (36.4 ?C) 97.7 ?F (36.5 ?C)  97.6 ?F (36.4 ?C)  ?TempSrc:  Oral    ?SpO2: 100% 91% 100% 98%  ?Weight:      ?Height:      ? ? ?Intake/Output Summary (Last 24 hours) at 07/04/2021 1622 ?Last data filed at 07/03/2021 1824 ?Gross per 24 hour  ?Intake 79.83 ml  ?Output --  ?Net 79.83 ml  ? ?Filed Weights  ? 07/02/21 0447 07/02/21 1538 07/03/21 0459  ?Weight: 57.9 kg 60 kg 58.2 kg  ? ? ?Examination: ?Calm, NAD ?Anteriorly with poor respiratory effort cta ?Reg s1/s2 no gallop ?Soft benign +bs ?No edema ?Eyes closed, comfortable. ?Mood and affect appropriate in current setting  ? ? ? ?Data Reviewed: I have personally reviewed following labs and imaging studies ? ?CBC: ?Recent Labs  ?Lab 07/01/21 ?1054 07/02/21 ?[id number]  ?WBC 12.6* 8.2  ?HGB 18.2* 14.0  ?HCT 57.7* 45.2  ?MCV 104.9* 108.9*  ?PLT 325  240  ? ?Basic Metabolic Panel: ?Recent Labs  ?Lab 07/01/21 ?1054 07/02/21 ?[id number] 07/02/21 ?2218 07/03/21 ?0433  ?NA 160* 154*  --  152*  ?K 4.1 3.5  --  2.8*  ?CL 119* 119*  --  121*  ?CO2 25 25  --  25  ?GLUCOSE 106* 482* 493* 334*  ?BUN 113* 93*  --  68*  ?CREATININE 3.21* 2.54*  --  2.10*  ?CALCIUM 10.5* 8.6*  --  8.3*  ?MG  --   --   --  2.6*  ?PHOS  --   --   --  2.1*  ? ?GFR: ?Estimated Creatinine Clearance: 20 mL/min (A) (by C-G formula based on SCr of 2.1 mg/dL (H)). ?Liver Function Tests: ?Recent Labs  ?Lab 07/01/21 ?1054 07/02/21 ?[id number]  ?AST 54* 48*  ?ALT 32 26  ?ALKPHOS 137* 100  ?BILITOT 1.6* 1.3*  ?PROT 8.4* 6.5  ?ALBUMIN 3.8 2.9*  ? ?No results for input(s): LIPASE, AMYLASE in the last 168 hours. ?No results for input(s): AMMONIA in the last 168 hours. ?Coagulation Profile: ?Recent Labs  ?Lab 07/01/21 ?1100  ?INR 1.3*  ? ?Cardiac Enzymes: ?No results for input(s): CKTOTAL, CKMB, CKMBINDEX, TROPONINI in the last 168 hours. ?BNP (last 3 results) ?No results for input(s): PROBNP in the last 8760 hours. ?HbA1C: ?No results for input(s): HGBA1C in the last 72 hours. ?CBG: ?Recent Labs  ?Lab 07/02/21 ?2133 07/02/21 ?2203  07/03/21 ?0014 07/03/21 ?[id number] 07/03/21 ?0847  ?GLUCAP 39* 136* 134* 95 111*  ? ?Lipid Profile: ?No results for input(s): CHOL, HDL, LDLCALC, TRIG, CHOLHDL, LDLDIRECT in the last 72 hours. ?Thyroid Function Tests: ?No results for input(s): TSH, T4TOTAL, FREET4, T3FREE, THYROIDAB in the last 72 hours. ?Anemia Panel: ?No results for input(s): VITAMINB12, FOLATE, FERRITIN, TIBC, IRON, RETICCTPCT in the last 72 hours. ?Sepsis Labs: ?No results for input(s): PROCALCITON, LATICACIDVEN in the last 168 hours. ? ?No results found for this or any previous visit (from the past 240 hour(s)).  ? ? ? ? ? ?Radiology Studies: ?No results found. ? ? ? ? ? ?Scheduled Meds: ? feeding supplement  237 mL Oral TID BM  ? ?Continuous Infusions: ? dextrose 5 % and 0.45% NaCl 10 mL/hr at 07/03/21 1824  ? ? ?Assessment  & Plan: ?  ?Principal Problem: ?  Comfort measures only status ?Active Problems: ?  Dementia without behavioral disturbance ([place]) ?  Chronic anticoagulation ?  (HFpEF) heart failure with preserved ejection fraction ([place]) ?  Acute renal failure (ARF) (HCC) ?  Severe dehydration ?  Hypernatremia ?  Hyperchloremia ?  History of stroke ?  Persistent atrial fibrillation ([place]) ?  Recurrent pleural effusion on left ?  Essential hypertension ?  Stage 3b chronic kidney disease ([place]) ?  Hypothyroidism ?  AKI (acute kidney injury) ([place]) ?  Acute metabolic encephalopathy ?  Protein-calorie malnutrition, severe ?  Goals of care, counseling/discussion ?  Hyperglycemia ? ? ?Comfort measures only status ?Family decided this AM to transition to comfort care.  Orders placed. ?--Notify provider if any signs of distress, discomfort or pain ?--Palliative following ?4/12 awaiting for residential hospice evaluation.  Was told there is no bed today.   ? ?  ?Chronic anticoagulation ?Was on Eliquis for A-fib ?  ?Dementia without behavioral disturbance ([place]) ?Mental status on admission much worse than his baseline per family, due to dehydration and renal failure with electrolyte abnormalities. ?/12 delirium precaution ?  ?Severe dehydration ?Due to recent stroke and dysphagia, patient has had minimal p.o. intake since discharge 3/31 to SNF.  Presents with acute renal failure and profound hypernatremia. ?-- Rehydrating, but fluids stopped today with transition to comfort care ?  ?  ?Acute renal failure (ARF) (HCC) ?Due to severe dehydration, creatinine 3.21 on admission, up from 1.58 at time of discharge on 3/31.  Renal function improving with hydration.  Now on comfort care. ?No further labs. ?  ?(HFpEF) heart failure with preserved ejection fraction ([place]) ?Presents severely dehydrated and hypovolemic. ?Last echo March 2022 with EF 50 to 55% with severe LVH and indeterminate diastolic parameters ?  ?Hyperchloremia ?Chloride 119 on admission  along with hypernatremia likely due to dehydration. ?  ?Hypernatremia ?Sodium 160 on admission due to profound dehydration.  Patient has reportedly had minimal p.o. intake since discharge to SNF on 3/31.  Hydrated with D5-1/2NS and improving. ?-- Now comfort, fluids stopped ?--No further labs ?  ?History of stroke ?Patient was just discharged on 06/12/2021 after admission for an acute ischemic stroke despite taking Eliquis for his A-fib.  Residual deficits include dysphagia and right-sided weakness. ?  ?Family report dysphagia seems worse since the time of discharge.   ?-- Hold Eliquis and Lipitor  ?  ?Recurrent pleural effusion on left ?Patient underwent left-sided thoracentesis on 06/19/2021 with 1 L of fluid removed.  Chest x-ray on admission shows no pleural effusions or other acute cardiopulmonary abnormalities. ?Respiratory status stable. ?  ?Persistent atrial fibrillation ([place]) ?Heart  rate controlled on admission. ?Patient unfortunately suffered her stroke recently despite Eliquis. ?-- Hold Eliquis and amiodarone ?  ?  ?Stage 3b chronic kidney disease ([place]) ?Creatinine on discharge 06/22/2021 was 1.58. ?Creatinine on admission is 3.21 with BUN 113. ?Renal function improved with hydration. ?--No further labs ?  ?Essential hypertension ?Blood pressures stable on admission. ?Takes 20 mg Lasix daily PTA. ?  ?Paroxysmal atrial fibrillation ([employer]) ?Heart rate controlled on admission. ?Patient unfortunately suffered her stroke recently despite Eliquis. ?-- Hold Eliquis and amiodarone pending swallow evaluation and improved mentation ?-- Monitor on telemetry ?-- Monitor replace electrolytes ?  ?Hypothyroidism ?Levothyroxine d/c'd per comfort care ?  ?Hyperglycemia ?Due to dextrose in fluids being given for AKI and hypernatremia. ?--Sliding scale novolog and CBG's ordered ?  ?Goals of care, counseling/discussion ?Recent stroke in elderly gentleman with dementia at baseline, now with dysphagia.  ?Admitted with severe  dehydration and AMS. ?--Palliative consulted ?  ?4/10: Palliative and myself discussed patient's prognosis with family extensively.  Allowing time to see if any improvement in AMS once electrolytes and renal

## 2021-07-04 NOTE — TOC Progression Note (Signed)
Transition of Care (TOC) - Progression Note  ? ? ?Patient Details  ?Name: Robert Frye ?MRN: [id number] ?Date of Birth: 1931/03/28 ? ?Transition of Care (TOC) CM/SW Contact  ?Truddie Hidden, RN ?Phone Number: ?07/04/2021, 4:14 PM ? ?Clinical Narrative:    ?Attempt to contact patient's wife for choice of hospice facility. No answer. Will reattempt at later time.  ? ? ?  ?  ? ?Expected Discharge Plan and Services ?  ?  ?  ?  ?  ?                ?  ?  ?  ?  ?  ?  ?  ?  ?  ?  ? ? ?Social Determinants of Health (SDOH) Interventions ?  ? ?Readmission Risk Interventions ?   ? View : No data to display.  ?  ?  ?  ? ? ?

## 2021-07-04 NOTE — Progress Notes (Signed)
Patient ID: Robert Frye, male   DOB: 1931/11/28, 88 y.o.   MRN: [id number] ? ? ? ?Progress Note from the Palliative Medicine Team at [facility] ? ? ?Patient Name: Robert Frye        ?Date: 07/04/2021 ?DOB: 14-Dec-1931  Age: 88 y.o. MRN#: [id number] ?Attending Physician: Lynn Ito, MD ?Primary Care Physician: Gracelyn Nurse, MD ?Admit Date: 07/01/2021 ? ? ?Medical records reviewed  ? ?88 y.o. male   admitted on 07/01/2021 with past  ?medical history significant for A-fib on Eliquis s/p cardioversion 03/13/2020 and 06/11/2020, HFpEF (EF 50 to 55% 05/2020), chronically elevated troponin, CKD, dementia, hypothyroidism, HTN, chronic dyspnea with history of pleural effusion s/p left thoracentesis (06/19/2021, 1 L removed), presented to the ED today from [facility] via EMS with altered mental status and severe dehydration.   ?  ?Of note, patient was discharged to SNF on 06/22/2021 after admission for an acute ischemic stroke.   Wife and son report that patient has not been eating or drinking at rehab, has become progressively more weak and less responsive.   ?   ?Family report continued physical and functional decline over the past several years.  Patient does not have medical decision-making capacity at this time.   ? ?Yesterday family made decision to shift to an aggressive comfort path of care.   ? ?This NP visited patient at the bedside as a follow up for palliative medicine needs and emotional support.  ? ?Patient remains lethargic, unable to follow commands, tolerating only sips of fluid.  He appears comfortable.  ? ?Wife  and son at bedside.   ? ?Continued conversation regarding current medical situation, and the natural trajectory at EOL.   ?Questions and concerns addressed ? ?Plan of Care: ?- DNR/DNI ?-no artificial feeding or hydration now or in the future/DC IV fluids/comfort feeds as tolerated ?-No further diagnostics; labs, x-rays, scans, finger sticks ?-Symptom management ?     -Morphine 1 mg IV every 2  hours as needed for pain or shortness of breath ?     -Ativan 1 mg IV every 6 hours as needed for agitation ?- Hopeful for residential hospice, will place referral   ? ?Education offered on hospice benefit; philosophy and eligibility. ? ? ? Discussed with TOC and bedside RN ? ? ?Lorinda Creed NP  ?Palliative Medicine Team Team Phone # [phone] ?Pager [phone] ?  ?

## 2021-07-04 NOTE — Care Management Important Message (Signed)
Important Message ? ?Patient Details  ?Name: Robert Frye ?MRN: [id number] ?Date of Birth: 12-09-1931 ? ? ?Medicare Important Message Given:  Other (see comment) ? ?On comfort care measures.  Medicare IM withheld at this time out of respect for patient and family. ? ? ?[name] ?07/04/2021, 8:24 AM ?

## 2021-07-05 DIAGNOSIS — N179 Acute kidney failure, unspecified: Secondary | ICD-10-CM | POA: Diagnosis not present

## 2021-07-05 DIAGNOSIS — R627 Adult failure to thrive: Secondary | ICD-10-CM | POA: Diagnosis not present

## 2021-07-05 DIAGNOSIS — R52 Pain, unspecified: Secondary | ICD-10-CM | POA: Diagnosis not present

## 2021-07-05 DIAGNOSIS — Z515 Encounter for palliative care: Secondary | ICD-10-CM | POA: Diagnosis not present

## 2021-07-05 MED ORDER — OXYCODONE HCL ER 10 MG PO T12A: 10.0000 mg | EXTENDED_RELEASE_TABLET | Freq: Two times a day (BID) | ORAL | Status: DC

## 2021-07-05 MED ORDER — MORPHINE SULFATE (PF) 2 MG/ML IV SOLN
1.0000 mg | INTRAVENOUS | Status: DC
Start: 2021-07-05 — End: 2021-07-07
  Administered 2021-07-05 – 2021-07-07 (×11): 1 mg via INTRAVENOUS
  Filled 2021-07-05 (×11): qty 1

## 2021-07-05 NOTE — Progress Notes (Signed)
Civil engineer, contracting [facility]) Hospital Liaison Note ?  ?Received request from TOC/[place] for family interest in Hospice Home. Family contacted (spouse/Betsy & son/Rick) to confirm interest in services, family confirmed. Family would like to meet with [facility] liaison tomorrow at bedside. [facility] to follow up tomorrow AM. ?  ?Please do not hesitate to call with any hospice related questions.  ?  ?Thank you for the opportunity to participate in this patient's care. ?  ?Odette Fraction, MSW ?[facility] Hospital Liaison  ?[phone] ?

## 2021-07-05 NOTE — Progress Notes (Signed)
?PROGRESS NOTE ? ? ? ?Robert Frye  [id number] DOB: 06-05-1931 DOA: 07/01/2021 ?PCP: Baxter Hire, MD  ? ? ?Brief Narrative:  ?Robert Frye is a 88 y.o. male with medical history significant for A-fib on Eliquis s/p cardioversion 03/13/2020 and 06/11/2020, HFpEF (EF 50 to 55% 05/2020), chronically elevated troponin with intermediate risk Lexiscan 03/2020, CKD 3B, dementia, hypothyroidism, HTN, chronic dyspnea with history of pleural effusion s/p left thoracentesis (06/19/2021, 1 L removed), presented to the ED today from [facility] via EMS with altered mental status and severe dehydration.  Of note, patient was discharged to SNF on 06/22/2021 after admission for an acute ischemic stroke.   ?  ?Admitted with acute renal failure, hypernatremia, severe dehydration, and acute metabolic encephalopathy. ? ?4/13 son asking to have pain meds placed as scheduled instead of as needed as patient was complaining of pain earlier ? ?Consultants:  ?palliative ? ?Procedures:  ? ?Antimicrobials:  ? ? ?Subjective: ?Patient currently resting comfortably no complaints ? ?Objective: ?Vitals:  ? 07/03/21 1928 07/04/21 0754 07/04/21 1958 07/05/21 0725  ?BP:  (!) 104/58  109/60  ?Pulse: (!) 49 (!) 56  65  ?Resp:  20 18 17   ?Temp:  97.6 ?F (36.4 ?C)    ?TempSrc:      ?SpO2: 100% 98%  100%  ?Weight:      ?Height:      ? ? ?Intake/Output Summary (Last 24 hours) at 07/05/2021 1548 ?Last data filed at 07/05/2021 0246 ?Gross per 24 hour  ?Intake 305 ml  ?Output --  ?Net 305 ml  ? ?Filed Weights  ? 07/02/21 0447 07/02/21 1538 07/03/21 0459  ?Weight: 57.9 kg 60 kg 58.2 kg  ? ? ?Examination: ?Calm, NAD, frail ?Cta no w/r ?Reg s1/s2 no gallop ?Soft benign +bs ?No edema ?Mood and affect appropriate in current setting  ? ? ? ?Data Reviewed: I have personally reviewed following labs and imaging studies ? ?CBC: ?Recent Labs  ?Lab 07/01/21 ?1054 07/02/21 ?[id number]  ?WBC 12.6* 8.2  ?HGB 18.2* 14.0  ?HCT 57.7* 45.2  ?MCV 104.9* 108.9*  ?PLT 325 240   ? ?Basic Metabolic Panel: ?Recent Labs  ?Lab 07/01/21 ?1054 07/02/21 ?[id number] 07/02/21 ?2218 07/03/21 ?0433  ?NA 160* 154*  --  152*  ?K 4.1 3.5  --  2.8*  ?CL 119* 119*  --  121*  ?CO2 25 25  --  25  ?GLUCOSE 106* 482* 493* 334*  ?BUN 113* 93*  --  68*  ?CREATININE 3.21* 2.54*  --  2.10*  ?CALCIUM 10.5* 8.6*  --  8.3*  ?MG  --   --   --  2.6*  ?PHOS  --   --   --  2.1*  ? ?GFR: ?Estimated Creatinine Clearance: 20 mL/min (A) (by C-G formula based on SCr of 2.1 mg/dL (H)). ?Liver Function Tests: ?Recent Labs  ?Lab 07/01/21 ?1054 07/02/21 ?[id number]  ?AST 54* 48*  ?ALT 32 26  ?ALKPHOS 137* 100  ?BILITOT 1.6* 1.3*  ?PROT 8.4* 6.5  ?ALBUMIN 3.8 2.9*  ? ?No results for input(s): LIPASE, AMYLASE in the last 168 hours. ?No results for input(s): AMMONIA in the last 168 hours. ?Coagulation Profile: ?Recent Labs  ?Lab 07/01/21 ?1100  ?INR 1.3*  ? ?Cardiac Enzymes: ?No results for input(s): CKTOTAL, CKMB, CKMBINDEX, TROPONINI in the last 168 hours. ?BNP (last 3 results) ?No results for input(s): PROBNP in the last 8760 hours. ?HbA1C: ?No results for input(s): HGBA1C in the last 72 hours. ?CBG: ?Recent Labs  ?Lab 07/02/21 ?  2133 07/02/21 ?2203 07/03/21 ?0014 07/03/21 ?4098 07/03/21 ?1191  ?GLUCAP 39* 136* 134* 95 111*  ? ?Lipid Profile: ?No results for input(s): CHOL, HDL, LDLCALC, TRIG, CHOLHDL, LDLDIRECT in the last 72 hours. ?Thyroid Function Tests: ?No results for input(s): TSH, T4TOTAL, FREET4, T3FREE, THYROIDAB in the last 72 hours. ?Anemia Panel: ?No results for input(s): VITAMINB12, FOLATE, FERRITIN, TIBC, IRON, RETICCTPCT in the last 72 hours. ?Sepsis Labs: ?No results for input(s): PROCALCITON, LATICACIDVEN in the last 168 hours. ? ?No results found for this or any previous visit (from the past 240 hour(s)).  ? ? ? ? ? ?Radiology Studies: ?No results found. ? ? ? ? ? ?Scheduled Meds: ? feeding supplement  237 mL Oral TID BM  ?  morphine injection  1 mg Intravenous Q4H  ? ?Continuous Infusions: ? dextrose 5 % and 0.45% NaCl 10  mL/hr at 07/05/21 0246  ? ? ?Assessment & Plan: ?  ?Principal Problem: ?  Comfort measures only status ?Active Problems: ?  Dementia without behavioral disturbance (HCC) ?  Chronic anticoagulation ?  (HFpEF) heart failure with preserved ejection fraction (HCC) ?  Acute renal failure (ARF) (HCC) ?  Severe dehydration ?  Hypernatremia ?  Hyperchloremia ?  History of stroke ?  Persistent atrial fibrillation (HCC) ?  Recurrent pleural effusion on left ?  Essential hypertension ?  Stage 3b chronic kidney disease (HCC) ?  Hypothyroidism ?  AKI (acute kidney injury) (HCC) ?  Acute metabolic encephalopathy ?  Protein-calorie malnutrition, severe ?  Goals of care, counseling/discussion ?  Hyperglycemia ? ? ?Comfort measures only status ?Family decided this AM to transition to comfort care.  Orders placed. ?--Notify provider if any signs of distress, discomfort or pain ?--Palliative following ?4/13 awaiting for residential hospice evaluation no beds currently  ? ? ? ?  ?Chronic anticoagulation ?On comfort care ?Was on anticoagulation due to A-fib ?  ?Dementia without behavioral disturbance (HCC) ?Mental status on admission much worse than his baseline per family, due to dehydration and renal failure with electrolyte abnormalities. ?4/13 delirium precautions ?  ?Severe dehydration ?Due to recent stroke and dysphagia, patient has had minimal p.o. intake since discharge 3/31 to SNF.  Presents with acute renal failure and profound hypernatremia. ?-- Rehydrating, but fluids stopped today with transition to comfort care ?  ?  ?Acute renal failure (ARF) (HCC) ?Due to severe dehydration, creatinine 3.21 on admission, up from 1.58 at time of discharge on 3/31.  Renal function improving with hydration.  Now on comfort care. ?No further labs. ?  ?(HFpEF) heart failure with preserved ejection fraction (HCC) ?Presents severely dehydrated and hypovolemic. ?Last echo March 2022 with EF 50 to 55% with severe LVH and indeterminate diastolic  parameters ?  ?Hyperchloremia ?Chloride 119 on admission along with hypernatremia likely due to dehydration. ?  ?Hypernatremia ?Sodium 160 on admission due to profound dehydration.  Patient has reportedly had minimal p.o. intake since discharge to SNF on 3/31.  Hydrated with D5-1/2NS and improving. ?-- Now comfort, fluids stopped ?--No further labs ?  ?History of stroke ?Patient was just discharged on 06/12/2021 after admission for an acute ischemic stroke despite taking Eliquis for his A-fib.  Residual deficits include dysphagia and right-sided weakness. ?  ?Family report dysphagia seems worse since the time of discharge.   ?-- Hold Eliquis and Lipitor  ?  ?Recurrent pleural effusion on left ?Patient underwent left-sided thoracentesis on 06/19/2021 with 1 L of fluid removed.  Chest x-ray on admission shows no pleural effusions or other  acute cardiopulmonary abnormalities. ?Respiratory status stable. ?  ?Persistent atrial fibrillation ([place]) ?Heart rate controlled on admission. ?Patient unfortunately suffered her stroke recently despite Eliquis. ?-- Hold Eliquis and amiodarone ?  ?  ?Stage 3b chronic kidney disease ([place]) ?Creatinine on discharge 06/22/2021 was 1.58. ?Creatinine on admission is 3.21 with BUN 113. ?Renal function improved with hydration. ?--No further labs ?  ?Essential hypertension ?Blood pressures stable on admission. ?Takes 20 mg Lasix daily PTA. ?  ?Paroxysmal atrial fibrillation ([place]) ?Heart rate controlled on admission. ?Patient unfortunately suffered her stroke recently despite Eliquis. ?-- Hold Eliquis and amiodarone pending swallow evaluation and improved mentation ?-- Monitor on telemetry ?-- Monitor replace electrolytes ?  ?Hypothyroidism ?Levothyroxine d/c'd per comfort care ?  ?Hyperglycemia ?Due to dextrose in fluids being given for AKI and hypernatremia. ?--Sliding scale novolog and CBG's ordered ?  ?Goals of care, counseling/discussion ?Recent stroke in elderly gentleman with dementia at  baseline, now with dysphagia.  ?Admitted with severe dehydration and AMS. ?--Palliative consulted ?  ?4/10: Palliative and myself discussed patient's prognosis with family extensively.  Allowing time to s

## 2021-07-05 NOTE — Progress Notes (Signed)
Patient ID: ADRIAL TY, male   DOB: 1932-01-19, 88 y.o.   MRN: [id number] ? ? ? ?Progress Note from the Palliative Medicine Team at [facility] ? ? ?Patient Name: Robert Frye        ?Date: 07/05/2021 ?DOB: 08/29/1931  Age: 88 y.o. MRN#: [id number] ?Attending Physician: Nolberto Hanlon, MD ?Primary Care Physician: Baxter Hire, MD ?Admit Date: 07/01/2021 ? ? ?Medical records reviewed  ? ?88 y.o. male   admitted on 07/01/2021 with past  ?medical history significant for A-fib on Eliquis s/p cardioversion 03/13/2020 and 06/11/2020, HFpEF (EF 50 to 55% 05/2020), chronically elevated troponin, CKD, dementia, hypothyroidism, HTN, chronic dyspnea with history of pleural effusion s/p left thoracentesis (06/19/2021, 1 L removed), presented to the ED today from [facility] via EMS with altered mental status and severe dehydration.   ?  ?Of note, patient was discharged to SNF on 06/22/2021 after admission for an acute ischemic stroke.   Wife and son report that patient has not been eating or drinking at rehab, has become progressively more weak and less responsive.   ?   ?Family report continued physical and functional decline over the past several years.  ? ? Patient does not have medical decision-making capacity at this time.   ? ?Family made decision to shift to an aggressive comfort path of care.   ? ?This NP visited patient at the bedside as a follow up for palliative medicine needs and emotional support.  ? ?Patient appears comfortable currently , but wife reports concern for underlying pain interrupting him thru the night.  ? ?Wife  and son at bedside.  They verbalize frustration with there not being bed available at the hospice home. Emotional support offered. ? ?Continued conversation regarding current medical situation, and the natural trajectory at EOL.   ? ?Questions and concerns addressed ? ?Plan of Care: ?- DNR/DNI ?-no artificial feeding or hydration now or in the future/DC IV fluids/comfort feeds as  tolerated ?-No further diagnostics; labs, x-rays, scans, finger sticks ?-Symptom management ?     -Morphine 1 mg IV every 2 hours as needed for pain or shortness of breath ?Added: Morphine 1 mg scheduled every 4 hrs for underlying pain, discussed with family and they are in agreement ?     -Ativan 1 mg IV every 6 hours as needed for agitation ?- Hopeful for residential hospice, await bed at this time. ? ?Education offered on hospice benefit; philosophy and eligibility. ? ? ? Discussed with TOC and bedside RN ? ? ?Wadie Lessen NP  ?Palliative Medicine Team Team Phone # [phone] ?Pager [phone] ?  ?

## 2021-07-05 NOTE — Progress Notes (Signed)
Nutrition Brief Note  Chart reviewed. Pt now transitioning to comfort care.  No further nutrition interventions planned at this time.  Please re-consult as needed.   Julieann Drummonds W, RD, LDN, CDCES Registered Dietitian II Certified Diabetes Care and Education Specialist Please refer to AMION for RD and/or RD on-call/weekend/after hours pager   

## 2021-07-05 NOTE — TOC Progression Note (Signed)
Transition of Care (TOC) - Progression Note  ? ? ?Patient Details  ?Name: Robert Frye ?MRN: [id number] ?Date of Birth: September 11, 1931 ? ?Transition of Care (TOC) CM/SW Contact  ?Truddie Hidden, RN ?Phone Number: ?07/05/2021, 4:22 PM ? ?Clinical Narrative:    ?Spoke with family at bedside. Family confirmed choice for [facility]. Odette Fraction contacted.  ? ? ?  ?  ? ?Expected Discharge Plan and Services ?  ?  ?  ?  ?  ?                ?  ?  ?  ?  ?  ?  ?  ?  ?  ?  ? ? ?Social Determinants of Health (SDOH) Interventions ?  ? ?Readmission Risk Interventions ?   ? View : No data to display.  ?  ?  ?  ? ? ?

## 2021-07-06 DIAGNOSIS — Z515 Encounter for palliative care: Secondary | ICD-10-CM | POA: Diagnosis not present

## 2021-07-06 NOTE — TOC Progression Note (Addendum)
Transition of Care (TOC) - Progression Note  ? ? ?Patient Details  ?Name: Robert Frye ?MRN: [id number] ?Date of Birth: 13-Dec-1931 ? ?Transition of Care (TOC) CM/SW Contact  ?Truddie Hidden, RN ?Phone Number: ?07/06/2021, 10:43 AM ? ?Clinical Narrative:    ? ?Spoke with Clydie Braun from [facility]. No beds available.  ? ?  ?  ? ?Expected Discharge Plan and Services ?  ?  ?  ?  ?  ?                ?  ?  ?  ?  ?  ?  ?  ?  ?  ?  ? ? ?Social Determinants of Health (SDOH) Interventions ?  ? ?Readmission Risk Interventions ?   ? View : No data to display.  ?  ?  ?  ? ? ?

## 2021-07-06 NOTE — Progress Notes (Signed)
?PROGRESS NOTE ? ? ? ?Robert Frye  [id number] DOB: [birth year] DOA: 07/01/2021 ?PCP: Baxter Hire, MD  ? ? ?Brief Narrative:  ?Robert Frye is a 88 y.o. male with medical history significant for A-fib on Eliquis s/p cardioversion 03/13/2020 and 06/11/2020, HFpEF (EF 50 to 55% 05/2020), chronically elevated troponin with intermediate risk Lexiscan 03/2020, CKD 3B, dementia, hypothyroidism, HTN, chronic dyspnea with history of pleural effusion s/p left thoracentesis (06/19/2021, 1 L removed), presented to the ED today from [facility] via EMS with altered mental status and severe dehydration.  Of note, patient was discharged to SNF on 06/22/2021 after admission for an acute ischemic stroke.   ?  ?Admitted with acute renal failure, hypernatremia, severe dehydration, and acute metabolic encephalopathy. ? ?4/13 son asking to have pain meds placed as scheduled instead of as needed as patient was complaining of pain earlier ?4/14no hospice bed available. Wife at bedside.  ? ? ? ?Consultants:  ?palliative ? ?Procedures:  ? ?Antimicrobials:  ? ? ?Subjective: ?Wife reports pt comfortable. No pain ? ?Objective: ?Vitals:  ? 07/04/21 1958 07/05/21 0725 07/05/21 2037 07/06/21 0737  ?BP:  109/60 110/62 (!) 97/56  ?Pulse:  65 66 64  ?Resp: 18 17 16 20   ?Temp:   98.1 ?F (36.7 ?C) 98 ?F (36.7 ?C)  ?TempSrc:   Oral Oral  ?SpO2:  100% 97% 95%  ?Weight:      ?Height:      ? ? ?Intake/Output Summary (Last 24 hours) at 07/06/2021 1432 ?Last data filed at 07/06/2021 1017 ?Gross per 24 hour  ?Intake 180 ml  ?Output 600 ml  ?Net -420 ml  ? ?Filed Weights  ? 07/02/21 0447 07/02/21 1538 07/03/21 0459  ?Weight: 57.9 kg 60 kg 58.2 kg  ? ? ?Examination: ?Calm, NAD, frail ?Cta no w/r anteriorly ?Reg s1/s2 no gallop ?Soft benign +bs ?No edema ? ? ? ?Data Reviewed: I have personally reviewed following labs and imaging studies ? ?CBC: ?Recent Labs  ?Lab 07/01/21 ?1054 07/02/21 ?[id number]  ?WBC 12.6* 8.2  ?HGB 18.2* 14.0  ?HCT 57.7* 45.2  ?MCV  104.9* 108.9*  ?PLT 325 240  ? ?Basic Metabolic Panel: ?Recent Labs  ?Lab 07/01/21 ?1054 07/02/21 ?[id number] 07/02/21 ?2218 07/03/21 ?0433  ?NA 160* 154*  --  152*  ?K 4.1 3.5  --  2.8*  ?CL 119* 119*  --  121*  ?CO2 25 25  --  25  ?GLUCOSE 106* 482* 493* 334*  ?BUN 113* 93*  --  68*  ?CREATININE 3.21* 2.54*  --  2.10*  ?CALCIUM 10.5* 8.6*  --  8.3*  ?MG  --   --   --  2.6*  ?PHOS  --   --   --  2.1*  ? ?GFR: ?Estimated Creatinine Clearance: 20 mL/min (A) (by C-G formula based on SCr of 2.1 mg/dL (H)). ?Liver Function Tests: ?Recent Labs  ?Lab 07/01/21 ?1054 07/02/21 ?[id number]  ?AST 54* 48*  ?ALT 32 26  ?ALKPHOS 137* 100  ?BILITOT 1.6* 1.3*  ?PROT 8.4* 6.5  ?ALBUMIN 3.8 2.9*  ? ?No results for input(s): LIPASE, AMYLASE in the last 168 hours. ?No results for input(s): AMMONIA in the last 168 hours. ?Coagulation Profile: ?Recent Labs  ?Lab 07/01/21 ?1100  ?INR 1.3*  ? ?Cardiac Enzymes: ?No results for input(s): CKTOTAL, CKMB, CKMBINDEX, TROPONINI in the last 168 hours. ?BNP (last 3 results) ?No results for input(s): PROBNP in the last 8760 hours. ?HbA1C: ?No results for input(s): HGBA1C in the last 72 hours. ?CBG: ?  Recent Labs  ?Lab 07/02/21 ?2133 07/02/21 ?2203 07/03/21 ?0014 07/03/21 ?[id number] 07/03/21 ?0847  ?GLUCAP 39* 136* 134* 95 111*  ? ?Lipid Profile: ?No results for input(s): CHOL, HDL, LDLCALC, TRIG, CHOLHDL, LDLDIRECT in the last 72 hours. ?Thyroid Function Tests: ?No results for input(s): TSH, T4TOTAL, FREET4, T3FREE, THYROIDAB in the last 72 hours. ?Anemia Panel: ?No results for input(s): VITAMINB12, FOLATE, FERRITIN, TIBC, IRON, RETICCTPCT in the last 72 hours. ?Sepsis Labs: ?No results for input(s): PROCALCITON, LATICACIDVEN in the last 168 hours. ? ?No results found for this or any previous visit (from the past 240 hour(s)).  ? ? ? ? ? ?Radiology Studies: ?No results found. ? ? ? ? ? ?Scheduled Meds: ? feeding supplement  237 mL Oral TID BM  ?  morphine injection  1 mg Intravenous Q4H  ? ?Continuous Infusions: ?  dextrose 5 % and 0.45% NaCl 10 mL/hr at 07/05/21 0246  ? ? ?Assessment & Plan: ?  ?Principal Problem: ?  Comfort measures only status ?Active Problems: ?  Dementia without behavioral disturbance ([place]) ?  Chronic anticoagulation ?  (HFpEF) heart failure with preserved ejection fraction ([place]) ?  Acute renal failure (ARF) (HCC) ?  Severe dehydration ?  Hypernatremia ?  Hyperchloremia ?  History of stroke ?  Persistent atrial fibrillation ([place]) ?  Recurrent pleural effusion on left ?  Essential hypertension ?  Stage 3b chronic kidney disease ([place]) ?  Hypothyroidism ?  AKI (acute kidney injury) ([place]) ?  Acute metabolic encephalopathy ?  Protein-calorie malnutrition, severe ?  Goals of care, counseling/discussion ?  Hyperglycemia ? ? ?Comfort measures only status ?Family decided this AM to transition to comfort care.  Orders placed. ?--Notify provider if any signs of distress, discomfort or pain ?--Palliative following ?4/14 awaiting for residential hospice bed. ? ? ? ? ?  ?Chronic anticoagulation ?Was on anticoagulation due to A-fib ?4/14 continue comfort measures ?  ?Dementia without behavioral disturbance ([place]) ?Mental status on admission much worse than his baseline per family, due to dehydration and renal failure with electrolyte abnormalities. ?4/13 delirium precautions ?  ?Severe dehydration ?Due to recent stroke and dysphagia, patient has had minimal p.o. intake since discharge 3/31 to SNF.  Presents with acute renal failure and profound hypernatremia. ?-- Rehydrating, but fluids stopped today with transition to comfort care ?  ?  ?Acute renal failure (ARF) (HCC) ?Due to severe dehydration, creatinine 3.21 on admission, up from 1.58 at time of discharge on 3/31.  Renal function improving with hydration.  Now on comfort care. ?No further labs. ?  ?(HFpEF) heart failure with preserved ejection fraction ([place]) ?Presents severely dehydrated and hypovolemic. ?Last echo March 2022 with EF 50 to 55% with severe LVH and  indeterminate diastolic parameters ?  ?Hyperchloremia ?Chloride 119 on admission along with hypernatremia likely due to dehydration. ?  ?Hypernatremia ?Sodium 160 on admission due to profound dehydration.  Patient has reportedly had minimal p.o. intake since discharge to SNF on 3/31.  Hydrated with D5-1/2NS and improving. ?-- Now comfort, fluids stopped ?--No further labs ?  ?History of stroke ?Patient was just discharged on 06/12/2021 after admission for an acute ischemic stroke despite taking Eliquis for his A-fib.  Residual deficits include dysphagia and right-sided weakness. ?  ?Family report dysphagia seems worse since the time of discharge.   ?-- Hold Eliquis and Lipitor  ?  ?Recurrent pleural effusion on left ?Patient underwent left-sided thoracentesis on 06/19/2021 with 1 L of fluid removed.  Chest x-ray on admission shows no pleural  effusions or other acute cardiopulmonary abnormalities. ?Respiratory status stable. ?  ?Persistent atrial fibrillation ([place]) ?Heart rate controlled on admission. ?Patient unfortunately suffered her stroke recently despite Eliquis. ?-- Hold Eliquis and amiodarone ?  ?  ?Stage 3b chronic kidney disease ([place]) ?Creatinine on discharge 06/22/2021 was 1.58. ?Creatinine on admission is 3.21 with BUN 113. ?Renal function improved with hydration. ?--No further labs ?  ?Essential hypertension ?Blood pressures stable on admission. ?Takes 20 mg Lasix daily PTA. ?  ?Paroxysmal atrial fibrillation ([place]) ?Heart rate controlled on admission. ?Patient unfortunately suffered her stroke recently despite Eliquis. ?-- Hold Eliquis and amiodarone pending swallow evaluation and improved mentation ?-- Monitor on telemetry ?-- Monitor replace electrolytes ?  ?Hypothyroidism ?Levothyroxine d/c'd per comfort care ?  ?Hyperglycemia ?Due to dextrose in fluids being given for AKI and hypernatremia. ?--Sliding scale novolog and CBG's ordered ?  ?Goals of care, counseling/discussion ?Recent stroke in elderly  gentleman with dementia at baseline, now with dysphagia.  ?Admitted with severe dehydration and AMS. ?--Palliative consulted ?  ?4/10: Palliative and myself discussed patient's prognosis with family extensively.

## 2021-07-07 NOTE — TOC Transition Note (Signed)
Transition of Care (TOC) - CM/SW Discharge Note ? ? ?Patient Details  ?Name: MERCY MALENA ?MRN: [id number] ?Date of Birth: 26-May-1931 ? ?Transition of Care (TOC) CM/SW Contact:  ?Joenathan Sakuma E Flavio Lindroth, LCSW ?Phone Number: ?07/07/2021, 10:44 AM ? ? ?Clinical Narrative:    ?Patient has a bed at [facility] in [place] today. ?Family has been updated by MD. ?EMS paperwork completed. DNR on chart. ?[facility] called for transport at 10:44 when notified by [facility] Rep Victorino Dike that they are ready for transport. Patient is next on the list for EMS pick up. Care team notified. ? ? ?Final next level of care: Hospice Medical Facility ?Barriers to Discharge: Barriers Resolved ? ? ?Patient Goals and CMS Choice ?  ?  ?  ? ?Discharge Placement ?  ?           ?  ?Patient to be transferred to facility by: [facility] ?  ?  ? ?Discharge Plan and Services ?  ?  ?           ?  ?  ?  ?  ?  ?  ?  ?  ?  ?  ? ?Social Determinants of Health (SDOH) Interventions ?  ? ? ?Readmission Risk Interventions ?   ? View : No data to display.  ?  ?  ?  ? ? ? ? ? ?

## 2021-07-07 NOTE — Discharge Summary (Signed)
[name] [id number] DOB: 24-Dec-1931 DOA: 07/01/2021 ? ?PCP: Baxter Hire, MD ? ?Admit date: 07/01/2021 ?Discharge date: 07/07/2021 ? ?Admitted From: SNF ?Disposition:  hospice facility ? ? ?Discharge Condition:Stable ?CODE STATUS:DNR ?Diet recommendation: regular ?Brief/Interim Summary: ?Per HPI:  Robert Frye is a 88 y.o. male with medical history significant for A-fib on Eliquis s/p cardioversion 03/13/2020 and 06/11/2020, HFpEF (EF 50 to 55% 05/2020), chronically elevated troponin with intermediate risk Lexiscan 03/2020, CKD 3B, dementia, hypothyroidism, HTN, chronic dyspnea with history of pleural effusion s/p left thoracentesis (06/19/2021, 1 L removed), presented to the ED today from [facility] via EMS with altered mental status and severe dehydration.  Of note, patient was discharged to SNF on 06/22/2021 after admission for an acute ischemic stroke.  Due to altered mental status, patient is not able to provide history.  Wife and son are at bedside and reports that patient has not been eating or drinking at rehab, has become progressively more weak and less responsive.  This morning, wife reports that she found patient's fingertips or appearing blue, she expressed concern to staff and EMS was called to bring patient to the hospital.  Family also report patient's residual neurologic deficits include issues with swallowing which has worsened since discharge, and right-sided arm and leg weakness. ?  ?ED course: Temp 97.4, pulse 78, respirations 23, BP 122/80, initial O2 sat 100% on room air.  Labs consistent with severe dehydration with sodium 160, chloride 119, BUN 113, creatinine 3.21 (up from 1.58 at discharge on 3/31), anion gap 16 but normal bicarb.  CBC consistent with hemoconcentration with hemoglobin of 18.2, mild leukocytosis WBC 12.6 and normal platelets.  INR 1.3.  Chest x-ray read as negative . ?Patient was placed on comfort measures with hospice facility by family. ? ?Comfort measures only  status ?Family decided this AM to transition to comfort care.  Orders placed. ?--Notify provider if any signs of distress, discomfort or pain ?--Palliative to contact residential hospice in AM tomorrow, if stable, anticipate d/c to hospice ?  ?Chronic anticoagulation ?Was on Eliquis for A-fib ?  ?Dementia without behavioral disturbance ([place]) ?Mental status on admission much worse than his baseline per family, due to dehydration and renal failure with electrolyte abnormalities. ?-- Delirium precautions ?  ?Severe dehydration ?Due to recent stroke and dysphagia, patient has had minimal p.o. intake since discharge 3/31 to SNF.  Presents with acute renal failure and profound hypernatremia. ?-- Rehydrating, but fluids stopped today with transition to comfort care ?  ?  ?Acute renal failure (ARF) (HCC) ?Due to severe dehydration, creatinine 3.21 on admission, up from 1.58 at time of discharge on 3/31.  Renal function improving with hydration.  Now on comfort care. ?No further labs. ?  ?(HFpEF) heart failure with preserved ejection fraction ([place]) ?Presents severely dehydrated and hypovolemic. ?Last echo March 2022 with EF 50 to 55% with severe LVH and indeterminate diastolic parameters ?  ?Hyperchloremia ?Chloride 119 on admission along with hypernatremia likely due to dehydration. ?  ?Hypernatremia ?Sodium 160 on admission due to profound dehydration.  Patient has reportedly had minimal p.o. intake since discharge to SNF on 3/31.  Hydrated with D5-1/2NS and improving. ?-- Now comfort, fluids stopped ?--No further labs ?  ?History of stroke ?Patient was just discharged on 06/12/2021 after admission for an acute ischemic stroke despite taking Eliquis for his A-fib.  Residual deficits include dysphagia and right-sided weakness. ?  ?Family report dysphagia seems worse since the time of discharge.   ?-- Hold  Eliquis and Lipitor  ?  ?Recurrent pleural effusion on left ?Patient underwent left-sided thoracentesis on 06/19/2021 with  1 L of fluid removed.  Chest x-ray on admission shows no pleural effusions or other acute cardiopulmonary abnormalities. ?Respiratory status stable. ?  ?Persistent atrial fibrillation ([place]) ?Heart rate controlled on admission. ?Patient unfortunately suffered her stroke recently despite Eliquis. ?-- Hold Eliquis and amiodarone ?  ?  ?Stage 3b chronic kidney disease ([place]) ?--No further labs ?  ?Essential hypertension ?Blood pressures stable on admission. ? ? ?  ?Hypothyroidism ?Levothyroxine d/c'd per comfort care ?  ?Hyperglycemia ?Due to dextrose in fluids being given for AKI and hypernatremia. ? ?Goals of care, counseling/discussion ?Recent stroke in elderly gentleman with dementia at baseline, now with dysphagia.  ?Admitted with severe dehydration and AMS. ?--Palliative consulted, now comfort  ? ? ?Protein-calorie malnutrition, severe ?Due to to chronic illness (dementia, stroke, CKD) as evidenced by severe fat depletion, severe muscle depletion. ? ?  ?Acute metabolic encephalopathy ?Presented with altered mental status superimposed on baseline dementia.  Secondary to severe dehydration, renal failure and electrolyte derangements. ?-- Delirium precautions ?  ?AKI (acute kidney injury) ([place]) ?Superimposed on CKD stage IIIb. ? ?  ? ? ? ? ?Discharge Diagnoses:  ?Principal Problem: ?  Comfort measures only status ?Active Problems: ?  Dementia without behavioral disturbance ([place]) ?  Chronic anticoagulation ?  (HFpEF) heart failure with preserved ejection fraction ([place]) ?  Acute renal failure (ARF) (HCC) ?  Severe dehydration ?  Hypernatremia ?  Hyperchloremia ?  History of stroke ?  Persistent atrial fibrillation ([place]) ?  Recurrent pleural effusion on left ?  Essential hypertension ?  Stage 3b chronic kidney disease ([place]) ?  Hypothyroidism ?  AKI (acute kidney injury) ([place]) ?  Acute metabolic encephalopathy ?  Protein-calorie malnutrition, severe ?  Goals of care, counseling/discussion ?  Hyperglycemia ? ? ? ?Discharge  Instructions ? ?Discharge Instructions   ? ? Diet - low sodium heart healthy   Complete by: As directed ?  ? Increase activity slowly   Complete by: As directed ?  ? No wound care   Complete by: As directed ?  ? ?  ? ?Allergies as of 07/07/2021   ? ?   Reactions  ? Jardiance [empagliflozin] Other (See Comments)  ? Dizziness/ weakness  ? ?  ? ?  ?Medication List  ?  ? ?STOP taking these medications   ? ?amiodarone 100 MG tablet ?Commonly known as: PACERONE ?  ?apixaban 2.5 MG Tabs tablet ?Commonly known as: ELIQUIS ?  ?atorvastatin 40 MG tablet ?Commonly known as: LIPITOR ?  ?CLEAR EYES OP ?  ?cyanocobalamin 1000 MCG tablet ?  ?Dialyvite Vitamin D 5000 125 MCG (5000 UT) capsule ?Generic drug: Cholecalciferol ?  ?feeding supplement Liqd ?  ?furosemide 20 MG tablet ?Commonly known as: LASIX ?  ?levothyroxine 75 MCG tablet ?Commonly known as: SYNTHROID ?  ?multivitamin with minerals Tabs tablet ?  ?potassium chloride SA 20 MEQ tablet ?Commonly known as: Klor-Con M20 ?  ? ?  ? ? ?Allergies  ?Allergen Reactions  ? Jardiance [Empagliflozin] Other (See Comments)  ?  Dizziness/ weakness  ? ? ?Consultations: ?palliative ? ? ?Procedures/Studies: ?DG Chest 2 View ? ?Result Date: 06/18/2021 ?CLINICAL DATA:  Weakness. EXAM: CHEST - 2 VIEW COMPARISON:  Chest radiograph dated 05/30/2020. FINDINGS: Moderate left pleural effusion and left lung base atelectasis or infiltrate. The right lung is clear. There is no pneumothorax. The cardiac silhouette is within normal limits. Atherosclerotic calcification of  the aorta. Degenerative changes of the spine. No acute osseous pathology. IMPRESSION: Moderate left pleural effusion and left lung base atelectasis or infiltrate. Electronically Signed   By: Anner Crete M.D.   On: 06/18/2021 19:49  ? ?CT HEAD WO CONTRAST (5MM) ? ?Result Date: 06/18/2021 ?CLINICAL DATA:  Right lower extremity weakness worsening for several weeks, history of dementia EXAM: CT HEAD WITHOUT CONTRAST TECHNIQUE:  Contiguous axial images were obtained from the base of the skull through the vertex without intravenous contrast. RADIATION DOSE REDUCTION: This exam was performed according to the departmental dose-optimiz

## 2021-07-07 NOTE — Progress Notes (Signed)
Patient received residential hospice bed and was transported by EMS at 1125.  EMS received patient demographics, facesheet AVS, along with signed DNR before transporting patient.  Family was made aware of transfer and present at the bedside upon EMS arrival.  PIV was left in place for use at facility receiving the patient.     ?

## 2021-07-07 NOTE — Progress Notes (Signed)
Manuela Schwartz NP notified 0345 dose of morphine not given due to pt sleeping quietly ?

## 2021-07-07 NOTE — Progress Notes (Signed)
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 ?Hospice [facility] has a bed to offer [name] today and the family would like to accept it. ? ?Once necessary consents are obtained, transport can be arranged by [facility] manager. ? ?RN staff, please call report to [phone]. ? ?Thank you, ?Robert Frye BSN, RN ?[facility] Liaison  ?

## 2021-07-11 ENCOUNTER — Other Ambulatory Visit: Payer: Self-pay

## 2021-07-11 MED ORDER — POTASSIUM CHLORIDE ER 20 MEQ PO TBCR: 1.0000 | EXTENDED_RELEASE_TABLET | Freq: Every day | ORAL | 3 refills | Status: AC

## 2021-07-23 DEATH — deceased

## 2021-07-24 ENCOUNTER — Ambulatory Visit: Payer: Medicare Other | Admitting: Cardiovascular Disease

## 2021-07-27 ENCOUNTER — Other Ambulatory Visit: Payer: Self-pay | Admitting: Cardiovascular Disease

## 2021-07-30 ENCOUNTER — Other Ambulatory Visit: Payer: Self-pay | Admitting: Cardiovascular Disease

## 2021-09-18 ENCOUNTER — Ambulatory Visit: Payer: Medicare Other | Admitting: Physician Assistant

## 2022-01-01 IMAGING — CR DG CHEST 2V
2 series · 2 of 2 positions shown · non-contrast
Comparison: None.

CLINICAL DATA: Shortness of breath since yesterday.

EXAM:
CHEST - 2 VIEW

[chest lat]
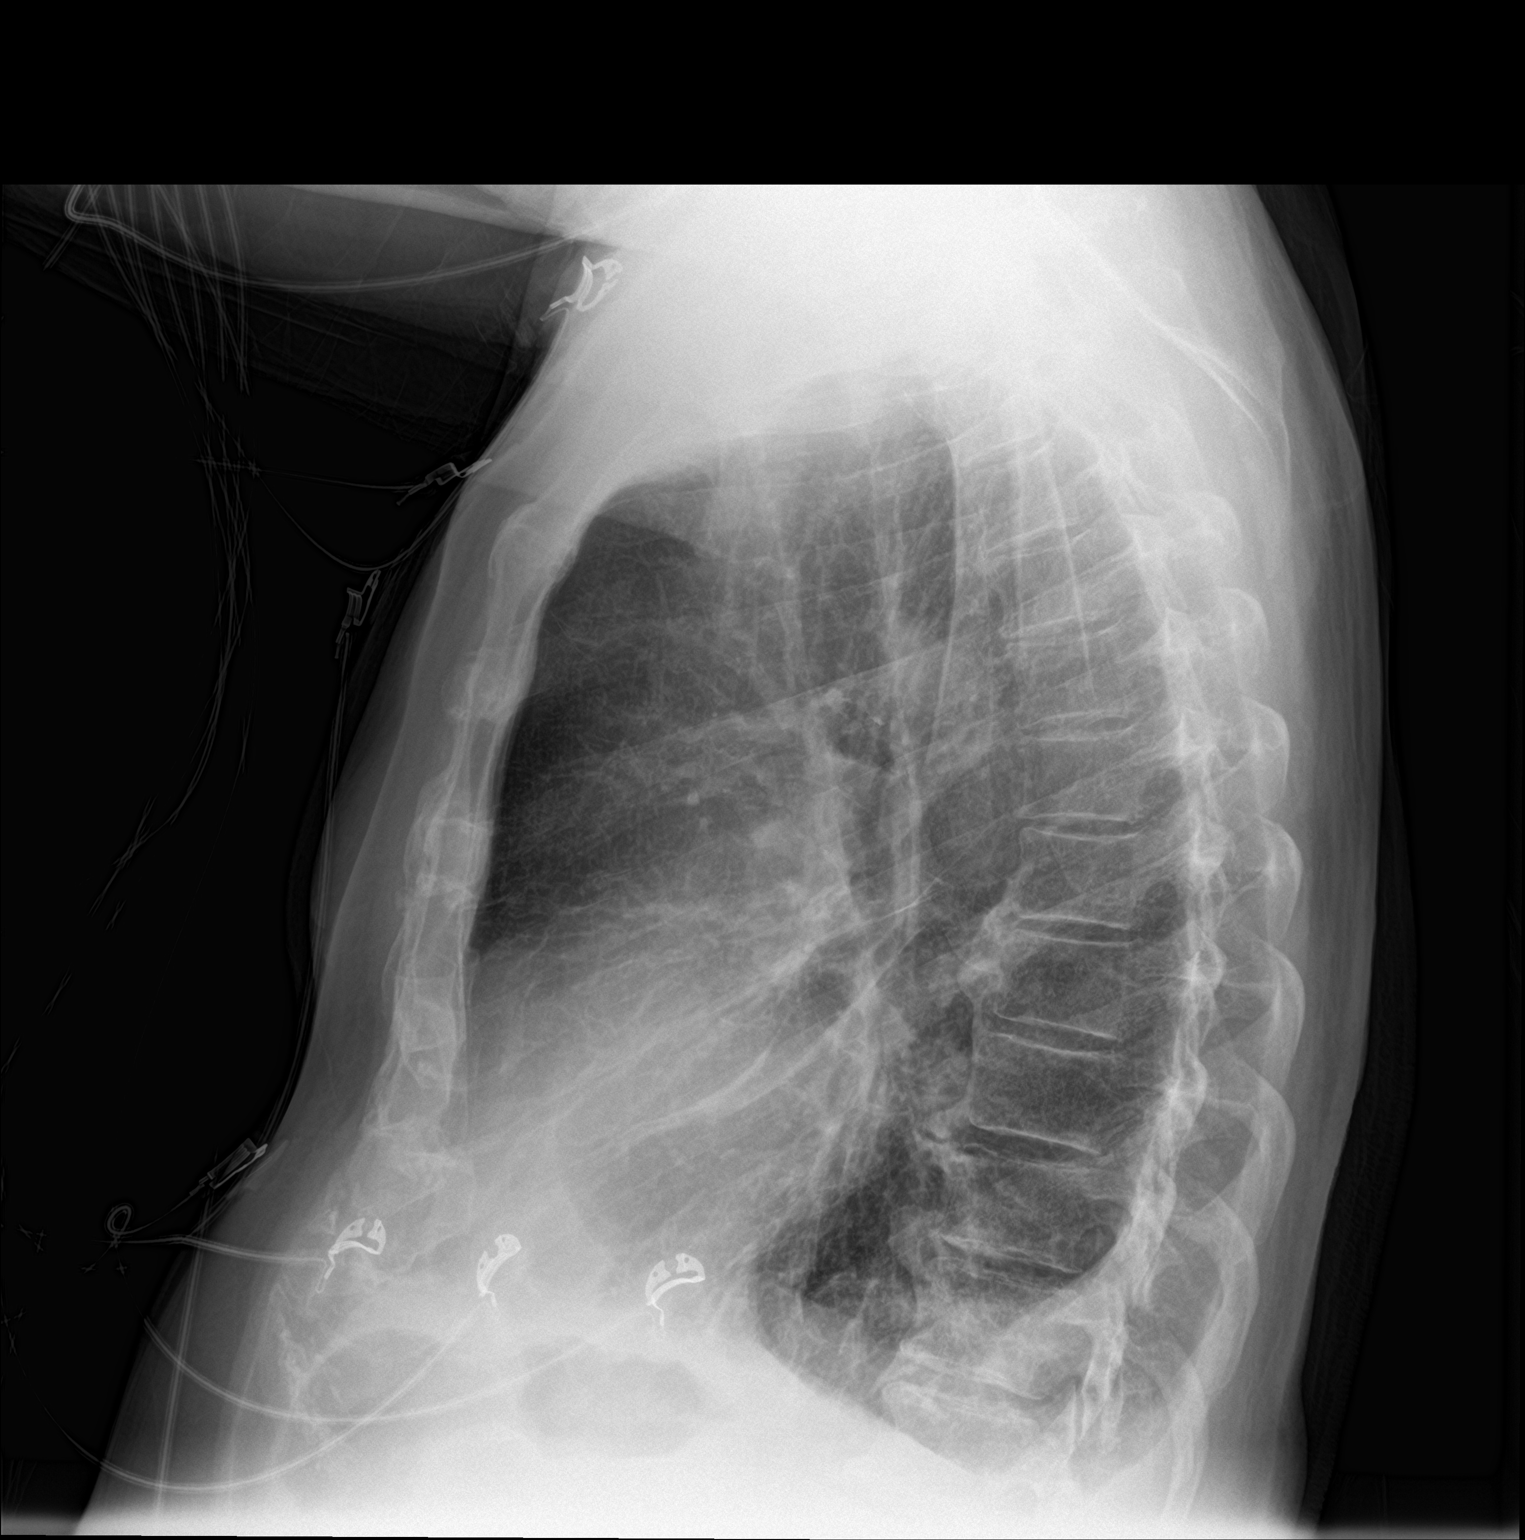

[chest pa]
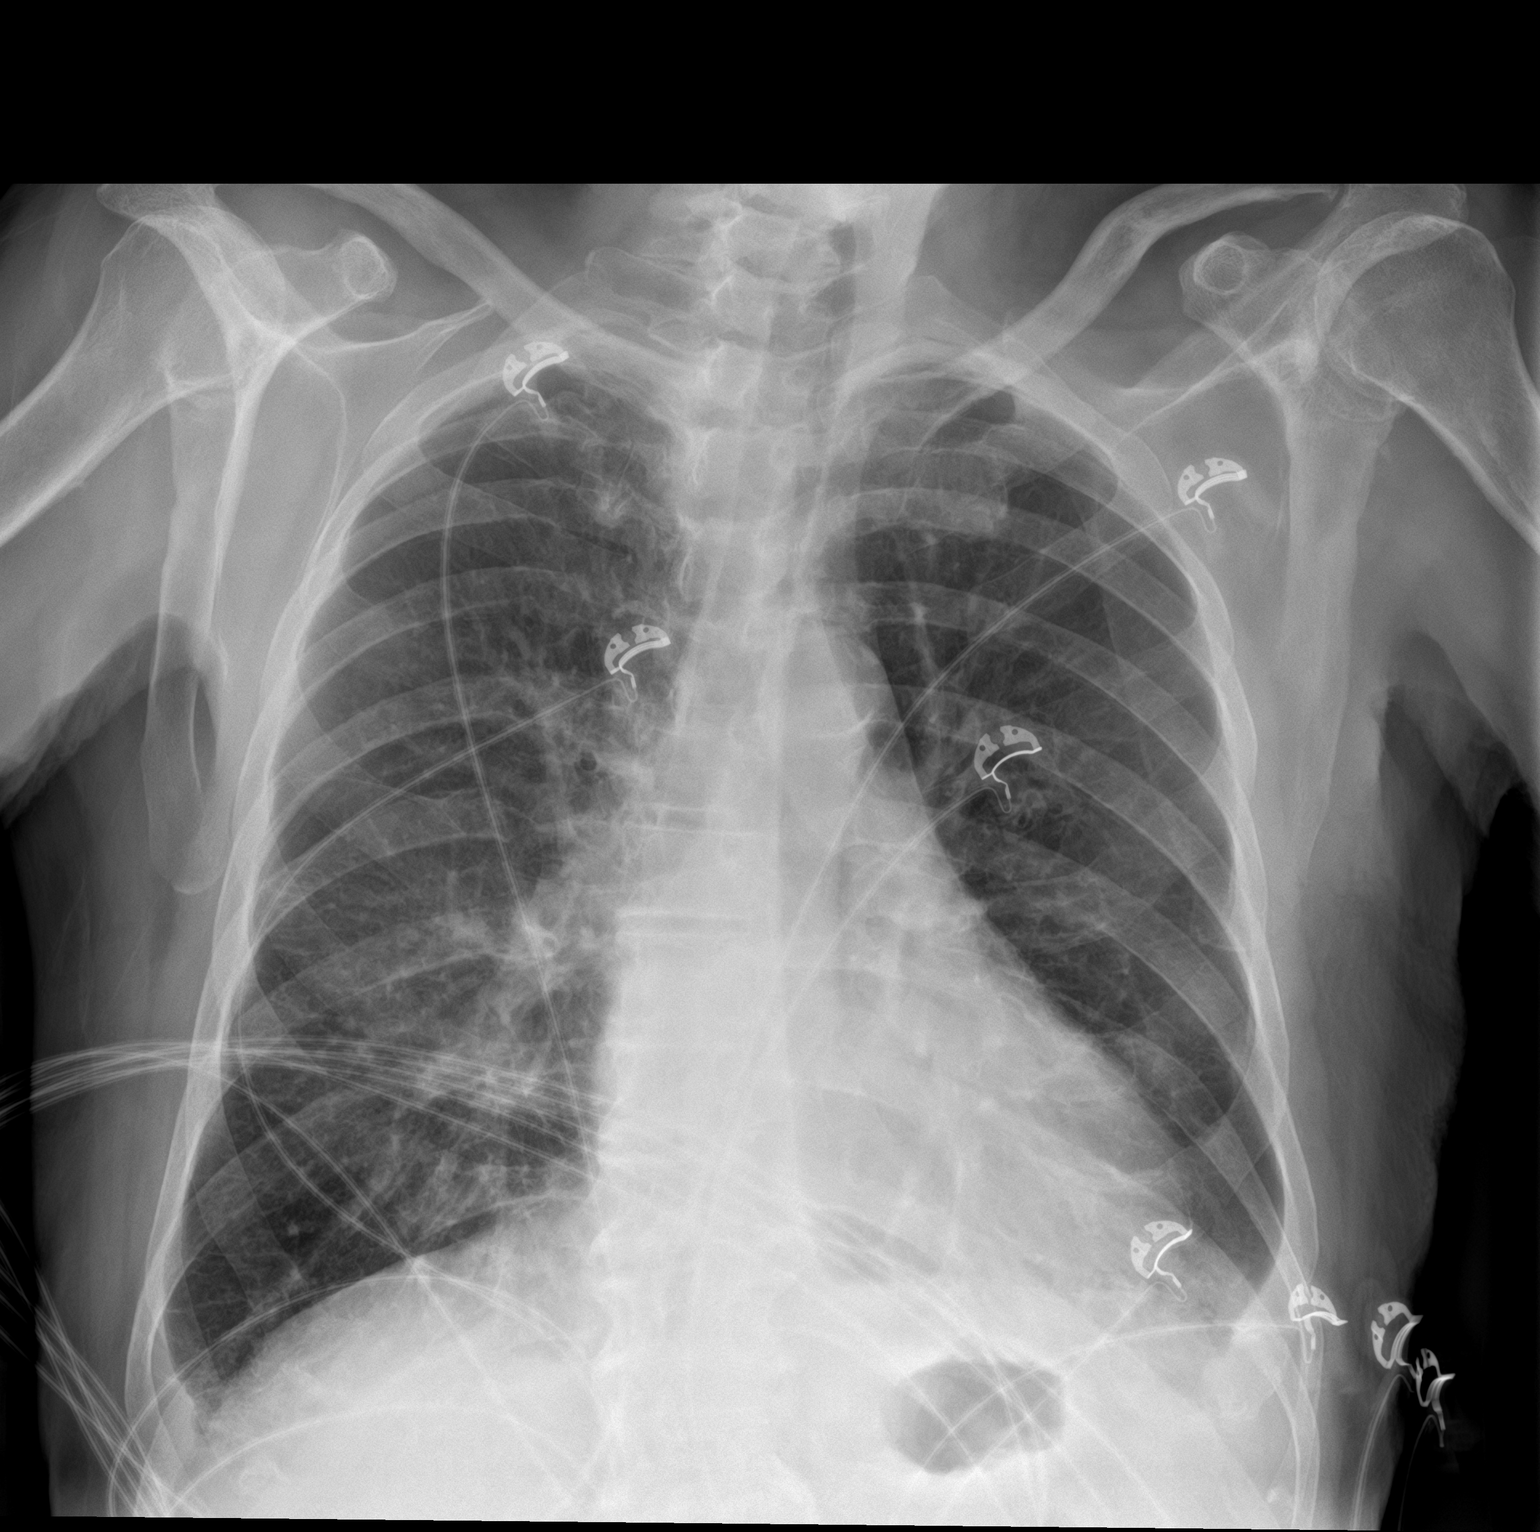

[2 of 2 positions shown; findings below may reference images not displayed]

FINDINGS: Lungs are adequately inflated with mild left basilar opacification
which may be due to small effusion with atelectasis although
infection is possible. Remainder of the lungs are clear.
Cardiomediastinal silhouette is normal. Degenerative change of the
spine.
IMPRESSION: Mild left basilar opacification which may be due to small effusion
with atelectasis, although infection is possible.

## 2022-04-15 IMAGING — US US ABDOMEN LIMITED RUQ/ASCITES
1 series · 14 of 25 positions shown · non-contrast
Comparison: None.

CLINICAL DATA: Transaminitis

EXAM:
ULTRASOUND ABDOMEN LIMITED RIGHT UPPER QUADRANT

[Series 1: us abdomen complete · 14 of 45 slices shown]
[im 1/45]
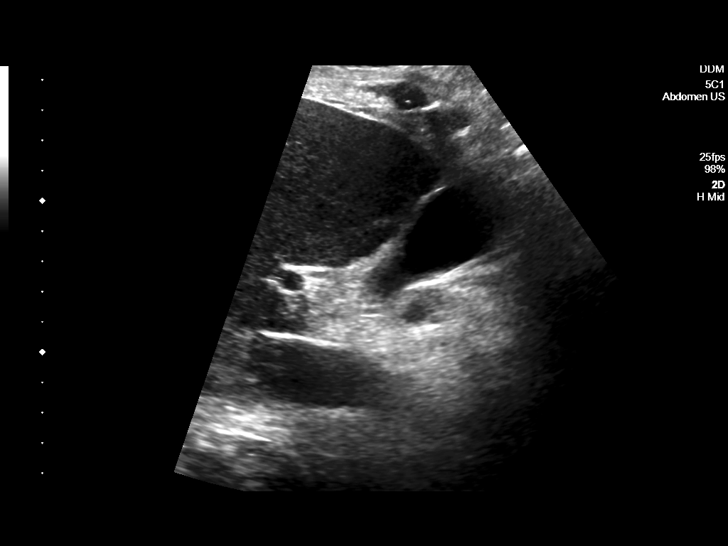
[im 4/45]
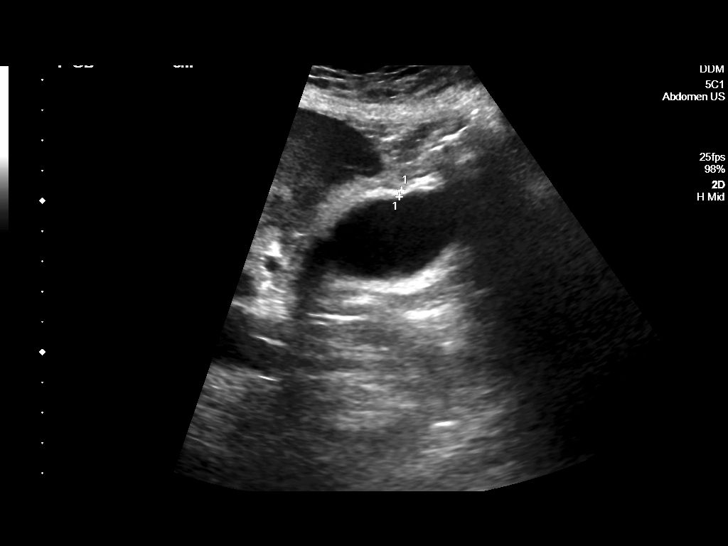
[im 8/45]
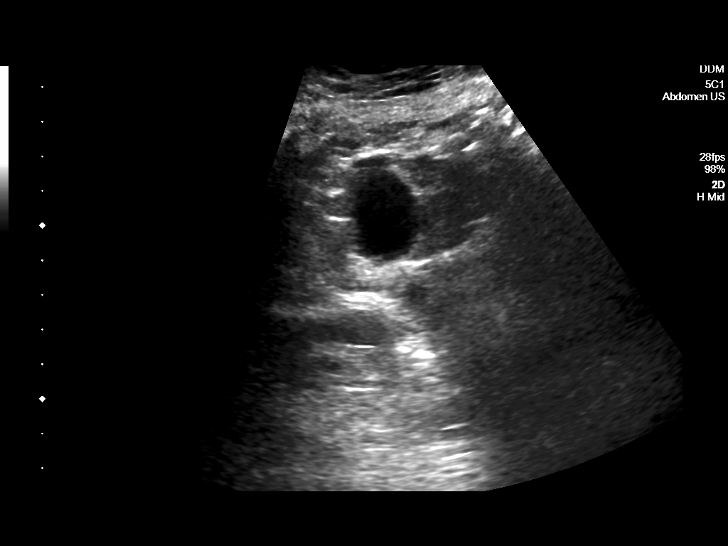
[im 12/45]
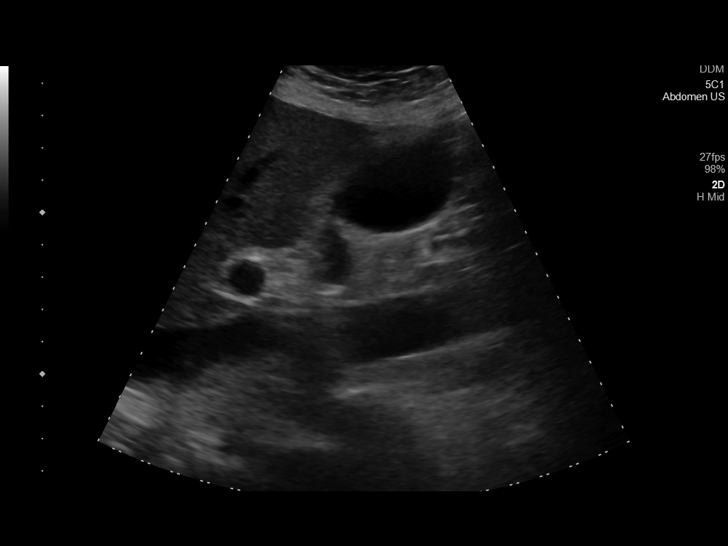
[im 15/45]
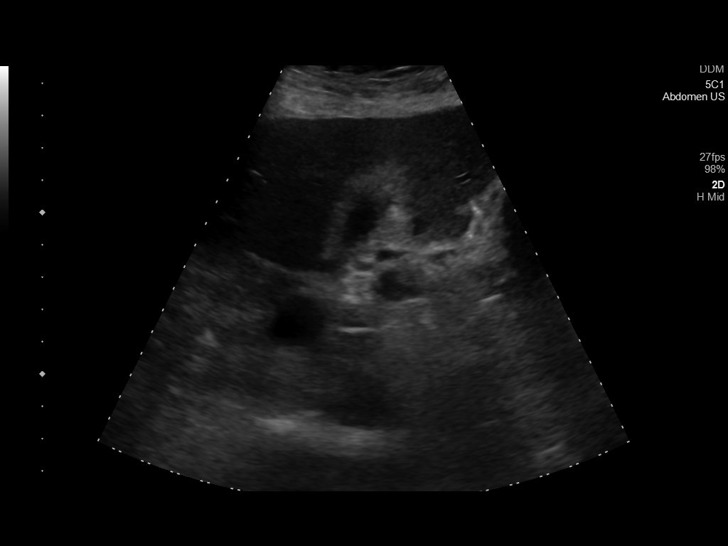
[im 17/45]
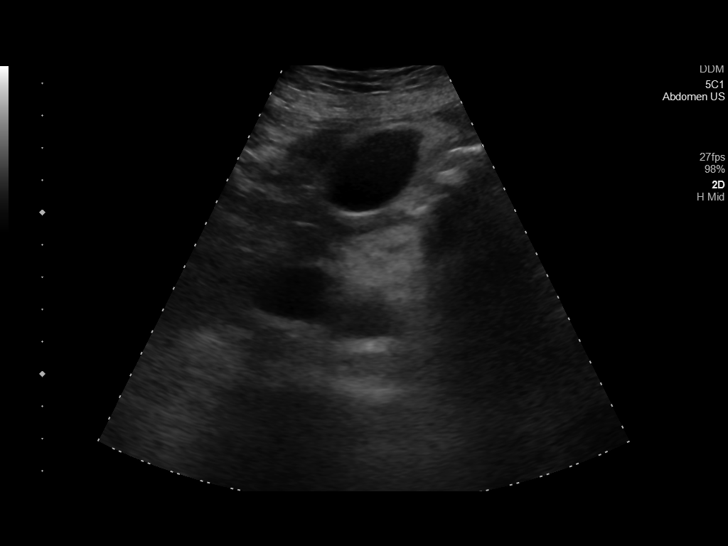
[im 21/45]
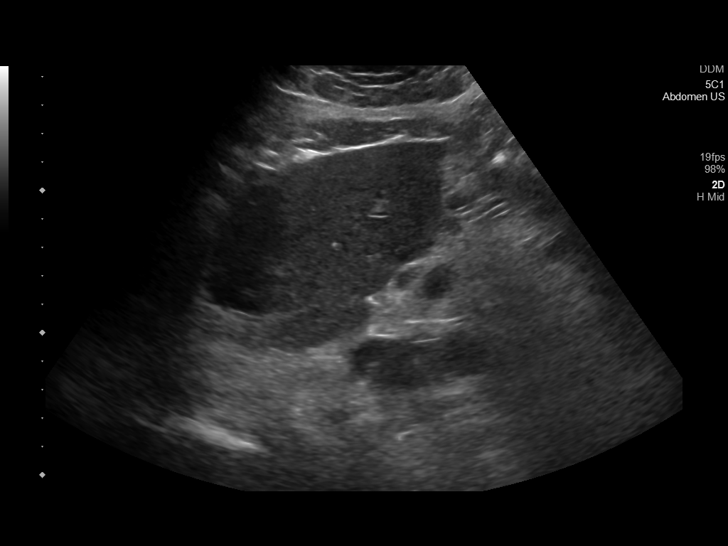
[im 24/45]
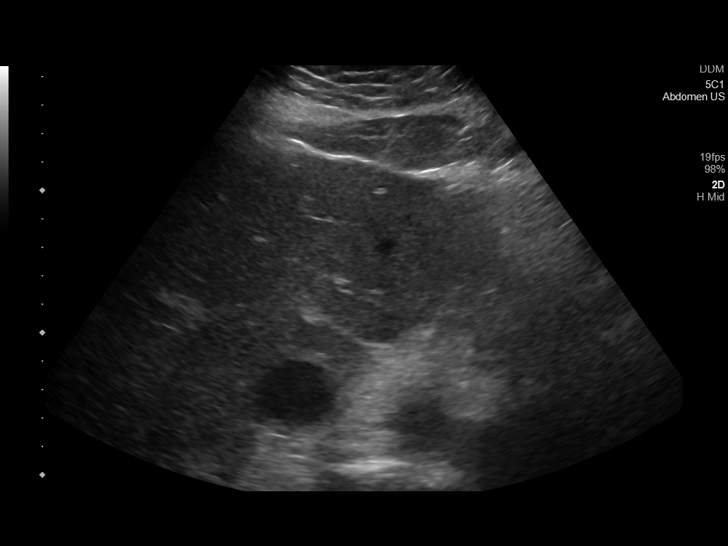
[im 28/45]
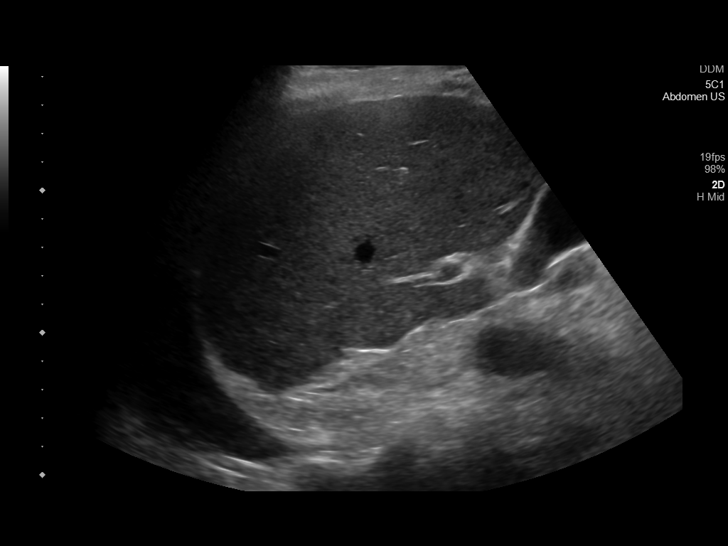
[im 30/45]
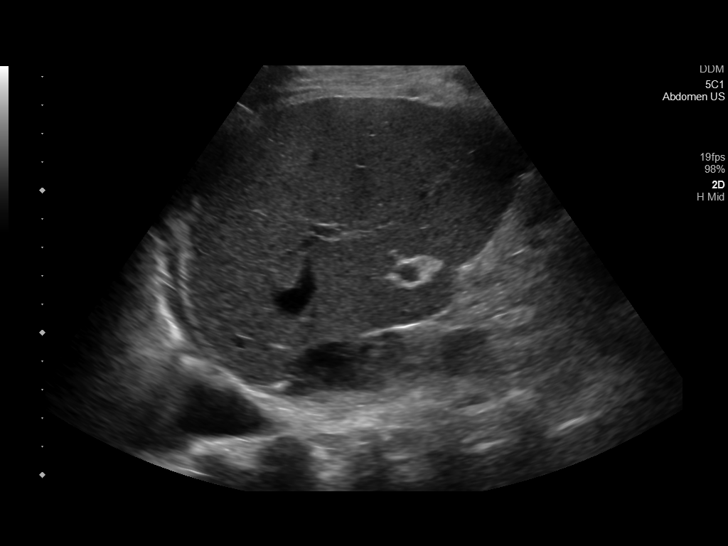
[im 34/45]
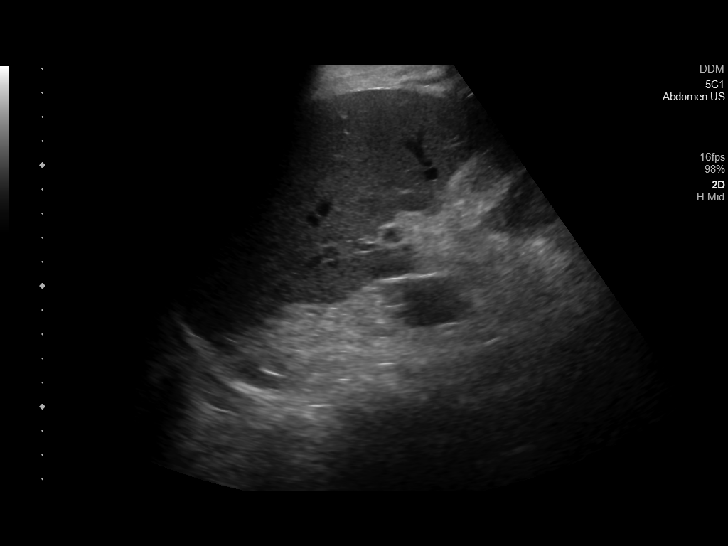
[im 37/45]
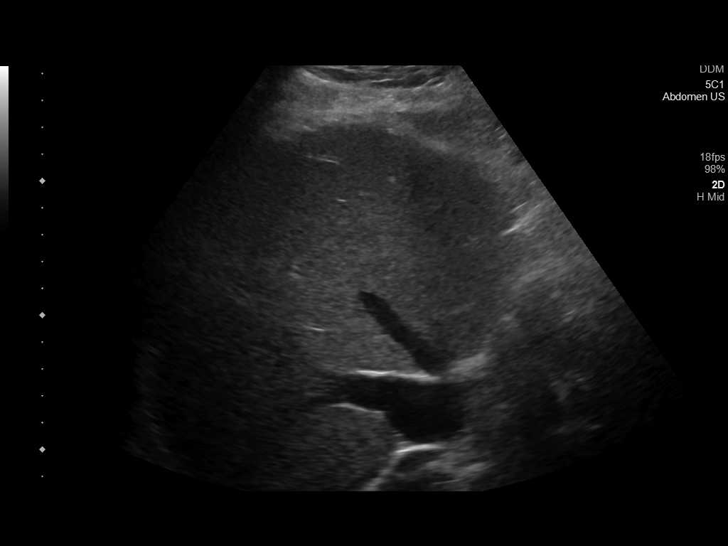
[im 41/45]
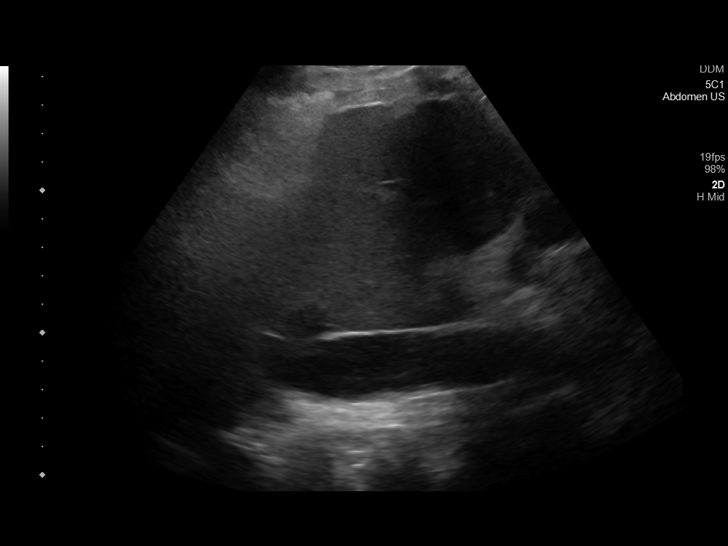
[im 45/45]
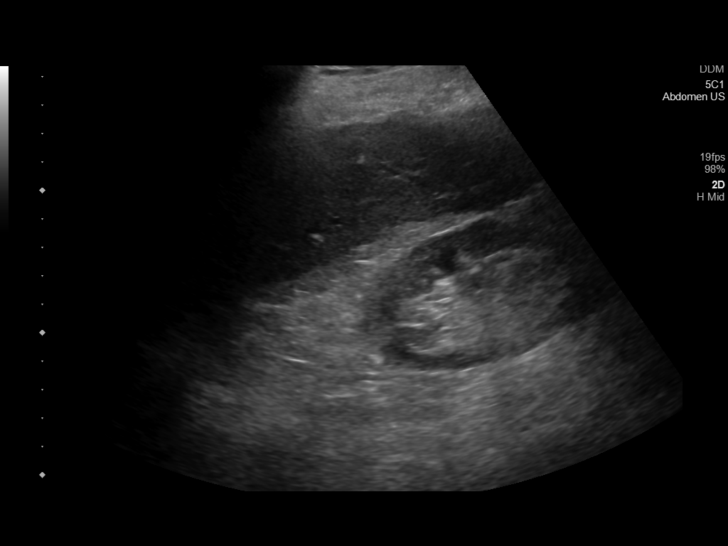

[14 of 25 positions shown; findings below may reference images not displayed]

FINDINGS: Gallbladder:

No gallstones or wall thickening visualized. No sonographic Murphy
sign noted by sonographer.

Common bile duct:

Diameter: 2.9 mm

Liver:

No focal lesion identified. Within normal limits in parenchymal
echogenicity. Portal vein is patent on color Doppler imaging with
normal direction of blood flow towards the liver.

Other: None.
IMPRESSION: 1. Right pleural effusion.
2. The gallbladder, common bile duct, and liver are unremarkable.

## 2022-04-18 IMAGING — DX DG CHEST 1V PORT
1 series · 1 of 1 positions shown · non-contrast
Comparison: PA and lateral chest 05/27/2020.

CLINICAL DATA: Cough. Sensation of medication being stuck since
taking it this morning.

EXAM:
PORTABLE CHEST 1 VIEW

[chest ap]
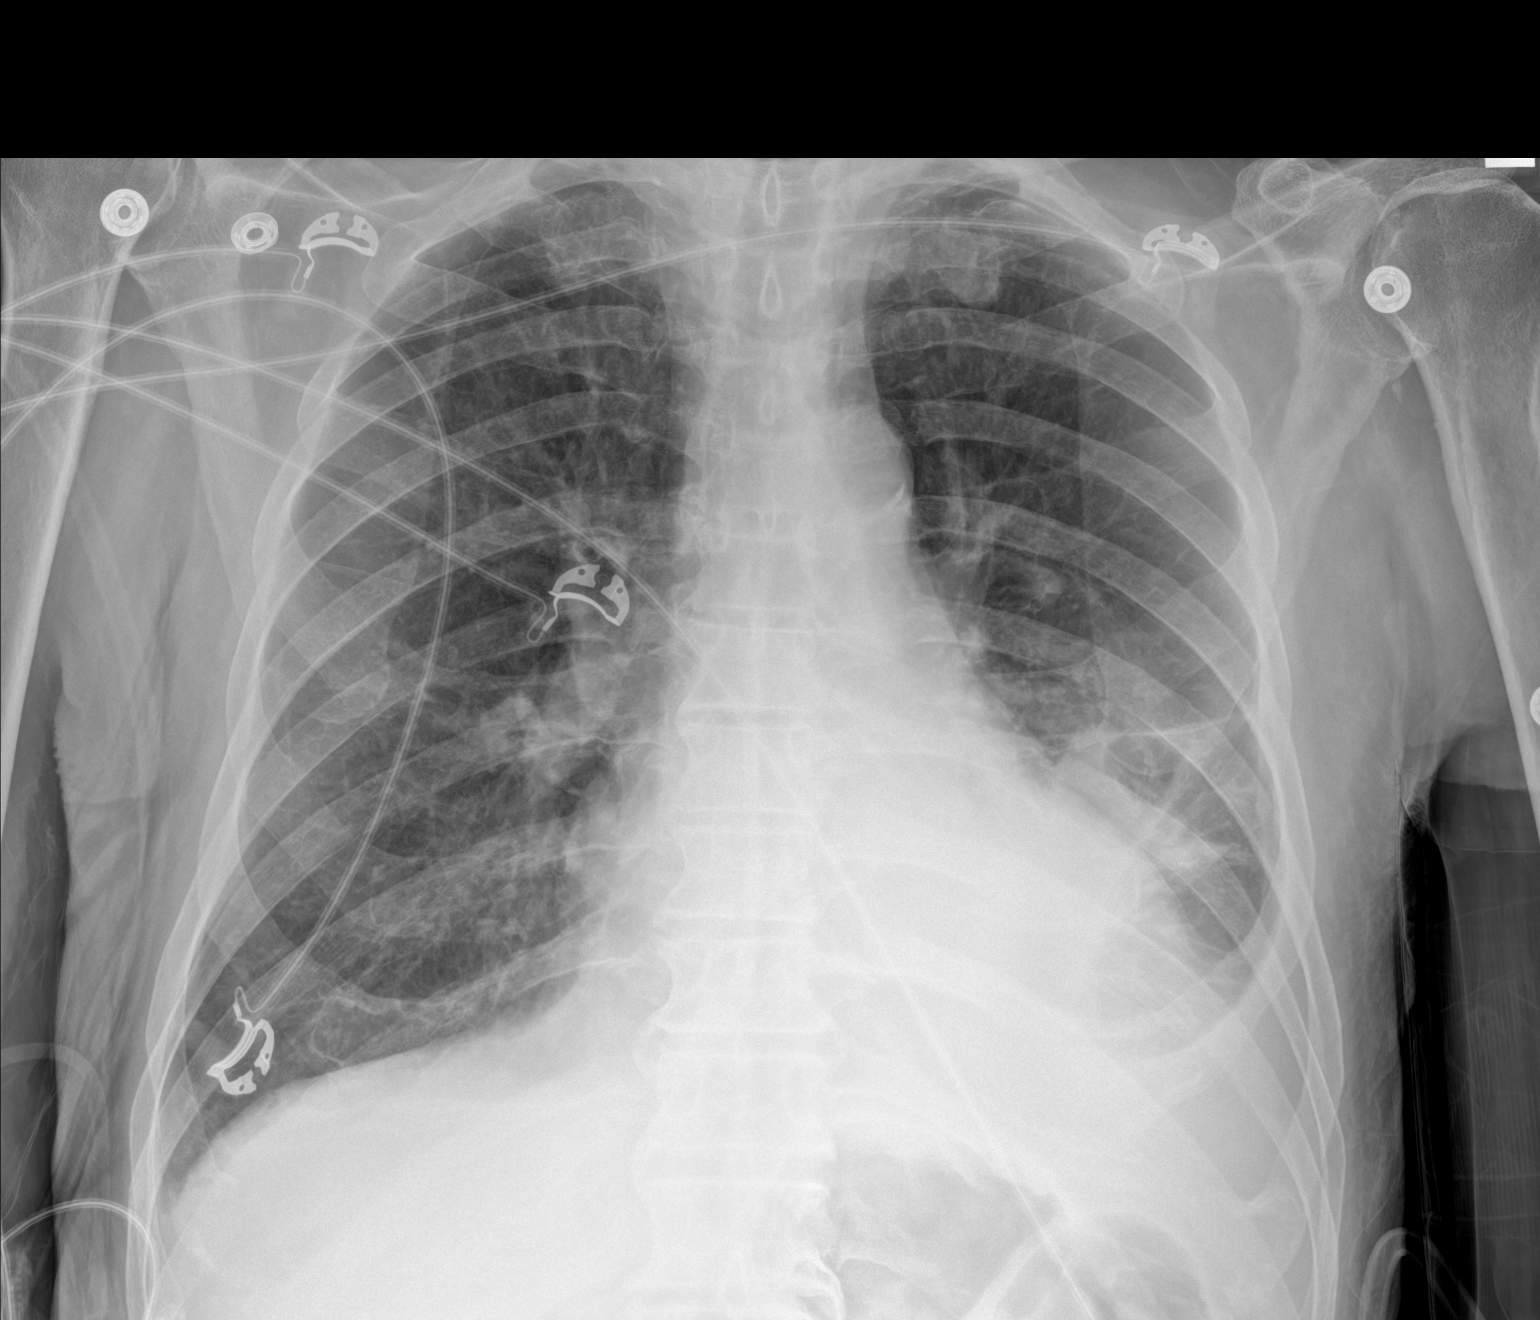

[1 of 1 positions shown; findings below may reference images not displayed]

FINDINGS: Left pleural effusion and basilar airspace disease are improved.
Right lung is clear. Heart size is normal. No pneumothorax. Aortic
atherosclerosis noted. No radiopaque foreign body is seen. No acute
bony abnormality.
IMPRESSION: Decreased left pleural effusion and basilar airspace disease. No new
abnormality.
